# Patient Record
Sex: Male | Born: 1945
Health system: Southern US, Community
[De-identification: ages and names within clinical notes are randomized; demographics above are authoritative.]

## PROBLEM LIST (undated history)

## (undated) DIAGNOSIS — G4730 Sleep apnea, unspecified: Secondary | ICD-10-CM

## (undated) DIAGNOSIS — G4733 Obstructive sleep apnea (adult) (pediatric): Secondary | ICD-10-CM

## (undated) DIAGNOSIS — E8881 Metabolic syndrome: Secondary | ICD-10-CM

## (undated) DIAGNOSIS — N529 Male erectile dysfunction, unspecified: Secondary | ICD-10-CM

## (undated) DIAGNOSIS — G4700 Insomnia, unspecified: Secondary | ICD-10-CM

## (undated) DIAGNOSIS — R21 Rash and other nonspecific skin eruption: Secondary | ICD-10-CM

## (undated) DIAGNOSIS — Z9989 Dependence on other enabling machines and devices: Secondary | ICD-10-CM

## (undated) DIAGNOSIS — T7840XA Allergy, unspecified, initial encounter: Secondary | ICD-10-CM

## (undated) DIAGNOSIS — I10 Essential (primary) hypertension: Secondary | ICD-10-CM

## (undated) DIAGNOSIS — R519 Headache, unspecified: Secondary | ICD-10-CM

## (undated) DIAGNOSIS — R42 Dizziness and giddiness: Secondary | ICD-10-CM

## (undated) DIAGNOSIS — K219 Gastro-esophageal reflux disease without esophagitis: Secondary | ICD-10-CM

## (undated) DIAGNOSIS — B351 Tinea unguium: Secondary | ICD-10-CM

## (undated) DIAGNOSIS — R51 Headache: Secondary | ICD-10-CM

## (undated) DIAGNOSIS — E785 Hyperlipidemia, unspecified: Secondary | ICD-10-CM

## (undated) DIAGNOSIS — Z972 Presence of dental prosthetic device (complete) (partial): Secondary | ICD-10-CM

## (undated) HISTORY — PX: CARDIAC CATHETERIZATION: SHX172

## (undated) HISTORY — DX: Metabolic syndrome: E88.810

## (undated) HISTORY — DX: Essential (primary) hypertension: I10

## (undated) HISTORY — DX: Hyperlipidemia, unspecified: E78.5

## (undated) HISTORY — DX: Obstructive sleep apnea (adult) (pediatric): G47.33

## (undated) HISTORY — DX: Sleep apnea, unspecified: G47.30

## (undated) HISTORY — DX: Metabolic syndrome: E88.81

## (undated) HISTORY — DX: Dizziness and giddiness: R42

## (undated) HISTORY — DX: Allergy, unspecified, initial encounter: T78.40XA

## (undated) HISTORY — DX: Headache: R51

## (undated) HISTORY — DX: Rash and other nonspecific skin eruption: R21

## (undated) HISTORY — DX: Male erectile dysfunction, unspecified: N52.9

## (undated) HISTORY — DX: Headache, unspecified: R51.9

## (undated) HISTORY — PX: COLONOSCOPY: SHX174

## (undated) HISTORY — DX: Tinea unguium: B35.1

## (undated) HISTORY — DX: Insomnia, unspecified: G47.00

## (undated) HISTORY — DX: Dependence on other enabling machines and devices: Z99.89

## (undated) HISTORY — DX: Gastro-esophageal reflux disease without esophagitis: K21.9

---

## 2005-10-05 ENCOUNTER — Ambulatory Visit: Payer: Self-pay | Admitting: Family Medicine

## 2006-02-01 ENCOUNTER — Ambulatory Visit: Payer: Self-pay | Admitting: Family Medicine

## 2006-03-16 ENCOUNTER — Ambulatory Visit: Payer: Self-pay | Admitting: Gastroenterology

## 2006-06-06 ENCOUNTER — Ambulatory Visit: Payer: Self-pay | Admitting: Family Medicine

## 2006-09-06 ENCOUNTER — Ambulatory Visit: Payer: Self-pay | Admitting: Family Medicine

## 2006-12-06 ENCOUNTER — Ambulatory Visit: Payer: Self-pay | Admitting: Family Medicine

## 2007-01-13 ENCOUNTER — Other Ambulatory Visit: Payer: Self-pay

## 2007-01-13 ENCOUNTER — Emergency Department: Payer: Self-pay | Admitting: Emergency Medicine

## 2007-01-13 IMAGING — CT CT HEAD WITHOUT CONTRAST
2 series · 16 of 30 positions shown, 20 images · non-contrast
Comparison: none

REASON FOR EXAM: Syncope
COMMENTS:

[Series 2: without · axial · non-contrast · 0.45mm/px · z∈[-198,-78]mm · 13 of 30 slices shown, 17 images]
[im 3/30  brain]
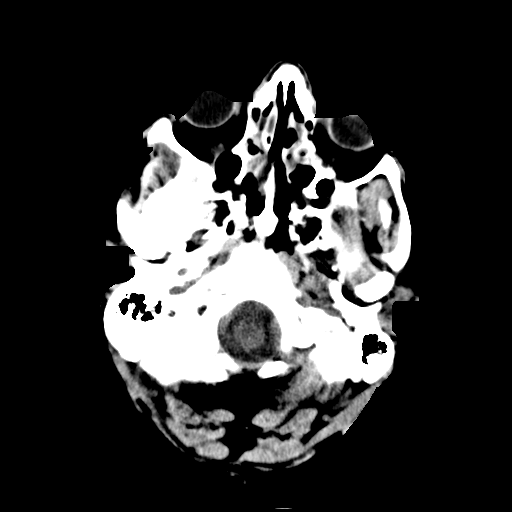
[im 3/30  bone]
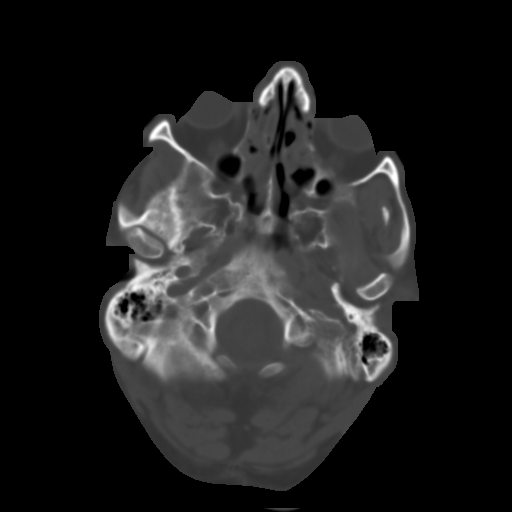
[im 5/30  brain]
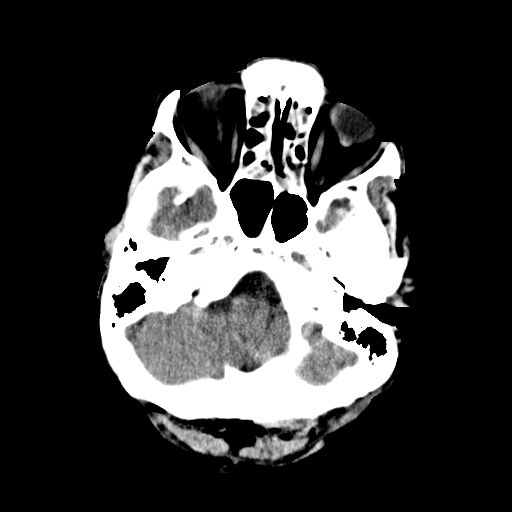
[im 7/30  brain]
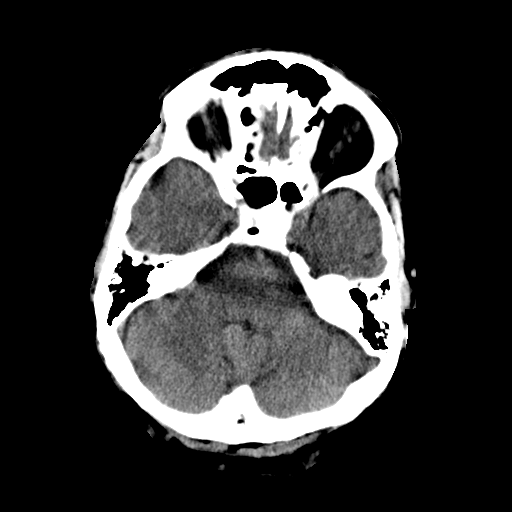
[im 9/30  brain]
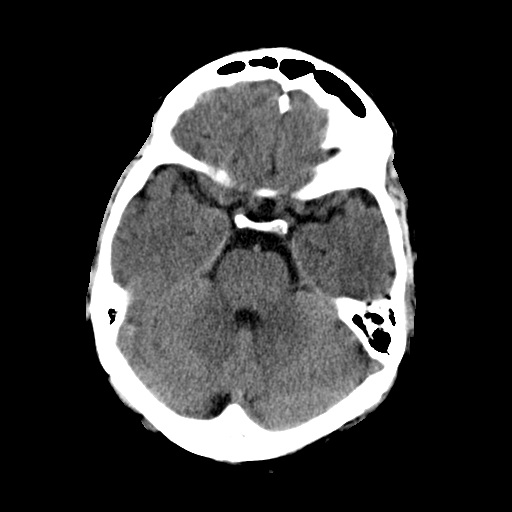
[im 11/30  brain]
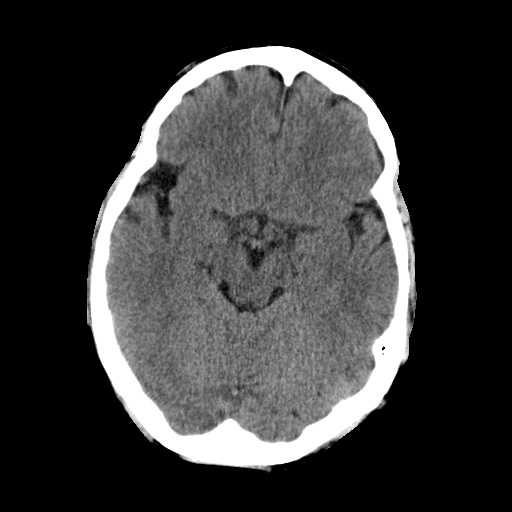
[im 11/30  bone]
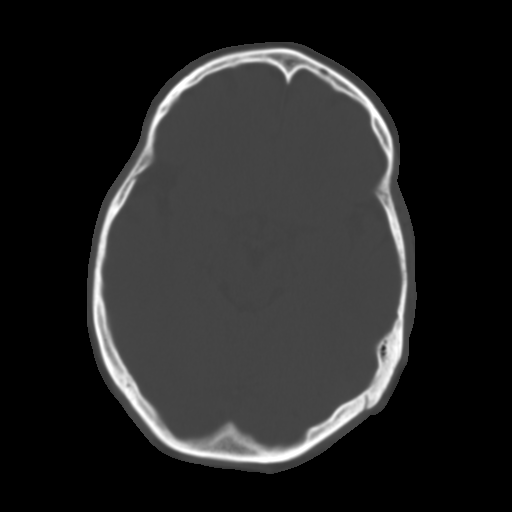
[im 13/30  brain]
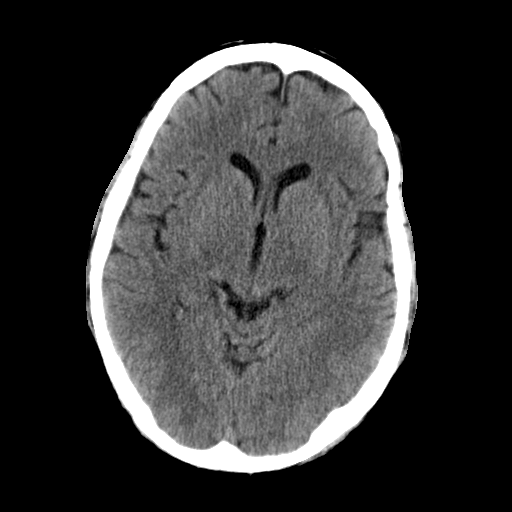
[im 15/30  brain]
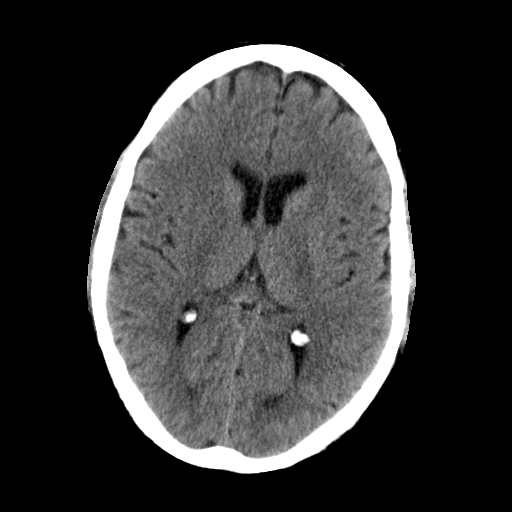
[im 17/30  brain]
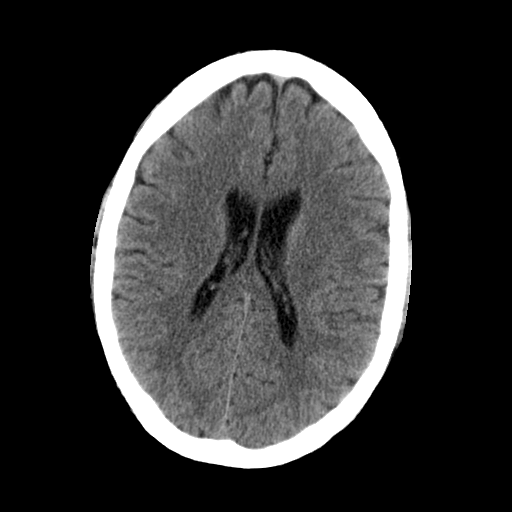
[im 19/30  brain]
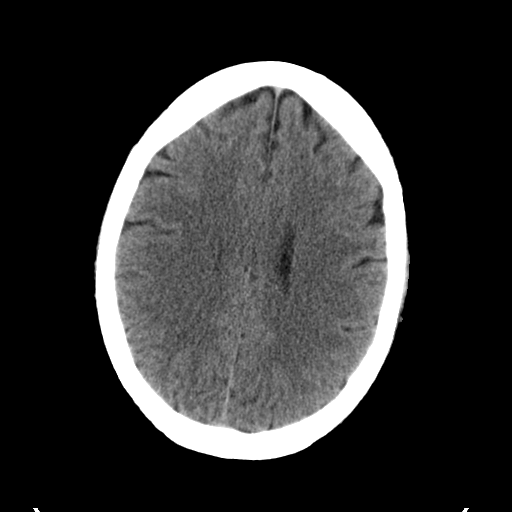
[im 19/30  bone]
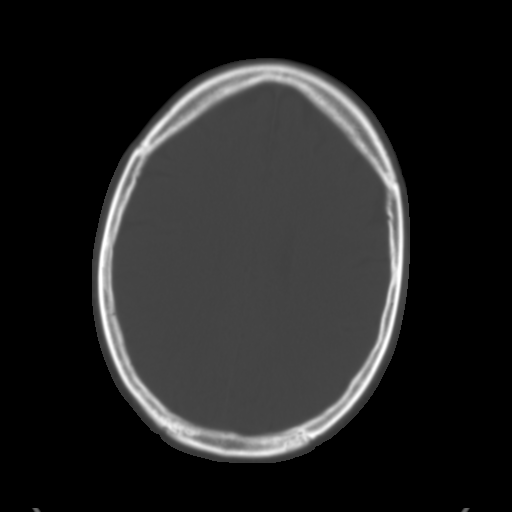
[im 21/30  brain]
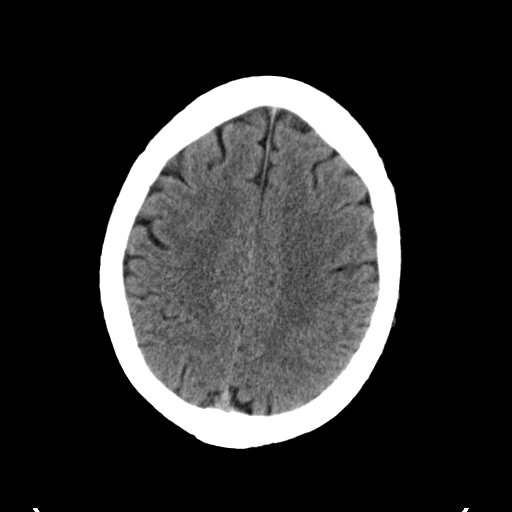
[im 23/30  brain]
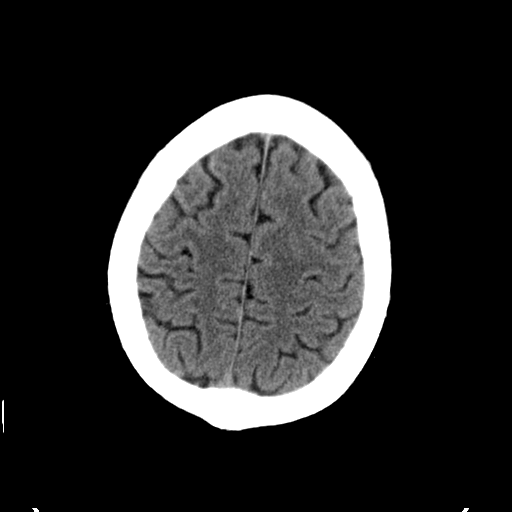
[im 25/30  brain]
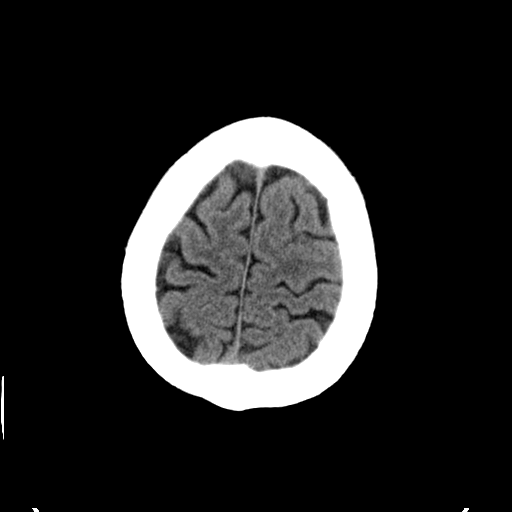
[im 27/30  brain]
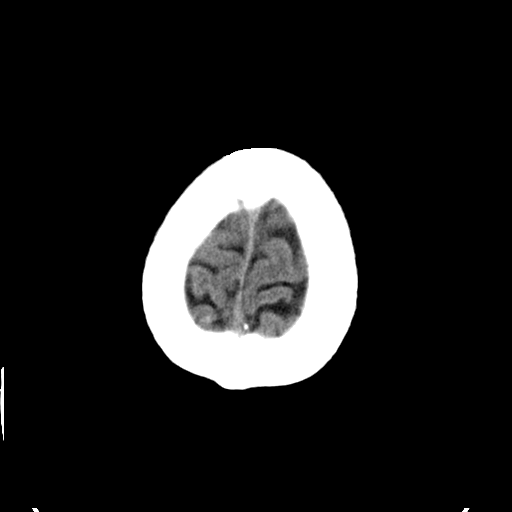
[im 27/30  bone]
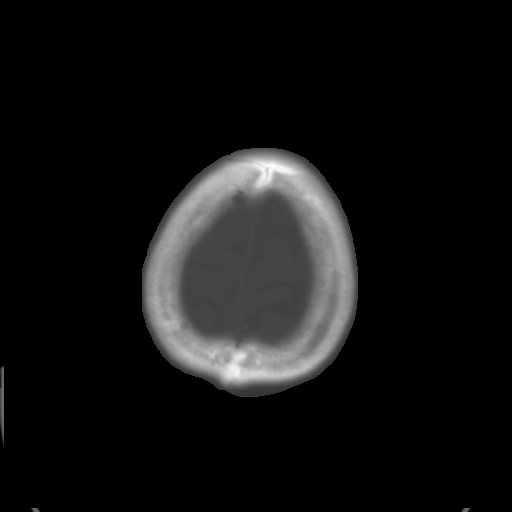

[Series 3: bone · axial · 0.45mm/px · z∈[-198,-158]mm · 3 of 30 slices shown]
[im 3/30  bone]
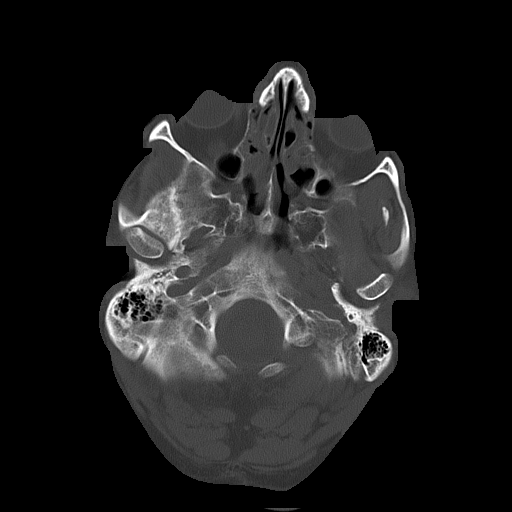
[im 7/30  bone]
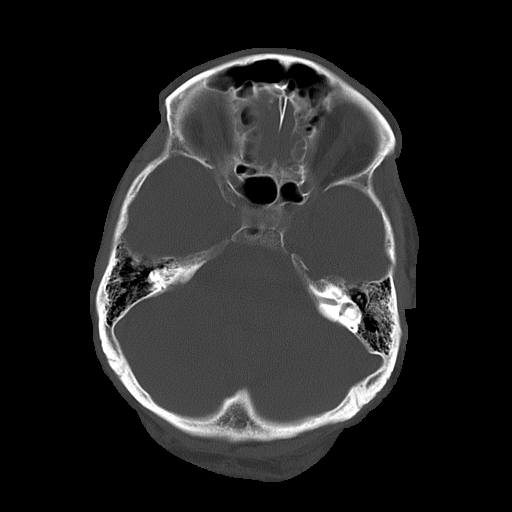
[im 11/30  bone]
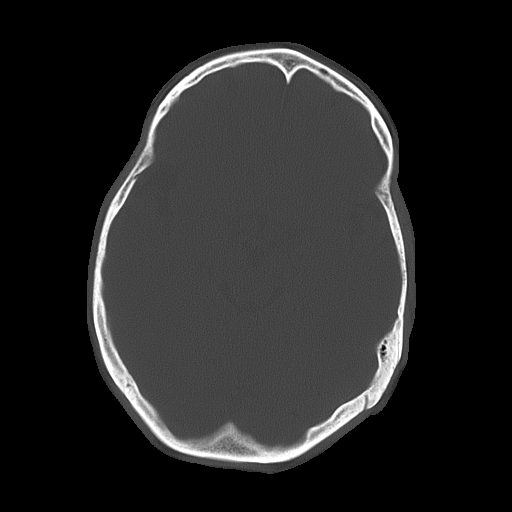

[16 of 30 positions shown; findings below may reference images not displayed]

PROCEDURE:     CT  - CT HEAD WITHOUT CONTRAST  - [DATE]  [DATE]

RESULT:     The ventricles are normal in size and position. There is no
intracranial hemorrhage, mass or mass effect. There is mucoperiosteal
thickening diffusely within the ethmoid sinuses. A small amount is seen in
the RIGHT sphenoid sinus cells. The maxillary sinuses are grossly clear
where visualized. The frontal sinuses are clear.
IMPRESSION: 1.  I see no acute intracranial abnormality.
2.  There are findings consistent with ethmoid and RIGHT sphenoid sinus
inflammation.

A preliminary report was sent to the [HOSPITAL] the conclusion
of the study.

## 2007-07-02 ENCOUNTER — Ambulatory Visit: Payer: Self-pay | Admitting: Family Medicine

## 2007-10-24 ENCOUNTER — Ambulatory Visit: Payer: Self-pay | Admitting: Family Medicine

## 2007-11-25 IMAGING — CR DG CHEST 1V PORT
1 series · 1 of 1 positions shown · non-contrast
Comparison: none

REASON FOR EXAM: syncope
COMMENTS:

[view not recorded]
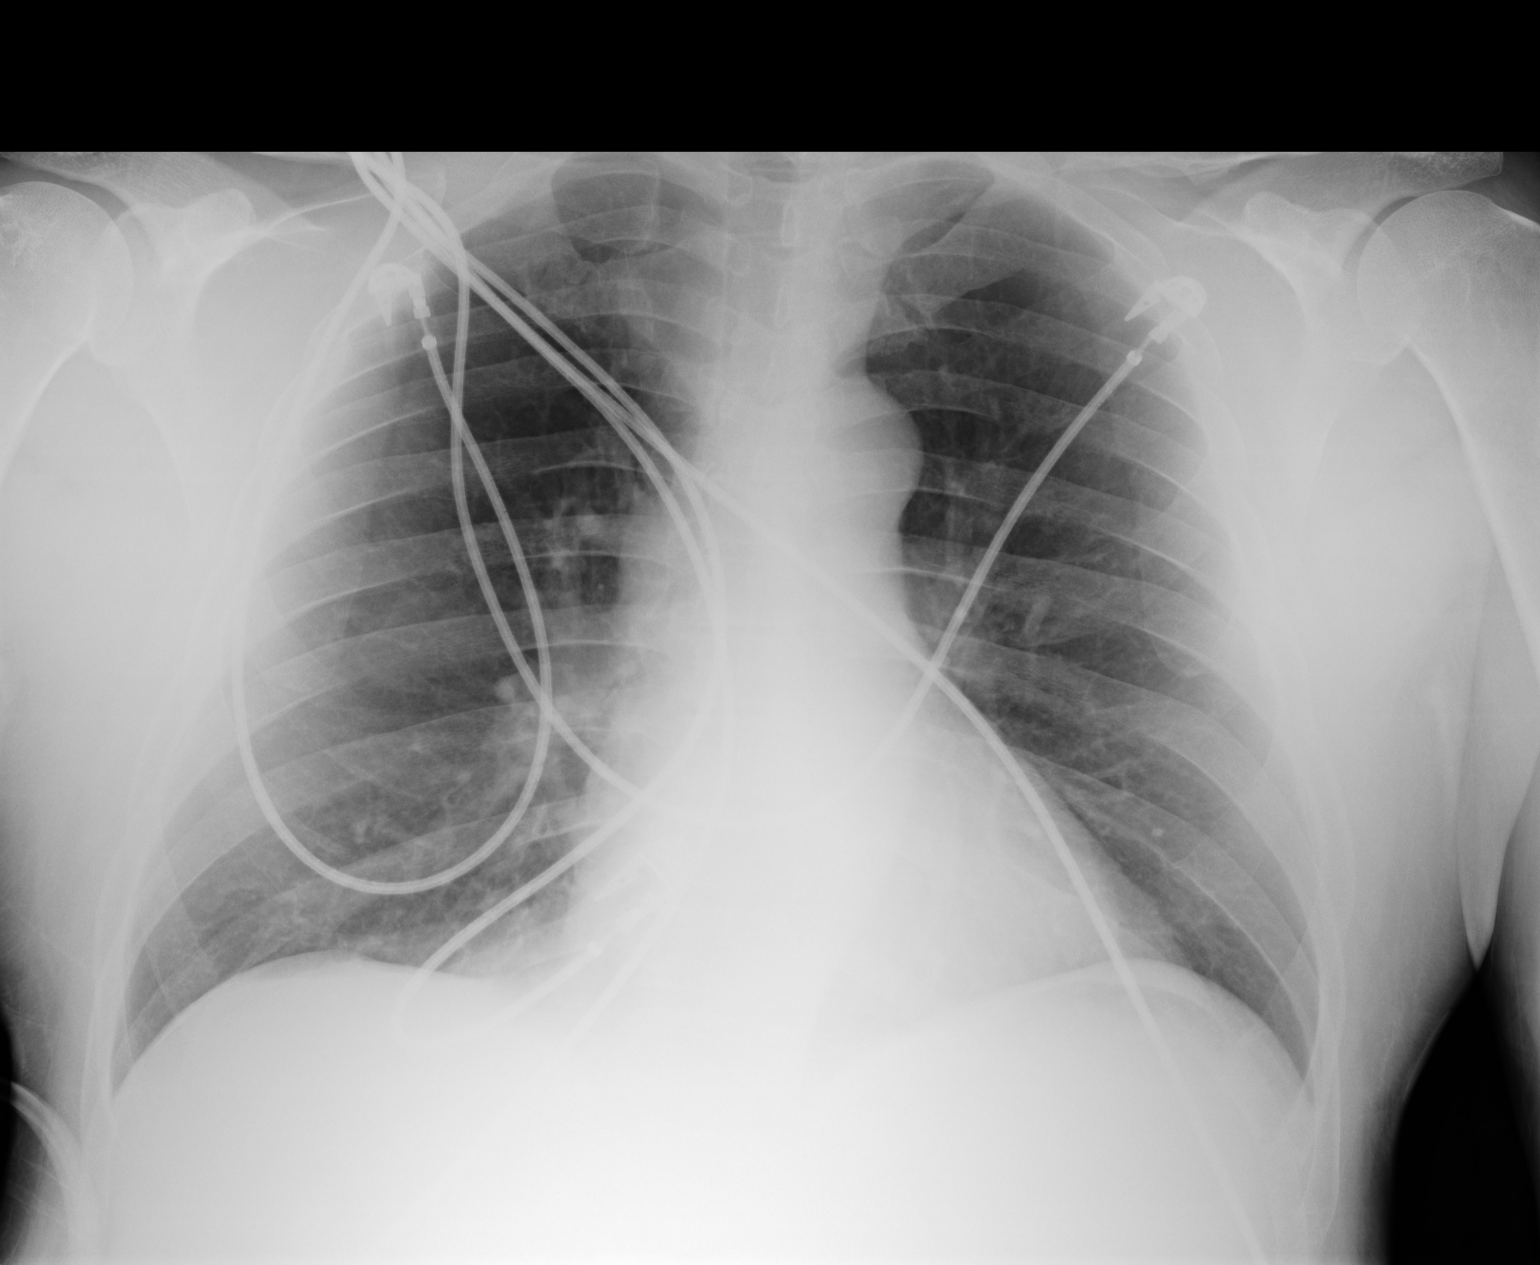

[1 of 1 positions shown; findings below may reference images not displayed]

PROCEDURE:     DXR - DXR PORTABLE CHEST SINGLE VIEW  - [DATE]  [DATE]

RESULT:     There are no prior images for comparison. Cardiac monitoring
electrodes are present. The lungs are clear. The heart and pulmonary vessels
are normal. The bony and mediastinal structures are unremarkable. There is
no effusion. There is no pneumothorax or evidence of congestive failure.
IMPRESSION: No acute cardiopulmonary disease.

## 2007-12-12 ENCOUNTER — Ambulatory Visit: Payer: Self-pay | Admitting: Gastroenterology

## 2008-01-24 ENCOUNTER — Ambulatory Visit: Payer: Self-pay | Admitting: Family Medicine

## 2008-04-29 ENCOUNTER — Ambulatory Visit: Payer: Self-pay | Admitting: Family Medicine

## 2008-06-12 ENCOUNTER — Emergency Department: Payer: Self-pay | Admitting: Emergency Medicine

## 2008-06-12 IMAGING — CR DG CHEST 1V PORT
1 series · 1 of 1 positions shown · non-contrast
Comparison: none

REASON FOR EXAM: chest pain
COMMENTS:

PROCEDURE:     DXR - DXR PORTABLE CHEST SINGLE VIEW  - [DATE] [DATE]
RESULT:     Comparison is made to prior study dated [DATE].
The lungs are clear. The cardiac silhouette and visualized bony skeleton are
unremarkable.

[view not recorded]
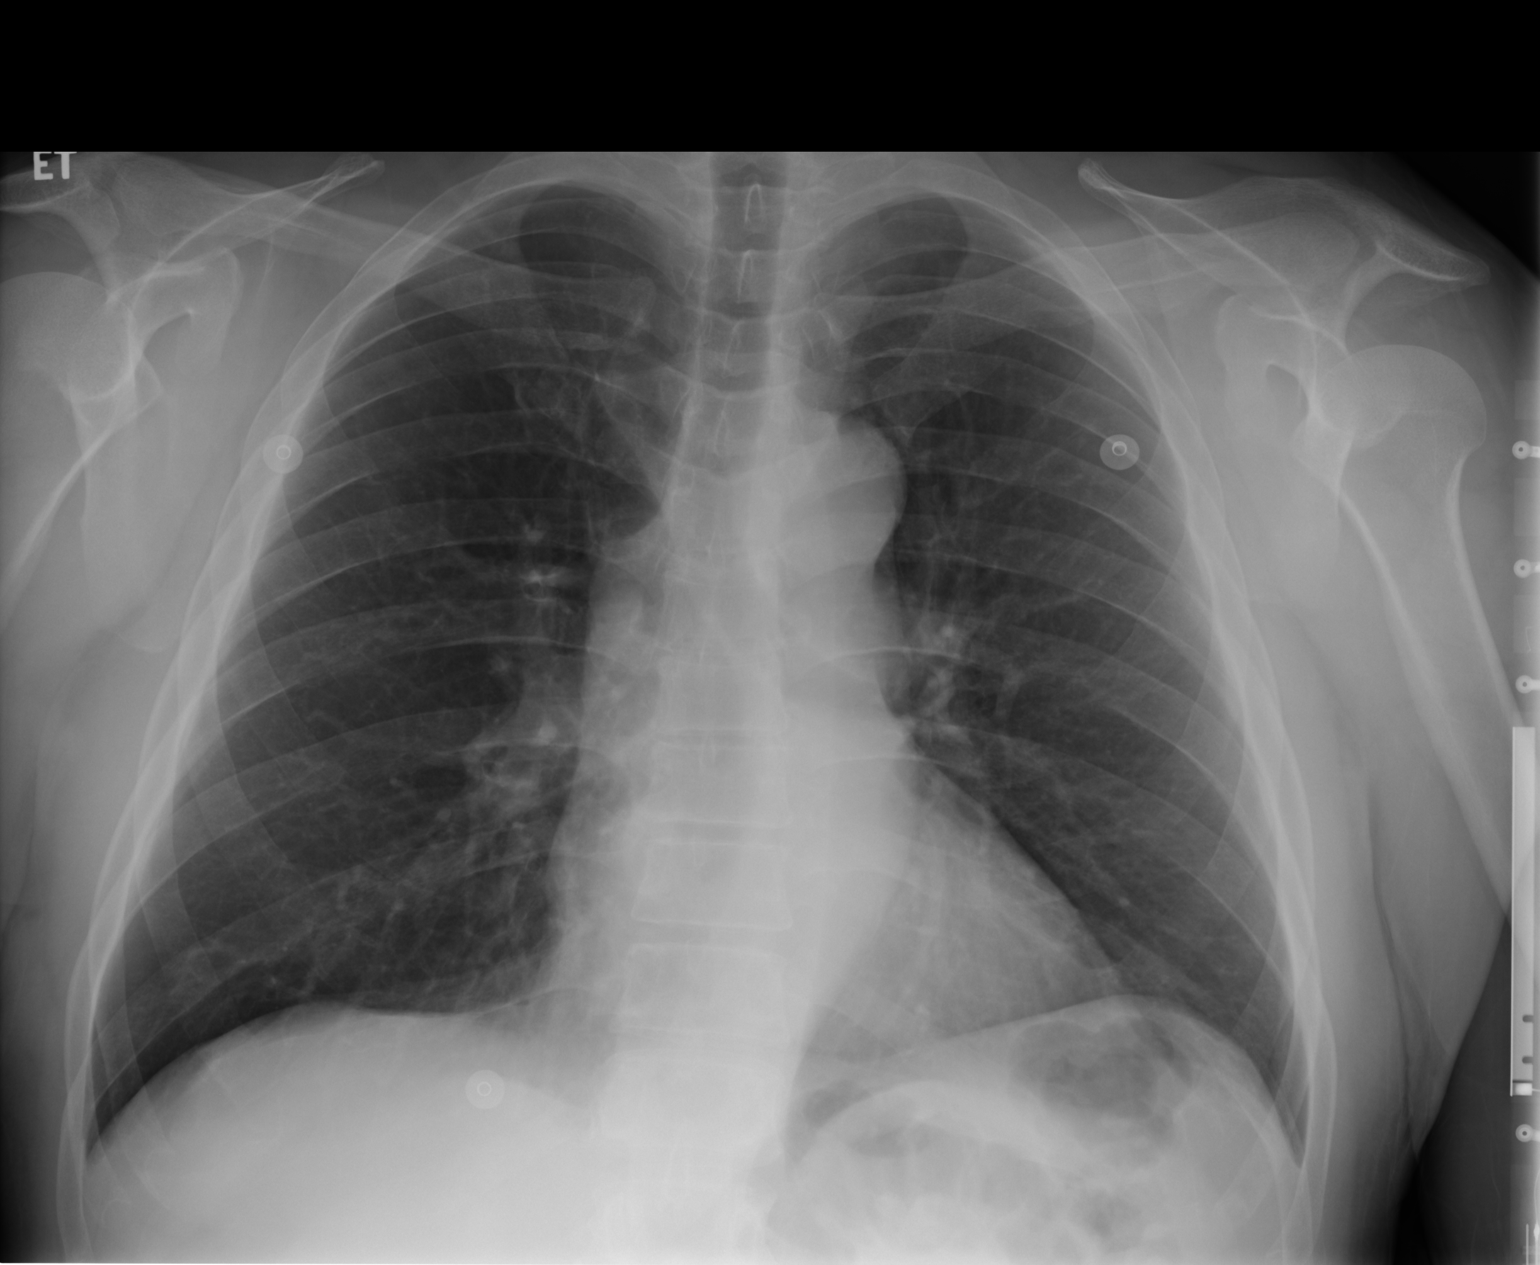

[1 of 1 positions shown; findings below may reference images not displayed]

IMPRESSION: 1. Chest radiograph without evidence of acute cardiopulmonary disease.

## 2008-07-24 ENCOUNTER — Ambulatory Visit: Payer: Self-pay | Admitting: Cardiovascular Disease

## 2008-12-07 ENCOUNTER — Other Ambulatory Visit: Payer: Self-pay | Admitting: Family Medicine

## 2009-04-13 ENCOUNTER — Other Ambulatory Visit: Payer: Self-pay | Admitting: Family Medicine

## 2009-10-13 ENCOUNTER — Other Ambulatory Visit: Payer: Self-pay | Admitting: Family Medicine

## 2010-02-16 ENCOUNTER — Other Ambulatory Visit: Payer: Self-pay | Admitting: Family Medicine

## 2010-05-27 ENCOUNTER — Other Ambulatory Visit: Payer: Self-pay | Admitting: Family Medicine

## 2010-11-02 ENCOUNTER — Other Ambulatory Visit: Payer: Self-pay | Admitting: Family Medicine

## 2010-11-25 ENCOUNTER — Ambulatory Visit: Payer: Self-pay | Admitting: Internal Medicine

## 2010-11-27 ENCOUNTER — Ambulatory Visit: Payer: Self-pay | Admitting: Internal Medicine

## 2010-12-28 ENCOUNTER — Ambulatory Visit: Payer: Self-pay | Admitting: Gastroenterology

## 2010-12-28 ENCOUNTER — Ambulatory Visit: Payer: Self-pay

## 2010-12-28 ENCOUNTER — Ambulatory Visit: Payer: Self-pay | Admitting: Internal Medicine

## 2010-12-28 LAB — HM COLONOSCOPY: HM Colonoscopy: NORMAL

## 2010-12-30 LAB — PATHOLOGY REPORT

## 2011-03-07 ENCOUNTER — Ambulatory Visit: Payer: Self-pay | Admitting: Internal Medicine

## 2011-03-27 ENCOUNTER — Ambulatory Visit: Payer: Self-pay | Admitting: Gastroenterology

## 2011-03-30 ENCOUNTER — Ambulatory Visit: Payer: Self-pay | Admitting: Internal Medicine

## 2011-07-28 ENCOUNTER — Other Ambulatory Visit: Payer: Self-pay | Admitting: Family Medicine

## 2011-07-28 LAB — CBC WITH DIFFERENTIAL/PLATELET
Basophil #: 0 10*3/uL (ref 0.0–0.1)
Basophil %: 0.3 %
Eosinophil #: 0.2 10*3/uL (ref 0.0–0.7)
Eosinophil %: 2.5 %
HCT: 42.5 % (ref 40.0–52.0)
HGB: 13.7 g/dL (ref 13.0–18.0)
Lymphocyte #: 1.7 10*3/uL (ref 1.0–3.6)
Lymphocyte %: 26.1 %
MCH: 28.4 pg (ref 26.0–34.0)
MCHC: 32.3 g/dL (ref 32.0–36.0)
MCV: 88 fL (ref 80–100)
Monocyte #: 0.7 10*3/uL (ref 0.0–0.7)
Monocyte %: 10.4 %
Neutrophil #: 3.9 10*3/uL (ref 1.4–6.5)
Neutrophil %: 60.7 %
RBC: 4.83 10*6/uL (ref 4.40–5.90)
WBC: 6.4 10*3/uL (ref 3.8–10.6)

## 2011-07-28 LAB — COMPREHENSIVE METABOLIC PANEL
Albumin: 4.1 g/dL (ref 3.4–5.0)
Anion Gap: 11 (ref 7–16)
Calcium, Total: 9.5 mg/dL (ref 8.5–10.1)
Chloride: 104 mmol/L (ref 98–107)
Co2: 26 mmol/L (ref 21–32)
Creatinine: 1.03 mg/dL (ref 0.60–1.30)
EGFR (African American): >60
Osmolality: 285 (ref 275–301)
Potassium: 4.1 mmol/L (ref 3.5–5.1)
Sodium: 141 mmol/L (ref 136–145)
Total Protein: 7.8 g/dL (ref 6.4–8.2)

## 2011-07-28 LAB — LIPID PANEL
HDL Cholesterol: 65 mg/dL — ABNORMAL HIGH (ref 40–60)
Triglycerides: 50 mg/dL (ref 0–200)
VLDL Cholesterol, Calc: 10 mg/dL (ref 5–40)

## 2011-07-28 LAB — HEMOGLOBIN A1C: Hemoglobin A1C: 6.1 % (ref 4.2–6.3)

## 2012-04-01 HISTORY — PX: HEMORRHOID BANDING: SHX5850

## 2012-08-29 ENCOUNTER — Other Ambulatory Visit: Payer: Self-pay | Admitting: Family Medicine

## 2012-08-29 LAB — COMPREHENSIVE METABOLIC PANEL WITH GFR
Albumin: 3.9 g/dL (ref 3.4–5.0)
Alkaline Phosphatase: 111 U/L (ref 50–136)
Anion Gap: 4 — ABNORMAL LOW (ref 7–16)
BUN: 24 mg/dL — ABNORMAL HIGH (ref 7–18)
Bilirubin,Total: 0.4 mg/dL (ref 0.2–1.0)
Calcium, Total: 9.1 mg/dL (ref 8.5–10.1)
Chloride: 105 mmol/L (ref 98–107)
Co2: 28 mmol/L (ref 21–32)
Creatinine: 1.16 mg/dL (ref 0.60–1.30)
Glucose: 97 mg/dL (ref 65–99)
Osmolality: 278 (ref 275–301)
Potassium: 4.2 mmol/L (ref 3.5–5.1)
SGOT(AST): 24 U/L (ref 15–37)
SGPT (ALT): 32 U/L (ref 12–78)
Sodium: 137 mmol/L (ref 136–145)
Total Protein: 7.4 g/dL (ref 6.4–8.2)

## 2012-08-29 LAB — CBC WITH DIFFERENTIAL/PLATELET
Basophil #: 0 10*3/uL (ref 0.0–0.1)
Basophil %: 0.7 %
Eosinophil #: 0.1 10*3/uL (ref 0.0–0.7)
HCT: 44.6 % (ref 40.0–52.0)
HGB: 14.5 g/dL (ref 13.0–18.0)
MCH: 27.9 pg (ref 26.0–34.0)
MCHC: 32.5 g/dL (ref 32.0–36.0)
MCV: 86 fL (ref 80–100)

## 2012-08-29 LAB — FERRITIN: Ferritin (ARMC): 26 ng/mL (ref 8–388)

## 2012-08-29 LAB — LIPID PANEL
Cholesterol: 152 mg/dL (ref 0–200)
HDL Cholesterol: 64 mg/dL — ABNORMAL HIGH (ref 40–60)
Ldl Cholesterol, Calc: 78 mg/dL (ref 0–100)
Triglycerides: 48 mg/dL (ref 0–200)
VLDL Cholesterol, Calc: 10 mg/dL (ref 5–40)

## 2013-08-29 DIAGNOSIS — K219 Gastro-esophageal reflux disease without esophagitis: Secondary | ICD-10-CM | POA: Diagnosis not present

## 2013-08-29 DIAGNOSIS — I10 Essential (primary) hypertension: Secondary | ICD-10-CM | POA: Diagnosis not present

## 2013-08-29 DIAGNOSIS — E785 Hyperlipidemia, unspecified: Secondary | ICD-10-CM | POA: Diagnosis not present

## 2013-08-29 DIAGNOSIS — E8881 Metabolic syndrome: Secondary | ICD-10-CM | POA: Diagnosis not present

## 2013-08-29 LAB — HEMOGLOBIN A1C: HEMOGLOBIN A1C: 6 % (ref 4.0–6.0)

## 2013-10-17 LAB — PSA: PSA: NORMAL

## 2013-11-06 DIAGNOSIS — Z1211 Encounter for screening for malignant neoplasm of colon: Secondary | ICD-10-CM | POA: Diagnosis not present

## 2013-11-06 DIAGNOSIS — Z1331 Encounter for screening for depression: Secondary | ICD-10-CM | POA: Diagnosis not present

## 2013-11-06 DIAGNOSIS — Z0000 Encounter for general adult medical examination without abnormal findings: Secondary | ICD-10-CM | POA: Diagnosis not present

## 2013-11-06 DIAGNOSIS — Z125 Encounter for screening for malignant neoplasm of prostate: Secondary | ICD-10-CM | POA: Diagnosis not present

## 2013-12-17 DIAGNOSIS — J309 Allergic rhinitis, unspecified: Secondary | ICD-10-CM | POA: Diagnosis not present

## 2013-12-17 DIAGNOSIS — Z1211 Encounter for screening for malignant neoplasm of colon: Secondary | ICD-10-CM | POA: Diagnosis not present

## 2013-12-17 DIAGNOSIS — K219 Gastro-esophageal reflux disease without esophagitis: Secondary | ICD-10-CM | POA: Diagnosis not present

## 2013-12-17 DIAGNOSIS — E785 Hyperlipidemia, unspecified: Secondary | ICD-10-CM | POA: Diagnosis not present

## 2013-12-17 DIAGNOSIS — Z23 Encounter for immunization: Secondary | ICD-10-CM | POA: Diagnosis not present

## 2013-12-17 DIAGNOSIS — D649 Anemia, unspecified: Secondary | ICD-10-CM | POA: Diagnosis not present

## 2013-12-17 DIAGNOSIS — I10 Essential (primary) hypertension: Secondary | ICD-10-CM | POA: Diagnosis not present

## 2013-12-17 LAB — LIPID PANEL
Cholesterol: 178 mg/dL (ref 0–200)
HDL: 72 mg/dL — AB (ref 35–70)
LDL CALC: 97 mg/dL
Triglycerides: 46 mg/dL (ref 40–160)

## 2013-12-19 DIAGNOSIS — Z23 Encounter for immunization: Secondary | ICD-10-CM | POA: Diagnosis not present

## 2014-03-02 DIAGNOSIS — E669 Obesity, unspecified: Secondary | ICD-10-CM | POA: Diagnosis not present

## 2014-03-02 DIAGNOSIS — E8881 Metabolic syndrome: Secondary | ICD-10-CM | POA: Diagnosis not present

## 2014-03-02 DIAGNOSIS — Z1159 Encounter for screening for other viral diseases: Secondary | ICD-10-CM | POA: Diagnosis not present

## 2014-03-02 DIAGNOSIS — Z23 Encounter for immunization: Secondary | ICD-10-CM | POA: Diagnosis not present

## 2014-03-02 DIAGNOSIS — I10 Essential (primary) hypertension: Secondary | ICD-10-CM | POA: Diagnosis not present

## 2014-03-02 DIAGNOSIS — K219 Gastro-esophageal reflux disease without esophagitis: Secondary | ICD-10-CM | POA: Diagnosis not present

## 2014-03-02 DIAGNOSIS — R946 Abnormal results of thyroid function studies: Secondary | ICD-10-CM | POA: Diagnosis not present

## 2014-03-02 DIAGNOSIS — K649 Unspecified hemorrhoids: Secondary | ICD-10-CM | POA: Diagnosis not present

## 2014-03-02 DIAGNOSIS — E785 Hyperlipidemia, unspecified: Secondary | ICD-10-CM | POA: Diagnosis not present

## 2014-05-15 DIAGNOSIS — R946 Abnormal results of thyroid function studies: Secondary | ICD-10-CM | POA: Diagnosis not present

## 2015-01-07 ENCOUNTER — Ambulatory Visit (INDEPENDENT_AMBULATORY_CARE_PROVIDER_SITE_OTHER): Payer: PPO | Admitting: Family Medicine

## 2015-01-07 ENCOUNTER — Encounter: Payer: Self-pay | Admitting: Family Medicine

## 2015-01-07 VITALS — BP 116/84 | HR 80 | Temp 98.5°F | Resp 16 | Ht 67.0 in | Wt 206.8 lb

## 2015-01-07 DIAGNOSIS — J309 Allergic rhinitis, unspecified: Secondary | ICD-10-CM | POA: Insufficient documentation

## 2015-01-07 DIAGNOSIS — Z9889 Other specified postprocedural states: Secondary | ICD-10-CM | POA: Insufficient documentation

## 2015-01-07 DIAGNOSIS — E66811 Obesity, class 1: Secondary | ICD-10-CM

## 2015-01-07 DIAGNOSIS — E669 Obesity, unspecified: Secondary | ICD-10-CM

## 2015-01-07 DIAGNOSIS — K219 Gastro-esophageal reflux disease without esophagitis: Secondary | ICD-10-CM | POA: Diagnosis not present

## 2015-01-07 DIAGNOSIS — E785 Hyperlipidemia, unspecified: Secondary | ICD-10-CM | POA: Diagnosis not present

## 2015-01-07 DIAGNOSIS — E8881 Metabolic syndrome: Secondary | ICD-10-CM | POA: Diagnosis not present

## 2015-01-07 DIAGNOSIS — Z23 Encounter for immunization: Secondary | ICD-10-CM | POA: Diagnosis not present

## 2015-01-07 DIAGNOSIS — G4733 Obstructive sleep apnea (adult) (pediatric): Secondary | ICD-10-CM | POA: Diagnosis not present

## 2015-01-07 DIAGNOSIS — I10 Essential (primary) hypertension: Secondary | ICD-10-CM

## 2015-01-07 DIAGNOSIS — Z862 Personal history of diseases of the blood and blood-forming organs and certain disorders involving the immune mechanism: Secondary | ICD-10-CM

## 2015-01-07 DIAGNOSIS — R946 Abnormal results of thyroid function studies: Secondary | ICD-10-CM

## 2015-01-07 DIAGNOSIS — R739 Hyperglycemia, unspecified: Secondary | ICD-10-CM

## 2015-01-07 DIAGNOSIS — Z9989 Dependence on other enabling machines and devices: Secondary | ICD-10-CM

## 2015-01-07 DIAGNOSIS — R7989 Other specified abnormal findings of blood chemistry: Secondary | ICD-10-CM | POA: Insufficient documentation

## 2015-01-07 MED ORDER — PRAVASTATIN SODIUM 40 MG PO TABS: 40.0000 mg | ORAL_TABLET | Freq: Every day | ORAL | Status: DC

## 2015-01-07 MED ORDER — OMEPRAZOLE 40 MG PO CPDR: 40.0000 mg | DELAYED_RELEASE_CAPSULE | Freq: Every day | ORAL | Status: DC

## 2015-01-07 MED ORDER — HYDROCHLOROTHIAZIDE 12.5 MG PO CAPS: 12.5000 mg | ORAL_CAPSULE | Freq: Every day | ORAL | Status: DC

## 2015-01-07 MED ORDER — RANITIDINE HCL 300 MG PO TABS: 300.0000 mg | ORAL_TABLET | Freq: Two times a day (BID) | ORAL | Status: DC

## 2015-01-07 MED ORDER — ZOSTER VACCINE LIVE 19400 UNT/0.65ML ~~LOC~~ SOLR: 0.6500 mL | Freq: Once | SUBCUTANEOUS | Status: DC

## 2015-01-07 NOTE — Progress Notes (Signed)
Name: Dustin Cobb   MRN: [id number]    DOB: 22-Jun-1945   Date:01/07/2015       Progress Note  Subjective  Chief Complaint  Chief Complaint  Patient presents with  . Medication Refill    6 month F/U  . Hypertension    no problems  . Gastrophageal Reflux    unchanged  . Hyperlipidemia    muscle soreness  . Allergic Rhinitis     worsening-when cutting grass-taking Flonase and Zytrec OTC-runny nose, cough, sneezing.    HPI  HTN: taking HCTZ and bp is great , denies dizziness, no chest pain or palpitation  GERD: taking Omeprazole and Ranitidine and symptoms of indigestion still occurs but at most once a week, when he does not follow a GERD diet  Hyperlipidemia: he has been taking Pravastatin and denies side effects of medication  Metabolic Syndrome: trying to follow a low carbohydrate diet  Low TSH: seen by Endo and has follow up this Fall  OSA : wearing CPAP most nights of the week for at least 5 hours. He denies waking up with a headache, he denies feeling sleepy during the day  Patient Active Problem List   Diagnosis Date Noted  . Hypertension, essential, benign 01/07/2015  . GERD (gastroesophageal reflux disease) 01/07/2015  . OSA on CPAP 01/07/2015  . Low TSH level 01/07/2015  . Hyperglycemia 01/07/2015  . Obesity (BMI 30.0-34.9) 01/07/2015  . Dyslipidemia 01/07/2015  . History of hemorrhoidectomy 01/07/2015  . History of iron deficiency anemia 01/07/2015  . Allergic rhinitis 01/07/2015  . Metabolic syndrome 17/40/8144    Past Surgical History  Procedure Laterality Date  . Hemorrhoid banding  04/01/12    Dr. Fleet Contras    Family History  Problem Relation Age of Onset  . Cancer Mother     Thyroid  . Emphysema Father   . Hypertension Sister   . Hypertension Brother     Social History   Social History  . Marital Status: Married    Spouse Name: N/A  . Number of Children: N/A  . Years of Education: N/A   Occupational History  . Not on file.    Social History Main Topics  . Smoking status: Former Smoker -- 1.00 packs/day for 20 years    Types: Cigarettes    Start date: 01/07/1975    Quit date: 01/07/1995  . Smokeless tobacco: Never Used  . Alcohol Use: No  . Drug Use: No  . Sexual Activity:    Partners: Female   Other Topics Concern  . Not on file   Social History Narrative     Current outpatient prescriptions:  .  aspirin 81 MG chewable tablet, Chew 1 tablet by mouth daily., Disp: , Rfl:  .  Cholecalciferol (VITAMIN D3) 1000 UNITS CAPS, Take 1 capsule by mouth daily., Disp: , Rfl:  .  ferrous sulfate 325 (65 FE) MG tablet, Take 1 tablet by mouth daily., Disp: , Rfl:  .  hydrochlorothiazide (MICROZIDE) 12.5 MG capsule, Take 1 capsule (12.5 mg total) by mouth daily., Disp: 90 capsule, Rfl: 1 .  omeprazole (PRILOSEC) 40 MG capsule, Take 1 capsule (40 mg total) by mouth daily., Disp: 90 capsule, Rfl: 1 .  pravastatin (PRAVACHOL) 40 MG tablet, Take 1 tablet (40 mg total) by mouth daily., Disp: 90 tablet, Rfl: 1 .  ranitidine (ZANTAC) 300 MG tablet, Take 1 tablet (300 mg total) by mouth 2 (two) times daily., Disp: 180 tablet, Rfl: 1 .  zoster vaccine live, PF, (ZOSTAVAX)  19400 UNT/0.65ML injection, Inject 19,400 Units into the skin once., Disp: 1 each, Rfl: 0  No Known Allergies   ROS  Constitutional: Negative for fever or weight change.  Respiratory: Negative for cough and shortness of breath.   Cardiovascular: Negative for chest pain or palpitations.  Gastrointestinal: Negative for abdominal pain, no bowel changes.  Musculoskeletal: Negative for gait problem or joint swelling.  Skin: Negative for rash.  Neurological: Negative for dizziness or headache.  No other specific complaints in a complete review of systems (except as listed in HPI above).  Objective  Filed Vitals:   01/07/15 1014  BP: 116/84  Pulse: 80  Temp: 98.5 F (36.9 C)  TempSrc: Oral  Resp: 16  Height: 5\' 7"  (1.702 m)  Weight: 206 lb 12.8  oz (93.804 kg)  SpO2: 99%    Body mass index is 32.38 kg/(m^2).  Physical Exam  Constitutional: Patient appears well-developed and well-nourished. Obese No distress.  HEENT: head atraumatic, normocephalic, pupils equal and reactive to light,  neck supple, throat within normal limits, no thyromegaly Cardiovascular: Normal rate, regular rhythm and normal heart sounds.  No murmur heard. No BLE edema. Pulmonary/Chest: Effort normal and breath sounds normal. No respiratory distress. Abdominal: Soft.  There is no tenderness. Psychiatric: Patient has a normal mood and affect. behavior is normal. Judgment and thought content normal.    PHQ2/9: Depression screen PHQ 2/9 01/07/2015  Decreased Interest 0  Down, Depressed, Hopeless 0  PHQ - 2 Score 0    Fall Risk: Fall Risk  01/07/2015  Falls in the past year? Yes  Number falls in past yr: 1  Injury with Fall? No     Assessment & Plan  1. Hypertension, essential, benign We may dc medication if he develops dizziness/orthostatic changes - hydrochlorothiazide (MICROZIDE) 12.5 MG capsule; Take 1 capsule (12.5 mg total) by mouth daily.  Dispense: 90 capsule; Refill: 1 - Comprehensive Metabolic Panel (CMET)  2. Gastroesophageal reflux disease without esophagitis Discussed importance of GERD diet and possible side effects of medication - ranitidine (ZANTAC) 300 MG tablet; Take 1 tablet (300 mg total) by mouth 2 (two) times daily.  Dispense: 180 tablet; Refill: 1 - omeprazole (PRILOSEC) 40 MG capsule; Take 1 capsule (40 mg total) by mouth daily.  Dispense: 90 capsule; Refill: 1  3. OSA on CPAP Continue CPAP use  4. Low TSH level Continue follow up with Endo  5. Hyperglycemia  - HgB A1c  6. Obesity (BMI 30.0-34.9) Discussed diet and exercise  7. Dyslipidemia  - pravastatin (PRAVACHOL) 40 MG tablet; Take 1 tablet (40 mg total) by mouth daily.  Dispense: 90 tablet; Refill: 1 - Lipid Profile  8. History of iron deficiency  anemia  - ferrous sulfate 325 (65 FE) MG tablet; Take 1 tablet by mouth daily. - Hematocrit  9. Metabolic syndrome Discussed diet again  10. Need for pneumococcal vaccination  - Pneumococcal conjugate vaccine 13-valent  11. Needs flu shot  - Flu Vaccine QUAD 36+ mos PF IM (Fluarix Quad PF)  12. Need for shingles vaccine  - zoster vaccine live, PF, (ZOSTAVAX) 10626 UNT/0.65ML injection; Inject 19,400 Units into the skin once.  Dispense: 1 each; Refill: 0

## 2015-01-12 LAB — COMPREHENSIVE METABOLIC PANEL
A/G RATIO: 1.6 (ref 1.1–2.5)
ALK PHOS: 93 IU/L (ref 39–117)
ALT: 25 IU/L (ref 0–44)
AST: 29 IU/L (ref 0–40)
Albumin: 4.2 g/dL (ref 3.6–4.8)
BUN/Creatinine Ratio: 21 (ref 10–22)
BUN: 23 mg/dL (ref 8–27)
Bilirubin Total: 0.7 mg/dL (ref 0.0–1.2)
CHLORIDE: 103 mmol/L (ref 97–108)
CO2: 23 mmol/L (ref 18–29)
Calcium: 9.7 mg/dL (ref 8.6–10.2)
Creatinine, Ser: 1.12 mg/dL (ref 0.76–1.27)
GFR calc Af Amer: 78 mL/min/{1.73_m2} (ref >59)
GFR, EST NON AFRICAN AMERICAN: 67 mL/min/{1.73_m2} (ref >59)
Globulin, Total: 2.6 g/dL (ref 1.5–4.5)
Glucose: 106 mg/dL — ABNORMAL HIGH (ref 65–99)
POTASSIUM: 4.1 mmol/L (ref 3.5–5.2)
SODIUM: 142 mmol/L (ref 134–144)
Total Protein: 6.8 g/dL (ref 6.0–8.5)

## 2015-01-12 LAB — LIPID PANEL
CHOL/HDL RATIO: 2.4 ratio (ref 0.0–5.0)
Cholesterol, Total: 166 mg/dL (ref 100–199)
HDL: 68 mg/dL (ref >39)
LDL CALC: 89 mg/dL (ref 0–99)
Triglycerides: 43 mg/dL (ref 0–149)
VLDL Cholesterol Cal: 9 mg/dL (ref 5–40)

## 2015-01-12 LAB — HEMOGLOBIN A1C
ESTIMATED AVERAGE GLUCOSE: 131 mg/dL
HEMOGLOBIN A1C: 6.2 % — AB (ref 4.8–5.6)

## 2015-01-12 LAB — HEMATOCRIT: Hematocrit: 44.1 % (ref 37.5–51.0)

## 2015-01-12 NOTE — Progress Notes (Signed)
Patient notified

## 2015-06-05 DIAGNOSIS — G4733 Obstructive sleep apnea (adult) (pediatric): Secondary | ICD-10-CM | POA: Diagnosis not present

## 2015-06-23 DIAGNOSIS — R946 Abnormal results of thyroid function studies: Secondary | ICD-10-CM | POA: Diagnosis not present

## 2015-07-06 DIAGNOSIS — G4733 Obstructive sleep apnea (adult) (pediatric): Secondary | ICD-10-CM | POA: Diagnosis not present

## 2015-07-09 DIAGNOSIS — E669 Obesity, unspecified: Secondary | ICD-10-CM | POA: Diagnosis not present

## 2015-07-09 DIAGNOSIS — G4733 Obstructive sleep apnea (adult) (pediatric): Secondary | ICD-10-CM | POA: Diagnosis not present

## 2015-07-13 ENCOUNTER — Encounter: Payer: PPO | Admitting: Family Medicine

## 2015-07-13 ENCOUNTER — Other Ambulatory Visit: Payer: Self-pay | Admitting: Family Medicine

## 2015-07-13 DIAGNOSIS — R946 Abnormal results of thyroid function studies: Secondary | ICD-10-CM | POA: Diagnosis not present

## 2015-07-13 NOTE — Telephone Encounter (Signed)
Patient requesting refill. 

## 2015-07-22 ENCOUNTER — Encounter: Payer: Self-pay | Admitting: Family Medicine

## 2015-07-22 ENCOUNTER — Ambulatory Visit (INDEPENDENT_AMBULATORY_CARE_PROVIDER_SITE_OTHER): Payer: PPO | Admitting: Family Medicine

## 2015-07-22 VITALS — BP 122/70 | HR 78 | Temp 98.2°F | Resp 14 | Ht 67.0 in | Wt 211.2 lb

## 2015-07-22 DIAGNOSIS — R7989 Other specified abnormal findings of blood chemistry: Secondary | ICD-10-CM

## 2015-07-22 DIAGNOSIS — Z23 Encounter for immunization: Secondary | ICD-10-CM | POA: Diagnosis not present

## 2015-07-22 DIAGNOSIS — G4733 Obstructive sleep apnea (adult) (pediatric): Secondary | ICD-10-CM

## 2015-07-22 DIAGNOSIS — J309 Allergic rhinitis, unspecified: Secondary | ICD-10-CM

## 2015-07-22 DIAGNOSIS — J302 Other seasonal allergic rhinitis: Secondary | ICD-10-CM

## 2015-07-22 DIAGNOSIS — J3089 Other allergic rhinitis: Secondary | ICD-10-CM

## 2015-07-22 DIAGNOSIS — Z9989 Dependence on other enabling machines and devices: Secondary | ICD-10-CM

## 2015-07-22 DIAGNOSIS — R739 Hyperglycemia, unspecified: Secondary | ICD-10-CM | POA: Diagnosis not present

## 2015-07-22 DIAGNOSIS — K219 Gastro-esophageal reflux disease without esophagitis: Secondary | ICD-10-CM | POA: Diagnosis not present

## 2015-07-22 DIAGNOSIS — H6122 Impacted cerumen, left ear: Secondary | ICD-10-CM | POA: Diagnosis not present

## 2015-07-22 DIAGNOSIS — R946 Abnormal results of thyroid function studies: Secondary | ICD-10-CM | POA: Diagnosis not present

## 2015-07-22 DIAGNOSIS — E8881 Metabolic syndrome: Secondary | ICD-10-CM | POA: Diagnosis not present

## 2015-07-22 DIAGNOSIS — H938X2 Other specified disorders of left ear: Secondary | ICD-10-CM

## 2015-07-22 DIAGNOSIS — Z0000 Encounter for general adult medical examination without abnormal findings: Secondary | ICD-10-CM

## 2015-07-22 DIAGNOSIS — E669 Obesity, unspecified: Secondary | ICD-10-CM | POA: Diagnosis not present

## 2015-07-22 DIAGNOSIS — I10 Essential (primary) hypertension: Secondary | ICD-10-CM

## 2015-07-22 MED ORDER — OMEPRAZOLE 40 MG PO CPDR: 40.0000 mg | DELAYED_RELEASE_CAPSULE | Freq: Every day | ORAL | Status: DC

## 2015-07-22 MED ORDER — ZOSTER VACCINE LIVE 19400 UNT/0.65ML ~~LOC~~ SOLR: 0.6500 mL | Freq: Once | SUBCUTANEOUS | Status: DC

## 2015-07-22 MED ORDER — HYDROCHLOROTHIAZIDE 12.5 MG PO CAPS: 12.5000 mg | ORAL_CAPSULE | Freq: Every day | ORAL | Status: DC

## 2015-07-22 MED ORDER — FLUTICASONE PROPIONATE 50 MCG/ACT NA SUSP: 2.0000 | Freq: Every day | NASAL | Status: AC

## 2015-07-22 MED ORDER — LORATADINE 10 MG PO TABS: 10.0000 mg | ORAL_TABLET | Freq: Every day | ORAL | Status: DC

## 2015-07-22 NOTE — Patient Instructions (Signed)
  Dustin Cobb , Thank you for taking time to come for your Medicare Wellness Visit. I appreciate your ongoing commitment to your health goals. Please review the following plan we discussed and let me know if I can assist you in the future.   These are the goals we discussed:  Exercise for 30 minutes 5 days weekly Stop sodas and sweet tea   We will order a abdominal aorta US   This is a list of the screening recommended for you and due dates:  Health Maintenance  Topic Date Due  . Shingles Vaccine  01/13/2016*  . Flu Shot  12/28/2015  . Tetanus Vaccine  01/30/2019  . Colon Cancer Screening  12/27/2020  .  Hepatitis C: One time screening is recommended by Center for Disease Control  (CDC) for  adults born from [birth year] through 1964.   Completed  . Pneumonia vaccines  Completed  *Topic was postponed. The date shown is not the original due date.

## 2015-07-22 NOTE — Progress Notes (Signed)
Name: Dustin Cobb   MRN: [id number]    DOB: 02-24-1946   Date:07/22/2015       Progress Note  Subjective  Chief Complaint  Chief Complaint  Patient presents with  . Annual Exam    HPI  Functional ability/safety issues: No Issues Hearing issues: Addressed - wife is complaining that he has been unable to hear lately, and he has noticed left ear fullness.  Activities of daily living: Discussed Home safety issues: No Issues  End Of Life Planning: Offered verbal information regarding advanced directives, healthcare power of attorney.  Preventative care, Health maintenance, Preventative health measures discussed.  Preventative screenings discussed today: lab work, colonoscopy, PSA - we will hold off for now. No symptoms of BPH or family history of prostate cancer.  Men age 42 to 48 years if ever smoked recommended to get a one time AAA ultrasound screening exam.  Low Dose CT Chest recommended if Age 80-80 years, 30 pack-year currently smoking OR have quit w/in 15years.   Lifestyle risk factor issued reviewed: Diet, exercise, weight management, advised patient smoking is not healthy, nutrition/diet.  Preventative health measures discussed (5-10 year plan).  Reviewed and recommended vaccinations: - Pneumovax  - Prevnar  - Annual Influenza - Zostavax -he will get at local pharmacy - Tdap   Depression screening: Done Fall risk screening: Done Discuss ADLs/IADLs: Done  Current medical providers: See HPI  Other health risk factors identified this visit: No other issues Cognitive impairment issues: None identified  All above discussed with patient. Appropriate education, counseling and referral will be made based upon the above.   Hyperlipidemia: taking Pravastatin, no side effects, no chest pain or myalgias  HTN: taking medication daily, no side effects, and bp is at goal. No chest pain or SOB  Hyperglycemia: he gained some weight since last visit, but will resume  exercise, hgbA1C 6 months ago was 6.2. No polyphagia, polydipsia or polyuria  Low TSH: seen by Dr. Gabriel Carina, TSH back to normal - reviewed labs done at [facility]. She advised yearly follow up labs here in our office  GERD: under control with medication, still not following a diet, drinking sodas and tea daily.   OSA: wearing CPAP most nights for at least 6 hours. He wakes up feeling rest most days.   Obesity: he will cut down on portions and exercise more.   AR: he has noticed nasal congestion and rhinorrhea over the past week, no fever or chills.   Patient Active Problem List   Diagnosis Date Noted  . Hypertension, essential, benign 01/07/2015  . GERD (gastroesophageal reflux disease) 01/07/2015  . OSA on CPAP 01/07/2015  . Low TSH level 01/07/2015  . Hyperglycemia 01/07/2015  . Obesity (BMI 30.0-34.9) 01/07/2015  . Dyslipidemia 01/07/2015  . History of hemorrhoidectomy 01/07/2015  . History of iron deficiency anemia 01/07/2015  . Allergic rhinitis 01/07/2015  . Metabolic syndrome [id number]    Past Surgical History  Procedure Laterality Date  . Hemorrhoid banding  04/01/12    Dr. Fleet Contras    Family History  Problem Relation Age of Onset  . Cancer Mother     Thyroid  . Emphysema Father   . Hypertension Sister   . Hypertension Brother     Social History   Social History  . Marital Status: Married    Spouse Name: N/A  . Number of Children: N/A  . Years of Education: N/A   Occupational History  . Not on file.   Social History Main Topics  .  Smoking status: Former Smoker -- 1.00 packs/day for 20 years    Types: Cigarettes    Start date: 01/07/1975    Quit date: 01/07/1995  . Smokeless tobacco: Never Used  . Alcohol Use: No  . Drug Use: No  . Sexual Activity:    Partners: Female   Other Topics Concern  . Not on file   Social History Narrative     Current outpatient prescriptions:  .  aspirin 81 MG chewable tablet, Chew 1 tablet by mouth daily., Disp: , Rfl:   .  Cholecalciferol (VITAMIN D3) 1000 UNITS CAPS, Take 1 capsule by mouth daily., Disp: , Rfl:  .  ferrous sulfate 325 (65 FE) MG tablet, Take 1 tablet by mouth daily., Disp: , Rfl:  .  hydrochlorothiazide (MICROZIDE) 12.5 MG capsule, Take 1 capsule (12.5 mg total) by mouth daily., Disp: 90 capsule, Rfl: 1 .  omeprazole (PRILOSEC) 40 MG capsule, Take 1 capsule (40 mg total) by mouth daily., Disp: 90 capsule, Rfl: 1 .  pravastatin (PRAVACHOL) 40 MG tablet, TAKE 1 TABLET BY MOUTH DAILY., Disp: 90 tablet, Rfl: 1 .  ranitidine (ZANTAC) 300 MG tablet, Take 1 tablet (300 mg total) by mouth 2 (two) times daily., Disp: 180 tablet, Rfl: 1 .  zoster vaccine live, PF, (ZOSTAVAX) 16109 UNT/0.65ML injection, Inject 19,400 Units into the skin once., Disp: 1 each, Rfl: 0  No Known Allergies   ROS  Constitutional: Negative for fever , positive for  weight change.  Respiratory: Negative for cough and shortness of breath.   Cardiovascular: Negative for chest pain or palpitations.  Gastrointestinal: Negative for abdominal pain, no bowel changes.  Musculoskeletal: Negative for gait problem or joint swelling.  Skin: Negative for rash.  Neurological: Negative for dizziness or headache.  No other specific complaints in a complete review of systems (except as listed in HPI above).  Objective  Filed Vitals:   07/22/15 0825  BP: 122/70  Pulse: 78  Temp: 98.2 F (36.8 C)  TempSrc: Oral  Resp: 14  Height: 5\' 7"  (1.702 m)  Weight: 211 lb 3.2 oz (95.8 kg)  SpO2: 95%    Body mass index is 33.07 kg/(m^2).  Physical Exam  Constitutional: Patient appears well-developed and well-nourished. No distress.  HENT: Head: Normocephalic and atraumatic. Ears: B TMs normal after cerumen removal from left ear canal , no erythema or effusion; Nose: Nose normal. Mouth/Throat: Oropharynx is clear and moist. No oropharyngeal exudate.  Eyes: Conjunctivae and EOM are normal. Pupils are equal, round, and reactive to light.  No scleral icterus.  Neck: Normal range of motion. Neck supple. No JVD present. No thyromegaly present.  Cardiovascular: Normal rate, regular rhythm and normal heart sounds.  No murmur heard. No BLE edema. Pulmonary/Chest: Effort normal and breath sounds normal. No respiratory distress. Abdominal: Soft. Bowel sounds are normal, no distension. There is no tenderness. no masses MALE GENITALIA: Normal descended testes bilaterally, no masses palpated, no hernias, no lesions, no discharge RECTAL: Prostate normal size and consistency, no rectal masses or hemorrhoids Musculoskeletal: Normal range of motion, no joint effusions. No gross deformities Neurological: he is alert and oriented to person, place, and time. No cranial nerve deficit. Coordination, balance, strength, speech and gait are normal.  Skin: Skin is warm and dry. No rash noted. No erythema.  Psychiatric: Patient has a normal mood and affect. behavior is normal. Judgment and thought content normal.   PHQ2/9: Depression screen [facility] 2/9 07/22/2015 01/07/2015  Decreased Interest 0 0  Down, Depressed, Hopeless 0  0  PHQ - 2 Score 0 0     Fall Risk: Fall Risk  07/22/2015 01/07/2015  Falls in the past year? No Yes  Number falls in past yr: - 1  Injury with Fall? - No     Functional Status Survey: Is the patient deaf or have difficulty hearing?: No Does the patient have difficulty seeing, even when wearing glasses/contacts?: No Does the patient have difficulty concentrating, remembering, or making decisions?: No Does the patient have difficulty walking or climbing stairs?: No Does the patient have difficulty dressing or bathing?: No Does the patient have difficulty doing errands alone such as visiting a doctor's office or shopping?: No    Assessment & Plan  1. Medicare annual wellness visit, subsequent  Discussed importance of 150 minutes of physical activity weekly, eat two servings of fish weekly, eat one serving of tree nuts (  cashews, pistachios, pecans, almonds.Marland Kitchen) every other day, eat 6 servings of fruit/vegetables daily and drink plenty of water and avoid sweet beverages.  Used to smoke - at least 20 years, quit over 15 years ago, order US abdominal aorta  2. Hypertension, essential, benign  - hydrochlorothiazide (MICROZIDE) 12.5 MG capsule; Take 1 capsule (12.5 mg total) by mouth daily.  Dispense: 90 capsule; Refill: 1  3. OSA on CPAP  Continue CPAP every night  4. Low TSH level  We will recheck yearly  5. Hyperglycemia  Recheck next visit  6. Obesity (BMI 30.0-34.9)  Discussed with the patient the risk posed by an increased BMI. Discussed importance of portion control, calorie counting and at least 150 minutes of physical activity weekly. Avoid sweet beverages and drink more water. Eat at least 6 servings of fruit and vegetables daily   7. Gastroesophageal reflux disease without esophagitis  - omeprazole (PRILOSEC) 40 MG capsule; Take 1 capsule (40 mg total) by mouth daily.  Dispense: 90 capsule; Refill: 1  8. Metabolic syndrome  Discussed diet   9. Need for shingles vaccine  - zoster vaccine live, PF, (ZOSTAVAX) 24401 UNT/0.65ML injection; Inject 19,400 Units into the skin once.  Dispense: 1 each; Refill: 0  10. Perennial allergic rhinitis with seasonal variation  - fluticasone (FLONASE) 50 MCG/ACT nasal spray; Place 2 sprays into both nostrils daily.  Dispense: 16 g; Refill: 5 - loratadine (CLARITIN) 10 MG tablet; Take 1 tablet (10 mg total) by mouth daily.  Dispense: 30 tablet; Refill: 5  11. Excess ear wax, left  - Ear Lavage Verbal consent given Possible side effects discussed with patient Ears were  lavaged with warm water and peroxide also curette used, we had to lavage multiple times, he was advised to use otc peroxide and warm water nightly to avoid recurrence Patient tolerated procedure well No complications   12. Ear fullness, left  - Ear Lavage

## 2015-08-03 DIAGNOSIS — G4733 Obstructive sleep apnea (adult) (pediatric): Secondary | ICD-10-CM | POA: Diagnosis not present

## 2015-09-03 DIAGNOSIS — G4733 Obstructive sleep apnea (adult) (pediatric): Secondary | ICD-10-CM | POA: Diagnosis not present

## 2015-11-22 ENCOUNTER — Other Ambulatory Visit: Payer: Self-pay | Admitting: Family Medicine

## 2015-11-22 NOTE — Telephone Encounter (Signed)
Patient requesting refill. 

## 2016-01-12 ENCOUNTER — Other Ambulatory Visit: Payer: Self-pay | Admitting: Family Medicine

## 2016-01-12 NOTE — Telephone Encounter (Signed)
Patient requesting refill of Pravastatin be sent to [place].

## 2016-01-19 ENCOUNTER — Encounter: Payer: Self-pay | Admitting: Family Medicine

## 2016-01-19 ENCOUNTER — Ambulatory Visit (INDEPENDENT_AMBULATORY_CARE_PROVIDER_SITE_OTHER): Payer: PPO | Admitting: Family Medicine

## 2016-01-19 VITALS — BP 118/64 | HR 67 | Temp 97.5°F | Resp 16 | Ht 67.0 in | Wt 204.8 lb

## 2016-01-19 DIAGNOSIS — R739 Hyperglycemia, unspecified: Secondary | ICD-10-CM | POA: Diagnosis not present

## 2016-01-19 DIAGNOSIS — Z862 Personal history of diseases of the blood and blood-forming organs and certain disorders involving the immune mechanism: Secondary | ICD-10-CM

## 2016-01-19 DIAGNOSIS — J3089 Other allergic rhinitis: Secondary | ICD-10-CM

## 2016-01-19 DIAGNOSIS — E785 Hyperlipidemia, unspecified: Secondary | ICD-10-CM

## 2016-01-19 DIAGNOSIS — R946 Abnormal results of thyroid function studies: Secondary | ICD-10-CM | POA: Diagnosis not present

## 2016-01-19 DIAGNOSIS — E669 Obesity, unspecified: Secondary | ICD-10-CM | POA: Diagnosis not present

## 2016-01-19 DIAGNOSIS — K219 Gastro-esophageal reflux disease without esophagitis: Secondary | ICD-10-CM

## 2016-01-19 DIAGNOSIS — E8881 Metabolic syndrome: Secondary | ICD-10-CM | POA: Diagnosis not present

## 2016-01-19 DIAGNOSIS — J302 Other seasonal allergic rhinitis: Secondary | ICD-10-CM

## 2016-01-19 DIAGNOSIS — Z9989 Dependence on other enabling machines and devices: Secondary | ICD-10-CM

## 2016-01-19 DIAGNOSIS — G4733 Obstructive sleep apnea (adult) (pediatric): Secondary | ICD-10-CM

## 2016-01-19 DIAGNOSIS — I10 Essential (primary) hypertension: Secondary | ICD-10-CM | POA: Diagnosis not present

## 2016-01-19 DIAGNOSIS — J309 Allergic rhinitis, unspecified: Secondary | ICD-10-CM | POA: Diagnosis not present

## 2016-01-19 DIAGNOSIS — R7989 Other specified abnormal findings of blood chemistry: Secondary | ICD-10-CM

## 2016-01-19 LAB — CBC WITH DIFFERENTIAL/PLATELET
BASOS ABS: 0 {cells}/uL (ref 0–200)
Basophils Relative: 0 %
EOS PCT: 2 %
Eosinophils Absolute: 110 cells/uL (ref 15–500)
HEMATOCRIT: 43.9 % (ref 38.5–50.0)
HEMOGLOBIN: 14.6 g/dL (ref 13.2–17.1)
LYMPHS ABS: 1980 {cells}/uL (ref 850–3900)
Lymphocytes Relative: 36 %
MCH: 28.1 pg (ref 27.0–33.0)
MCHC: 33.3 g/dL (ref 32.0–36.0)
MCV: 84.6 fL (ref 80.0–100.0)
MPV: 9.2 fL (ref 7.5–12.5)
Monocytes Absolute: 440 cells/uL (ref 200–950)
Monocytes Relative: 8 %
NEUTROS PCT: 54 %
Neutro Abs: 2970 cells/uL (ref 1500–7800)
Platelets: 242 10*3/uL (ref 140–400)
RBC: 5.19 MIL/uL (ref 4.20–5.80)
RDW: 15.4 % — ABNORMAL HIGH (ref 11.0–15.0)
WBC: 5.5 10*3/uL (ref 3.8–10.8)

## 2016-01-19 LAB — TSH: TSH: 0.15 mIU/L — ABNORMAL LOW (ref 0.40–4.50)

## 2016-01-19 MED ORDER — OMEPRAZOLE 40 MG PO CPDR: 40.0000 mg | DELAYED_RELEASE_CAPSULE | Freq: Every day | ORAL | 1 refills | Status: DC

## 2016-01-19 NOTE — Patient Instructions (Signed)
Try to stop HCTZ ( bp medication) and if bp stays below 139/85 you may stay off medication, otherwise call back for a refill

## 2016-01-19 NOTE — Progress Notes (Signed)
Name: Dustin Cobb   MRN: [id number]    DOB: [birth year]   Date:01/19/2016       Progress Note  Subjective  Chief Complaint  Chief Complaint  Patient presents with  . Medication Refill    6 month F/U  . Hypertension  . Gastroesophageal Reflux    Well controlled with daily use of medication  . Hyperlipidemia  . Allergic Rhinitis     Takes Claritin daily and helps with symptom control  . Sleep Apnea    HPI  Hyperlipidemia: taking Pravastatin, no side effects, no chest pain or myalgias. He has some calf tightness at night, but improves when he stretches.   HTN: taking medication daily, no side effects, bp is towards low end of normal, occasionally he feels dizzy when he stands up. No chest pain or SOB  Hyperglycemia: he has lost almost 7 lbs since last visit, he has been more active since retirement. Also watching a 26 yo child for the past two months and is moving all the time.  No polyphagia, polydipsia or polyuria  Low TSH: seen by Dr. Gabriel Carina, TSH back to normal - reviewed labs done at [facility]. She advised yearly follow up labs here in our office  GERD: under control with medication, still not following a diet, drinking sodas and tea daily, but states not as much. At most one soda or tea per day, drinking more water. Discussed risk of long term use of PPI such as Alzheimer's dementia, colitis, osteoporosis. Discussed trying to wean it off.     OSA: wearing CPAP most nights for at least 6 hours. He wakes up feeling rest most days.   Obesity: he has lost weight since last visit and is doing well.   AR: he has noticed nasal congestion and rhinorrhea over the past week, no fever or chills.   Patient Active Problem List   Diagnosis Date Noted  . Hypertension, essential, benign 01/07/2015  . GERD (gastroesophageal reflux disease) 01/07/2015  . OSA on CPAP 01/07/2015  . Low TSH level 01/07/2015  . Hyperglycemia 01/07/2015  . Obesity (BMI 30.0-34.9) 01/07/2015  .  Dyslipidemia 01/07/2015  . History of hemorrhoidectomy 01/07/2015  . History of iron deficiency anemia 01/07/2015  . Allergic rhinitis 01/07/2015  . Metabolic syndrome [id number]    Past Surgical History:  Procedure Laterality Date  . HEMORRHOID BANDING  04/01/12   Dr. Fleet Contras    Family History  Problem Relation Age of Onset  . Cancer Mother     Thyroid  . Emphysema Father   . Hypertension Sister   . Hypertension Brother     Social History   Social History  . Marital status: Married    Spouse name: N/A  . Number of children: N/A  . Years of education: N/A   Occupational History  . Not on file.   Social History Main Topics  . Smoking status: Former Smoker    Packs/day: 1.00    Years: 20.00    Types: Cigarettes    Start date: 01/07/1975    Quit date: 01/07/1995  . Smokeless tobacco: Never Used  . Alcohol use No  . Drug use: No  . Sexual activity: Yes    Partners: Female   Other Topics Concern  . Not on file   Social History Narrative  . No narrative on file     Current Outpatient Prescriptions:  .  aspirin 81 MG chewable tablet, Chew 1 tablet by mouth daily., Disp: , Rfl:  .  Cholecalciferol (VITAMIN D3) 1000 UNITS CAPS, Take 1 capsule by mouth daily., Disp: , Rfl:  .  ferrous sulfate 325 (65 FE) MG tablet, Take 1 tablet by mouth daily., Disp: , Rfl:  .  fluticasone (FLONASE) 50 MCG/ACT nasal spray, Place 2 sprays into both nostrils daily., Disp: 16 g, Rfl: 5 .  hydrochlorothiazide (MICROZIDE) 12.5 MG capsule, Take 1 capsule (12.5 mg total) by mouth daily., Disp: 90 capsule, Rfl: 1 .  loratadine (CLARITIN) 10 MG tablet, Take 1 tablet (10 mg total) by mouth daily., Disp: 30 tablet, Rfl: 5 .  omeprazole (PRILOSEC) 40 MG capsule, Take 1 capsule (40 mg total) by mouth daily., Disp: 90 capsule, Rfl: 1 .  pravastatin (PRAVACHOL) 40 MG tablet, TAKE 1 TABLET BY MOUTH DAILY, Disp: 90 tablet, Rfl: 1 .  ranitidine (ZANTAC) 300 MG tablet, TAKE 1 TABLET BY MOUTH 2 TIMES  DAILY., Disp: 180 tablet, Rfl: 1  No Known Allergies   ROS  Constitutional: Negative for fever, positive for  weight change.  Respiratory: Negative for cough and shortness of breath.   Cardiovascular: Negative for chest pain or palpitations.  Gastrointestinal: Negative for abdominal pain, no bowel changes.  Musculoskeletal: Negative for gait problem or joint swelling.  Skin: Negative for rash.  Neurological: positive for intermittent  headache, but has occasional dizziness  No other specific complaints in a complete review of systems (except as listed in HPI above).  Objective  Vitals:   01/19/16 0900  BP: 118/64  Pulse: 67  Resp: 16  Temp: 97.5 F (36.4 C)  TempSrc: Oral  SpO2: 92%  Weight: 204 lb 12.8 oz (92.9 kg)  Height: 5\' 7"  (1.702 m)    Body mass index is 32.08 kg/m.  Physical Exam  Constitutional: Patient appears well-developed and well-nourished. Obese  No distress.  HEENT: head atraumatic, normocephalic, pupils equal and reactive to light,  neck supple, throat within normal limits Cardiovascular: Normal rate, regular rhythm and normal heart sounds.  No murmur heard. No BLE edema. Pulmonary/Chest: Effort normal and breath sounds normal. No respiratory distress. Abdominal: Soft.  There is no tenderness. Psychiatric: Patient has a normal mood and affect. behavior is normal. Judgment and thought content normal.  PHQ2/9: Depression screen [facility] 2/9 01/19/2016 07/22/2015 01/07/2015  Decreased Interest 0 0 0  Down, Depressed, Hopeless 0 0 0  PHQ - 2 Score 0 0 0    Fall Risk: Fall Risk  01/19/2016 07/22/2015 01/07/2015  Falls in the past year? No No Yes  Number falls in past yr: - - 1  Injury with Fall? - - No     Functional Status Survey: Is the patient deaf or have difficulty hearing?: No Does the patient have difficulty seeing, even when wearing glasses/contacts?: No Does the patient have difficulty concentrating, remembering, or making decisions?: No Does  the patient have difficulty walking or climbing stairs?: No Does the patient have difficulty dressing or bathing?: No Does the patient have difficulty doing errands alone such as visiting a doctor's office or shopping?: No    Assessment & Plan  1. Hypertension, essential, benign  He will try to wean off HCTZ - COMPLETE METABOLIC PANEL WITH GFR  2. OSA on CPAP  Continue CPAP every night  3. Metabolic syndrome  Recheck level, losing weight   4. Hyperglycemia  - Hemoglobin A1c  5. Gastroesophageal reflux disease without esophagitis  He will try weaning off Prilosec - omeprazole (PRILOSEC) 40 MG capsule; Take 1 capsule (40 mg total) by mouth daily.  Dispense:  90 capsule; Refill: 1  6. Obesity (BMI 30.0-34.9)  Discussed all optiosns for weight loss medications including Belviq, Qsymia, Saxenda and Contrave. Discussed risk and benefits of each of them.  7. Dyslipidemia  - Lipid panel  8. History of iron deficiency anemia  - CBC with Differential/Platelet  9. Low TSH level  - TSH

## 2016-01-20 LAB — LIPID PANEL
Cholesterol: 162 mg/dL (ref 125–200)
HDL: 68 mg/dL
LDL Cholesterol: 83 mg/dL
Total CHOL/HDL Ratio: 2.4 ratio
Triglycerides: 57 mg/dL
VLDL: 11 mg/dL

## 2016-01-20 LAB — COMPLETE METABOLIC PANEL WITHOUT GFR
ALT: 20 U/L (ref 9–46)
AST: 22 U/L (ref 10–35)
Albumin: 3.8 g/dL (ref 3.6–5.1)
Alkaline Phosphatase: 88 U/L (ref 40–115)
BUN: 19 mg/dL (ref 7–25)
CO2: 25 mmol/L (ref 20–31)
Calcium: 9.7 mg/dL (ref 8.6–10.3)
Chloride: 104 mmol/L (ref 98–110)
Creat: 1.04 mg/dL (ref 0.70–1.25)
GFR, Est African American: 84 mL/min
GFR, Est Non African American: 73 mL/min
Glucose, Bld: 93 mg/dL (ref 65–99)
Potassium: 4.3 mmol/L (ref 3.5–5.3)
Sodium: 137 mmol/L (ref 135–146)
Total Bilirubin: 0.6 mg/dL (ref 0.2–1.2)
Total Protein: 6.7 g/dL (ref 6.1–8.1)

## 2016-01-20 LAB — HEMOGLOBIN A1C
Hgb A1c MFr Bld: 5.8 % — ABNORMAL HIGH
Mean Plasma Glucose: 120 mg/dL

## 2016-02-01 DIAGNOSIS — G4733 Obstructive sleep apnea (adult) (pediatric): Secondary | ICD-10-CM | POA: Diagnosis not present

## 2016-02-01 DIAGNOSIS — E669 Obesity, unspecified: Secondary | ICD-10-CM | POA: Diagnosis not present

## 2016-02-22 ENCOUNTER — Ambulatory Visit: Payer: PPO

## 2016-02-22 VITALS — BP 118/82 | HR 70

## 2016-02-22 DIAGNOSIS — I10 Essential (primary) hypertension: Secondary | ICD-10-CM

## 2016-04-11 ENCOUNTER — Ambulatory Visit (INDEPENDENT_AMBULATORY_CARE_PROVIDER_SITE_OTHER): Payer: PPO

## 2016-04-11 DIAGNOSIS — Z23 Encounter for immunization: Secondary | ICD-10-CM | POA: Diagnosis not present

## 2016-05-17 ENCOUNTER — Other Ambulatory Visit: Payer: Self-pay | Admitting: Family Medicine

## 2016-05-17 NOTE — Telephone Encounter (Signed)
Patient requesting refill of Ranitidine to [facility].

## 2016-07-13 ENCOUNTER — Other Ambulatory Visit: Payer: Self-pay | Admitting: Family Medicine

## 2016-07-13 NOTE — Telephone Encounter (Signed)
Patient requesting refill of Pravastatin to [facility].  

## 2016-07-24 ENCOUNTER — Encounter: Payer: PPO | Admitting: Family Medicine

## 2016-08-01 DIAGNOSIS — G4733 Obstructive sleep apnea (adult) (pediatric): Secondary | ICD-10-CM | POA: Diagnosis not present

## 2016-08-10 ENCOUNTER — Encounter: Payer: Self-pay | Admitting: Family Medicine

## 2016-08-10 ENCOUNTER — Ambulatory Visit (INDEPENDENT_AMBULATORY_CARE_PROVIDER_SITE_OTHER): Payer: PPO | Admitting: Family Medicine

## 2016-08-10 VITALS — BP 126/86 | HR 70 | Temp 97.6°F | Resp 16 | Ht 66.0 in | Wt 209.1 lb

## 2016-08-10 DIAGNOSIS — E8881 Metabolic syndrome: Secondary | ICD-10-CM

## 2016-08-10 DIAGNOSIS — Z9989 Dependence on other enabling machines and devices: Secondary | ICD-10-CM | POA: Diagnosis not present

## 2016-08-10 DIAGNOSIS — R21 Rash and other nonspecific skin eruption: Secondary | ICD-10-CM

## 2016-08-10 DIAGNOSIS — R7989 Other specified abnormal findings of blood chemistry: Secondary | ICD-10-CM

## 2016-08-10 DIAGNOSIS — I10 Essential (primary) hypertension: Secondary | ICD-10-CM

## 2016-08-10 DIAGNOSIS — R946 Abnormal results of thyroid function studies: Secondary | ICD-10-CM | POA: Diagnosis not present

## 2016-08-10 DIAGNOSIS — Z0000 Encounter for general adult medical examination without abnormal findings: Secondary | ICD-10-CM | POA: Diagnosis not present

## 2016-08-10 DIAGNOSIS — E785 Hyperlipidemia, unspecified: Secondary | ICD-10-CM

## 2016-08-10 DIAGNOSIS — G4733 Obstructive sleep apnea (adult) (pediatric): Secondary | ICD-10-CM | POA: Diagnosis not present

## 2016-08-10 DIAGNOSIS — K219 Gastro-esophageal reflux disease without esophagitis: Secondary | ICD-10-CM | POA: Diagnosis not present

## 2016-08-10 MED ORDER — OMEPRAZOLE 40 MG PO CPDR: 40.0000 mg | DELAYED_RELEASE_CAPSULE | Freq: Every day | ORAL | 1 refills | Status: DC

## 2016-08-10 MED ORDER — TRIAMCINOLONE ACETONIDE 0.1 % EX CREA: 1.0000 "application " | TOPICAL_CREAM | Freq: Two times a day (BID) | CUTANEOUS | 0 refills | Status: DC

## 2016-08-10 NOTE — Progress Notes (Signed)
Name: Dustin Cobb   MRN: [id number]    DOB: 07-19-1945   Date:08/10/2016       Progress Note  Subjective  Chief Complaint  Chief Complaint  Patient presents with  . Annual Exam    HPI  Functional ability/safety issues: No Issues Hearing issues: Addressed  Activities of daily living: Discussed Home safety issues: No Issues  End Of Life Planning: Offered verbal information regarding advanced directives, healthcare power of attorney.  Preventative care, Health maintenance, Preventative health measures discussed.  Preventative screenings discussed today: lab work, colonoscopy, PSA - we will hold off for now. No symptoms of BPH or family history of prostate cancer.  Men age 36 to 63 years if ever smoked recommended to get a one time AAA ultrasound screening exam.  Low Dose CT Chest recommended if Age 44-80 years, 30 pack-year currently smoking OR have quit w/in 15years.   Lifestyle risk factor issued reviewed: Diet, exercise, weight management, advised patient smoking is not healthy, nutrition/diet.  Preventative health measures discussed (5-10 year plan).  Reviewed and recommended vaccinations: - Pneumovax  - Prevnar  - Annual Influenza - Zostavax -he will get at local pharmacy - Tdap   Depression screening: Done Fall risk screening: Done Discuss ADLs/IADLs: Done  Current medical providers: See HPI  Other health risk factors identified this visit: No other issues Cognitive impairment issues: None identified  All above discussed with patient. Appropriate education, counseling and referral will be made based upon the above.   Hyperlipidemia: taking Pravastatin, no side effects, no chest pain or myalgias, reviewed last labs with patient  HTN: taking medication daily, no side effects, and bp is at goal. No chest pain or SOB  Hyperglycemia: he gained some weight since last visit, hgbA1C 6 months ago had improved down to 5.8 %No polyphagia, polydipsia  or polyuria   Low TSH: seen by Dr. Gabriel Carina, TSH back to normal - reviewed labs done at [facility]. She advised yearly follow up labs here in our office  GERD: under control with medication, still not following a diet, drinking sodas and tea daily.   OSA: wearing CPAP most nights for at least 6 hours. He wakes up feeling rest most days.   Obesity: he will cut down on portions and exercise more. His weight has gone up since last visit.  IPSS Questionnaire (AUA-7): Over the past month.   1)  How often have you had a sensation of not emptying your bladder completely after you finish urinating?  1 - Less than 1 time in 5  2)  How often have you had to urinate again less than two hours after you finished urinating? 1 - Less than 1 time in 5  3)  How often have you found you stopped and started again several times when you urinated?  0 - Not at all  4) How difficult have you found it to postpone urination?  1 - Less than 1 time in 5  5) How often have you had a weak urinary stream?  1 - Less than 1 time in 5  6) How often have you had to push or strain to begin urination?  0 - Not at all  7) How many times did you most typically get up to urinate from the time you went to bed until the time you got up in the morning?  0 - None  Total score:  0-7 mildly symptomatic   8-19 moderately symptomatic   20-35 severely symptomatic  Patient Active Problem List   Diagnosis Date Noted  . Hypertension, essential, benign 01/07/2015  . GERD (gastroesophageal reflux disease) 01/07/2015  . OSA on CPAP 01/07/2015  . Low TSH level 01/07/2015  . Hyperglycemia 01/07/2015  . Obesity (BMI 30.0-34.9) 01/07/2015  . Dyslipidemia 01/07/2015  . History of hemorrhoidectomy 01/07/2015  . History of iron deficiency anemia 01/07/2015  . Allergic rhinitis 01/07/2015  . Metabolic syndrome 51/06/5850    Past Surgical History:  Procedure Laterality Date  . HEMORRHOID BANDING  04/01/12   Dr. Fleet Contras    Family  History  Problem Relation Age of Onset  . Cancer Mother     Thyroid  . Emphysema Father   . Hypertension Sister   . Hypertension Brother     Social History   Social History  . Marital status: Married    Spouse name: N/A  . Number of children: N/A  . Years of education: N/A   Occupational History  . Not on file.   Social History Main Topics  . Smoking status: Former Smoker    Packs/day: 1.00    Years: 20.00    Types: Cigarettes    Start date: 01/07/1975    Quit date: 01/07/1995  . Smokeless tobacco: Never Used  . Alcohol use No  . Drug use: No  . Sexual activity: Yes    Partners: Female   Other Topics Concern  . 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    Married and they are helping raise a 20 yo child - the mother is a family friend.     Current Outpatient Prescriptions:  .  aspirin 81 MG chewable tablet, Chew 1 tablet by mouth daily., Disp: , Rfl:  .  Cholecalciferol (VITAMIN D3) 1000 UNITS CAPS, Take 1 capsule by mouth daily., Disp: , Rfl:  .  ferrous sulfate 325 (65 FE) MG tablet, Take 1 tablet by mouth daily., Disp: , Rfl:  .  fluticasone (FLONASE) 50 MCG/ACT nasal spray, Place 2 sprays into both nostrils daily., Disp: 16 g, Rfl: 5 .  loratadine (CLARITIN) 10 MG tablet, Take 1 tablet (10 mg total) by mouth daily., Disp: 30 tablet, Rfl: 5 .  omeprazole (PRILOSEC) 40 MG capsule, Take 1 capsule (40 mg total) by mouth daily., Disp: 90 capsule, Rfl: 1 .  pravastatin (PRAVACHOL) 40 MG tablet, TAKE 1 TABLET BY MOUTH DAILY, Disp: 90 tablet, Rfl: 1 .  ranitidine (ZANTAC) 300 MG tablet, TAKE 1 TABLET BY MOUTH 2 TIMES DAILY., Disp: 180 tablet, Rfl: 1  No Known Allergies   ROS  Constitutional: Negative for fever, positive for  weight change.  Respiratory: Negative for cough and shortness of breath.   Cardiovascular: Negative for chest pain or palpitations.  Gastrointestinal: Negative for abdominal pain, no bowel changes.   Musculoskeletal: Negative for gait problem or joint swelling.  Skin: Negative for rash.  Neurological: Negative for dizziness , positive for intermittent  Headache - very seldom now.  No other specific complaints in a complete review of systems (except as listed in HPI above).   Objective  Vitals:   08/10/16 1019  BP: 126/86  Pulse: 70  Resp: 16  Temp: 97.6 F (36.4 C)  TempSrc: Oral  SpO2: 97%  Weight: 209 lb 1.6 oz (94.8 kg)  Height: 5\' 6"  (1.676 m)    Body mass index is 33.75 kg/m.  Physical Exam  Constitutional: Patient appears well-developed and well-nourished. No distress.  HENT: Head: Normocephalic and  atraumatic. Ears: B TMs ok, no erythema or effusion; Nose: Nose normal. Mouth/Throat: Oropharynx is clear and moist. No oropharyngeal exudate.  Eyes: Conjunctivae and EOM are normal. Pupils are equal, round, and reactive to light. No scleral icterus.  Neck: Normal range of motion. Neck supple. No JVD present. No thyromegaly present.  Cardiovascular: Normal rate, regular rhythm and normal heart sounds.  No murmur heard. No BLE edema. Pulmonary/Chest: Effort normal and breath sounds normal. No respiratory distress. Abdominal: Soft. Bowel sounds are normal, no distension. There is no tenderness. no masses MALE GENITALIA: Normal descended testes bilaterally, no masses palpated, no hernias, no lesions, no discharge RECTAL: no external hemorrhoid, prostate slightly enlarged but no nodules Musculoskeletal: Normal range of motion, no joint effusions. No gross deformities Neurological: he is alert and oriented to person, place, and time. No cranial nerve deficit. Coordination, balance, strength, speech and gait are normal.  Skin: Skin is warm and dry. Rash that is plaques , with some cracking and white discoloration.  Psychiatric: Patient has a normal mood and affect. behavior is normal. Judgment and thought content normal.  PHQ2/9: Depression screen [facility] 2/9 08/10/2016 01/19/2016  07/22/2015 01/07/2015  Decreased Interest 0 0 0 0  Down, Depressed, Hopeless 0 0 0 0  PHQ - 2 Score 0 0 0 0    Fall Risk: Fall Risk  08/10/2016 01/19/2016 07/22/2015 01/07/2015  Falls in the past year? No No No Yes  Number falls in past yr: - - - 1  Injury with Fall? - - - No     Functional Status Survey: Is the patient deaf or have difficulty hearing?: No Does the patient have difficulty seeing, even when wearing glasses/contacts?: No Does the patient have difficulty concentrating, remembering, or making decisions?: No Does the patient have difficulty walking or climbing stairs?: No Does the patient have difficulty dressing or bathing?: No Does the patient have difficulty doing errands alone such as visiting a doctor's office or shopping?: No    Assessment & Plan  1. Medicare annual wellness visit, subsequent  Discussed importance of 150 minutes of physical activity weekly, eat two servings of fish weekly, eat one serving of tree nuts ( cashews, pistachios, pecans, almonds.Marland Kitchen) every other day, eat 6 servings of fruit/vegetables daily and drink plenty of water and avoid sweet beverages.   2. Hypertension, essential, benign  At goal   3. OSA on CPAP  Continue CPAP machine  4. Metabolic syndrome  Discussed life style modification   5. Gastroesophageal reflux disease without esophagitis  - omeprazole (PRILOSEC) 40 MG capsule; Take 1 capsule (40 mg total) by mouth daily.  Dispense: 90 capsule; Refill: 1  6. Dyslipidemia  Doing well, continue medication.  7. Low TSH level  - Thyroid Panel With TSH  8. Rash of hands  Possible psoriasis, we will try medication but if no improvement call back for referral to Dermatologist - triamcinolone cream (KENALOG) 0.1 %; Apply 1 application topically 2 (two) times daily.  Dispense: 80 g; Refill: 0

## 2016-08-11 LAB — THYROID PANEL WITH TSH
FREE THYROXINE INDEX: 2.7 (ref 1.4–3.8)
T3 Uptake: 33 % (ref 22–35)
T4 TOTAL: 8.1 ug/dL (ref 4.5–12.0)
TSH: 0.31 mIU/L — ABNORMAL LOW (ref 0.40–4.50)

## 2016-11-10 ENCOUNTER — Other Ambulatory Visit: Payer: Self-pay | Admitting: Family Medicine

## 2016-11-10 NOTE — Telephone Encounter (Signed)
Patient requesting refill of Zantac to [facility].

## 2017-01-12 ENCOUNTER — Other Ambulatory Visit: Payer: Self-pay | Admitting: Family Medicine

## 2017-01-12 NOTE — Telephone Encounter (Signed)
Patient requesting refill of Pravastatin to [facility].

## 2017-02-08 ENCOUNTER — Encounter: Payer: Self-pay | Admitting: Family Medicine

## 2017-02-08 ENCOUNTER — Ambulatory Visit (INDEPENDENT_AMBULATORY_CARE_PROVIDER_SITE_OTHER): Payer: PPO | Admitting: Family Medicine

## 2017-02-08 VITALS — BP 128/88 | HR 66 | Temp 98.2°F | Resp 16 | Ht 66.0 in | Wt 207.0 lb

## 2017-02-08 DIAGNOSIS — G4733 Obstructive sleep apnea (adult) (pediatric): Secondary | ICD-10-CM | POA: Diagnosis not present

## 2017-02-08 DIAGNOSIS — E669 Obesity, unspecified: Secondary | ICD-10-CM | POA: Diagnosis not present

## 2017-02-08 DIAGNOSIS — R7989 Other specified abnormal findings of blood chemistry: Secondary | ICD-10-CM

## 2017-02-08 DIAGNOSIS — E785 Hyperlipidemia, unspecified: Secondary | ICD-10-CM | POA: Diagnosis not present

## 2017-02-08 DIAGNOSIS — I10 Essential (primary) hypertension: Secondary | ICD-10-CM | POA: Diagnosis not present

## 2017-02-08 DIAGNOSIS — E8881 Metabolic syndrome: Secondary | ICD-10-CM

## 2017-02-08 DIAGNOSIS — Z9989 Dependence on other enabling machines and devices: Secondary | ICD-10-CM | POA: Diagnosis not present

## 2017-02-08 DIAGNOSIS — K219 Gastro-esophageal reflux disease without esophagitis: Secondary | ICD-10-CM | POA: Diagnosis not present

## 2017-02-08 DIAGNOSIS — R739 Hyperglycemia, unspecified: Secondary | ICD-10-CM | POA: Diagnosis not present

## 2017-02-08 DIAGNOSIS — R946 Abnormal results of thyroid function studies: Secondary | ICD-10-CM

## 2017-02-08 DIAGNOSIS — E66811 Obesity, class 1: Secondary | ICD-10-CM

## 2017-02-08 MED ORDER — OMEPRAZOLE 40 MG PO CPDR: 40.0000 mg | DELAYED_RELEASE_CAPSULE | Freq: Every day | ORAL | 1 refills | Status: DC

## 2017-02-08 MED ORDER — RANITIDINE HCL 300 MG PO TABS: 300.0000 mg | ORAL_TABLET | Freq: Two times a day (BID) | ORAL | 1 refills | Status: DC

## 2017-02-08 NOTE — Progress Notes (Signed)
Name: Dustin Cobb   MRN: [id number]    DOB: 1945-12-03   Date:02/08/2017       Progress Note  Subjective  Chief Complaint  Chief Complaint  Patient presents with  . 6 month follow up    HPI  Hyperlipidemia: taking Pravastatin, no side effects, no chest pain or myalgias, we will recheck labs today  HTN: he stopped taking medication n 2017 because bp has been at goal, since retirement.  No chest pain or SOB  Hyperglycemia: he lost some weight since last visit, hgbA1C 6 months ago had improved down to 5.8 %No polyphagia, polydipsia or polyuria . He has not changed his diet, but is eating smaller portions, also more active carrying for a toddler.   Low TSH: seen by Dr. Gabriel Carina, TSH was back to normal - reviewed labs done at [facility]. She advised yearly follow up labs here in our office, we will recheck today  GERD: under control with medication, still not following a diet, he continues to drink sodas and tea daily, but only buying the 8 ounces cans. Explained risk of PPI and H2 blocker use.   OSA: wearing CPAP most nights for at least 6 hours. He wakes up feeling rested most days.   Obesity: he will cut down on portions and exercise more. His weight has gone down  since last visit.  Patient Active Problem List   Diagnosis Date Noted  . Rash of hands 08/10/2016  . Hypertension, essential, benign 01/07/2015  . GERD (gastroesophageal reflux disease) 01/07/2015  . OSA on CPAP 01/07/2015  . Low TSH level 01/07/2015  . Hyperglycemia 01/07/2015  . Obesity (BMI 30.0-34.9) 01/07/2015  . Dyslipidemia 01/07/2015  . History of hemorrhoidectomy 01/07/2015  . History of iron deficiency anemia 01/07/2015  . Allergic rhinitis 01/07/2015  . Metabolic syndrome 60/73/7106    Past Surgical History:  Procedure Laterality Date  . HEMORRHOID BANDING  04/01/12   Dr. Fleet Contras    Family History  Problem Relation Age of Onset  . Cancer Mother        Thyroid  . Emphysema Father   .  Hypertension Sister   . Hypertension Brother     Social History   Social History  . Marital status: Married    Spouse name: N/A  . Number of children: N/A  . Years of education: N/A   Occupational History  . retired    Social History Main Topics  . Smoking status: Former Smoker    Packs/day: 1.00    Years: 20.00    Types: Cigarettes    Start date: 01/07/1975    Quit date: 01/07/1995  . Smokeless tobacco: Never Used  . Alcohol use No  . Drug use: No  . Sexual activity: Yes    Partners: Female   Other Topics Concern  . 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    Married and they are helping raise a 54 yo child ( girl)  - the mother is a family friend.     Current Outpatient Prescriptions:  .  aspirin 81 MG chewable tablet, Chew 1 tablet by mouth daily., Disp: , Rfl:  .  Cholecalciferol (VITAMIN D3) 1000 UNITS CAPS, Take 1 capsule by mouth daily., Disp: , Rfl:  .  ferrous sulfate 325 (65 FE) MG tablet, Take 1 tablet by mouth daily., Disp: , Rfl:  .  loratadine (CLARITIN) 10 MG tablet, Take 1 tablet (10 mg total) by mouth  daily., Disp: 30 tablet, Rfl: 5 .  omeprazole (PRILOSEC) 40 MG capsule, Take 1 capsule (40 mg total) by mouth daily., Disp: 90 capsule, Rfl: 1 .  pravastatin (PRAVACHOL) 40 MG tablet, TAKE 1 TABLET BY MOUTH DAILY, Disp: 90 tablet, Rfl: 1 .  ranitidine (ZANTAC) 300 MG tablet, Take 1 tablet (300 mg total) by mouth 2 (two) times daily. Try to take it only at night, Disp: 180 tablet, Rfl: 1 .  triamcinolone cream (KENALOG) 0.1 %, Apply 1 application topically 2 (two) times daily., Disp: 80 g, Rfl: 0 .  fluticasone (FLONASE) 50 MCG/ACT nasal spray, Place 2 sprays into both nostrils daily. (Patient not taking: Reported on 02/08/2017), Disp: 16 g, Rfl: 5  No Known Allergies   ROS  Constitutional: Negative for fever or weight change.  Respiratory: Negative for cough and shortness of breath.   Cardiovascular: Negative for  chest pain or palpitations.  Gastrointestinal: Negative for abdominal pain, no bowel changes.  Musculoskeletal: Negative for gait problem or joint swelling.  Skin: Poisitve for rash, left hand.  Neurological: Negative for dizziness or headache.  No other specific complaints in a complete review of systems (except as listed in HPI above).  Objective  Vitals:   02/08/17 0803 02/08/17 0805  BP: 128/88 128/88  Pulse: 66 66  Resp: 16 16  Temp: 98.2 F (36.8 C) 98.2 F (36.8 C)  TempSrc: Oral Oral  SpO2: 92% 92%  Weight: 207 lb (93.9 kg) 207 lb (93.9 kg)  Height: 5\' 6"  (1.676 m) 5\' 6"  (1.676 m)    Body mass index is 33.41 kg/m.  Physical Exam  Constitutional: Patient appears well-developed and well-nourished. Obese  No distress.  HEENT: head atraumatic, normocephalic, pupils equal and reactive to light, neck supple, throat within normal limits Cardiovascular: Normal rate, regular rhythm and normal heart sounds.  No murmur heard. No BLE edema. Pulmonary/Chest: Effort normal and breath sounds normal. No respiratory distress. Abdominal: Soft.  There is no tenderness. Psychiatric: Patient has a normal mood and affect. behavior is normal. Judgment and thought content normal.  PHQ2/9: Depression screen [facility] 2/9 02/08/2017 08/10/2016 01/19/2016 07/22/2015 01/07/2015  Decreased Interest 0 0 0 0 0  Down, Depressed, Hopeless 0 0 0 0 0  PHQ - 2 Score 0 0 0 0 0    Fall Risk: Fall Risk  02/08/2017 08/10/2016 01/19/2016 07/22/2015 01/07/2015  Falls in the past year? No No No No Yes  Number falls in past yr: - - - - 1  Injury with Fall? - - - - No    Functional Status Survey: Is the patient deaf or have difficulty hearing?: No Does the patient have difficulty seeing, even when wearing glasses/contacts?: Yes Does the patient have difficulty concentrating, remembering, or making decisions?: No Does the patient have difficulty walking or climbing stairs?: No Does the patient have difficulty  dressing or bathing?: No Does the patient have difficulty doing errands alone such as visiting a doctor's office or shopping?: No  Assessment & Plan  1. Hypertension, essential, benign  - COMPLETE METABOLIC PANEL WITH GFR - CBC with Differential/Platelet  2. OSA on CPAP  He wears most night, skips when nasal congestion is high  3. Metabolic syndrome  - Hemoglobin A1c - Insulin, fasting  4. Gastroesophageal reflux disease without esophagitis  Discussed risk of high dose Ranitidine and also PPI long term use, he will try decreasing ranitidine to once per day, and if symptoms severe go back to GI - omeprazole (PRILOSEC) 40 MG capsule;  Take 1 capsule (40 mg total) by mouth daily.  Dispense: 90 capsule; Refill: 1 - ranitidine (ZANTAC) 300 MG tablet; Take 1 tablet (300 mg total) by mouth 2 (two) times daily. Try to take it only at night  Dispense: 180 tablet; Refill: 1  5. Dyslipidemia  - Lipid panel  6. Low TSH level  - Thyroid Panel With TSH Recheck level   7. Hyperglycemia  - Hemoglobin A1c - Insulin, fasting  8. Obesity (BMI 30.0-34.9)  Lost 2 lbs since last visit, discussed life style modification

## 2017-02-09 ENCOUNTER — Ambulatory Visit: Payer: PPO | Admitting: Family Medicine

## 2017-02-09 LAB — INSULIN, FASTING: INSULIN: 8.8 u[IU]/mL (ref 2.0–19.6)

## 2017-02-13 LAB — HEMOGLOBIN A1C
HEMOGLOBIN A1C: 5.7 %{Hb} — AB (ref <5.7)
MEAN PLASMA GLUCOSE: 117 (calc)
eAG (mmol/L): 6.5 (calc)

## 2017-02-13 LAB — CBC WITH DIFFERENTIAL/PLATELET
BASOS ABS: 41 {cells}/uL (ref 0–200)
Basophils Relative: 0.8 %
EOS PCT: 3.5 %
Eosinophils Absolute: 179 cells/uL (ref 15–500)
HCT: 42.7 % (ref 38.5–50.0)
Hemoglobin: 14 g/dL (ref 13.2–17.1)
Lymphs Abs: 1979 cells/uL (ref 850–3900)
MCH: 28.1 pg (ref 27.0–33.0)
MCHC: 32.8 g/dL (ref 32.0–36.0)
MCV: 85.6 fL (ref 80.0–100.0)
MONOS PCT: 8.4 %
MPV: 9.3 fL (ref 7.5–12.5)
NEUTROS PCT: 48.5 %
Neutro Abs: 2474 cells/uL (ref 1500–7800)
Platelets: 198 10*3/uL (ref 140–400)
RBC: 4.99 10*6/uL (ref 4.20–5.80)
RDW: 13.8 % (ref 11.0–15.0)
Total Lymphocyte: 38.8 %
WBC mixed population: 428 cells/uL (ref 200–950)
WBC: 5.1 10*3/uL (ref 3.8–10.8)

## 2017-02-13 LAB — LIPID PANEL
CHOL/HDL RATIO: 2.6 (calc) (ref <5.0)
Cholesterol: 162 mg/dL (ref <200)
HDL: 62 mg/dL (ref >40)
LDL Cholesterol (Calc): 85 mg/dL (calc)
NON-HDL CHOLESTEROL (CALC): 100 mg/dL (ref <130)
Triglycerides: 68 mg/dL (ref <150)

## 2017-02-13 LAB — COMPLETE METABOLIC PANEL WITH GFR
AG RATIO: 1.4 (calc) (ref 1.0–2.5)
ALBUMIN MSPROF: 3.9 g/dL (ref 3.6–5.1)
ALKALINE PHOSPHATASE (APISO): 117 U/L — AB (ref 40–115)
ALT: 17 U/L (ref 9–46)
AST: 19 U/L (ref 10–35)
BILIRUBIN TOTAL: 0.5 mg/dL (ref 0.2–1.2)
BUN: 21 mg/dL (ref 7–25)
CHLORIDE: 106 mmol/L (ref 98–110)
CO2: 24 mmol/L (ref 20–32)
Calcium: 9.3 mg/dL (ref 8.6–10.3)
Creat: 1.06 mg/dL (ref 0.70–1.18)
GFR, EST AFRICAN AMERICAN: 82 mL/min/{1.73_m2} (ref >=60)
GFR, Est Non African American: 71 mL/min/{1.73_m2} (ref >=60)
GLUCOSE: 89 mg/dL (ref 65–99)
Globulin: 2.7 g/dL (calc) (ref 1.9–3.7)
POTASSIUM: 4.3 mmol/L (ref 3.5–5.3)
Sodium: 138 mmol/L (ref 135–146)
TOTAL PROTEIN: 6.6 g/dL (ref 6.1–8.1)

## 2017-02-13 LAB — THYROID PANEL WITH TSH
Free Thyroxine Index: 2.6 (ref 1.4–3.8)
T3 Uptake: 33 % (ref 22–35)
T4, Total: 7.8 ug/dL (ref 4.9–10.5)
TSH: 0.33 mIU/L — ABNORMAL LOW (ref 0.40–4.50)

## 2017-02-13 LAB — INSULIN, FASTING

## 2017-03-05 ENCOUNTER — Ambulatory Visit (INDEPENDENT_AMBULATORY_CARE_PROVIDER_SITE_OTHER): Payer: PPO

## 2017-03-05 DIAGNOSIS — Z23 Encounter for immunization: Secondary | ICD-10-CM | POA: Diagnosis not present

## 2017-07-18 ENCOUNTER — Other Ambulatory Visit: Payer: Self-pay | Admitting: Family Medicine

## 2017-07-18 NOTE — Telephone Encounter (Signed)
Refill Request for Cholesterol medication. Pravastatin to [facility]  Last physical: 02/08/2017   Lab Results  Component Value Date   CHOL 162 02/08/2017   HDL 62 02/08/2017   LDLCALC 83 01/19/2016   TRIG 68 02/08/2017   CHOLHDL 2.6 02/08/2017   Follow up on 08/03/2017

## 2017-08-07 ENCOUNTER — Other Ambulatory Visit: Payer: Self-pay | Admitting: Family Medicine

## 2017-08-07 ENCOUNTER — Ambulatory Visit (INDEPENDENT_AMBULATORY_CARE_PROVIDER_SITE_OTHER): Payer: PPO

## 2017-08-07 VITALS — BP 132/80 | HR 72 | Temp 97.9°F | Resp 12 | Ht 66.0 in | Wt 205.8 lb

## 2017-08-07 DIAGNOSIS — K219 Gastro-esophageal reflux disease without esophagitis: Secondary | ICD-10-CM

## 2017-08-07 DIAGNOSIS — Z0000 Encounter for general adult medical examination without abnormal findings: Secondary | ICD-10-CM

## 2017-08-07 MED ORDER — OMEPRAZOLE 40 MG PO CPDR: 40.0000 mg | DELAYED_RELEASE_CAPSULE | Freq: Every day | ORAL | 1 refills | Status: DC

## 2017-08-07 NOTE — Progress Notes (Addendum)
Subjective:   Dustin Cobb is a 72 y.o. male who presents for Medicare Annual/Subsequent preventive examination.  Review of Systems:  N/A Cardiac Risk Factors include: advanced age (>1men, >75 women);hypertension;dyslipidemia;male gender;obesity (BMI >30kg/m2);sedentary lifestyle     Objective:    Vitals: BP 132/80 (BP Location: Left Arm, Patient Position: Sitting, Cuff Size: Normal)   Pulse 72   Temp 97.9 F (36.6 C) (Oral)   Resp 12   Ht 5\' 6"  (1.676 m)   Wt 205 lb 12.8 oz (93.4 kg)   SpO2 94%   BMI 33.22 kg/m   Body mass index is 33.22 kg/m.  Advanced Directives 08/07/2017 02/08/2017 08/10/2016 01/19/2016 07/22/2015 01/07/2015  Does Patient Have a Medical Advance Directive? No No No No No No  Would patient like information on creating a medical advance directive? Yes (MAU/Ambulatory/Procedural Areas - Information given) - - No - patient declined information - No - patient declined information    Tobacco Social History   Tobacco Use  Smoking Status Former Smoker  . Packs/day: 1.00  . Years: 20.00  . Pack years: 20.00  . Types: Cigarettes  . Start date: 01/07/1975  . Last attempt to quit: 01/07/1995  . Years since quitting: 22.5  Smokeless Tobacco Never Used  Tobacco Comment   smoking cessation materials not required     Counseling given: No Comment: smoking cessation materials not required   Clinical Intake:  Pre-visit preparation completed: Yes  Pain : No/denies pain   BMI - recorded: 33.22 Nutritional Status: BMI > 30  Obese Nutritional Risks: None Diabetes: No  How often do you need to have someone help you when you read instructions, pamphlets, or other written materials from your doctor or pharmacy?: 1 - Never  Interpreter Needed?: No  Information entered by :: AEversole, LPN  Hospitalizations, surgeries, and ER visits occurring within the previous 12 months:  Within the previous 12 months, pt has not underwent any surgical procedures, has  not been hospitalized for any conditions and has not been treated by an emergency room clinician.  Past Medical History:  Diagnosis Date  . Aching headache   . Allergy   . ED (erectile dysfunction)   . GERD (gastroesophageal reflux disease)   . Hyperlipidemia   . Hypertension   . Insomnia   . Metabolic syndrome   . Onychomycosis   . Postural dizziness   . Rash    Past Surgical History:  Procedure Laterality Date  . HEMORRHOID BANDING  04/01/12   Dr. Fleet Contras   Family History  Problem Relation Age of Onset  . Cancer Mother        Thyroid  . Emphysema Father   . Hypertension Sister   . Hypertension Brother   . Healthy Daughter    Social History   Socioeconomic History  . Marital status: Married    Spouse name: Mary  . Number of children: 2  . Years of education: None  . Highest education level: Associate degree: occupational, Hotel manager, or vocational program  Social Needs  . Financial resource strain: Not hard at all  . Food insecurity - worry: Never true  . Food insecurity - inability: Never true  . Transportation needs - medical: No  . Transportation needs - non-medical: No  Occupational History  . Occupation: retired  Tobacco Use  . Smoking status: Former Smoker    Packs/day: 1.00    Years: 20.00    Pack years: 20.00    Types: Cigarettes    Start date:  01/07/1975    Last attempt to quit: 01/07/1995    Years since quitting: 22.5  . Smokeless tobacco: Never Used  . Tobacco comment: smoking cessation materials not required  Substance and Sexual Activity  . Alcohol use: No    Alcohol/week: 0.0 oz  . Drug use: No  . Sexual activity: Not Currently    Partners: Female  Other Topics Concern  . None  Social History Narrative  . None    Outpatient Encounter Medications as of 08/07/2017  Medication Sig  . aspirin 81 MG chewable tablet Chew 1 tablet by mouth daily.  . Cholecalciferol (VITAMIN D3) 1000 UNITS CAPS Take 1 capsule by mouth daily.  . ferrous  sulfate 325 (65 FE) MG tablet Take 1 tablet by mouth daily.  Marland Kitchen loratadine (CLARITIN) 10 MG tablet Take 1 tablet (10 mg total) by mouth daily.  Marland Kitchen omeprazole (PRILOSEC) 40 MG capsule Take 1 capsule (40 mg total) by mouth daily.  . pravastatin (PRAVACHOL) 40 MG tablet TAKE 1 TABLET BY MOUTH DAILY  . ranitidine (ZANTAC) 300 MG tablet Take 1 tablet (300 mg total) by mouth 2 (two) times daily. Try to take it only at night  . triamcinolone cream (KENALOG) 0.1 % Apply 1 application topically 2 (two) times daily.  . fluticasone (FLONASE) 50 MCG/ACT nasal spray Place 2 sprays into both nostrils daily. (Patient not taking: Reported on 02/08/2017)   No facility-administered encounter medications on file as of 08/07/2017.     Activities of Daily Living In your present state of health, do you have any difficulty performing the following activities: 08/07/2017 02/08/2017  Hearing? N N  Comment denies hearing aids -  Vision? N Y  Comment wears eyeglasses -  Difficulty concentrating or making decisions? N N  Walking or climbing stairs? N N  Dressing or bathing? N N  Doing errands, shopping? N N  Preparing Food and eating ? N -  Comment denies dentures -  Using the Toilet? N -  In the past six months, have you accidently leaked urine? N -  Do you have problems with loss of bowel control? N -  Managing your Medications? N -  Managing your Finances? N -  Housekeeping or managing your Housekeeping? N -  Some recent data might be hidden    Patient Care Team: [name], MD as PCP - General (Family Medicine)   Assessment:   This is a routine wellness examination for [place].  Exercise Activities and Dietary recommendations Current Exercise Habits: The patient does not participate in regular exercise at present, Exercise limited by: None identified  Goals    . DIET - INCREASE WATER INTAKE     Recommend to drink at least 6-8 8oz glasses of water per day.       Fall Risk Fall Risk  08/07/2017  02/08/2017 08/10/2016 01/19/2016 07/22/2015  Falls in the past year? No No No No No  Number falls in past yr: - - - - -  Injury with Fall? - - - - -   Is the patient's home free of loose throw rugs in walkways, pet beds, electrical cords, etc?   Yes Does the patient have any grab bars in the bathroom? No  Does the patient use a shower chair when bathing? No Does the patient have any stairs in or around the home? No If so, are there any handrails?  Yes Does the patient have adequate lighting?  Yes Does the patient use a cane, walker or w/c? No  Does the patient use of an elevated toilet seat? No  Timed Get Up and Go Performed: Yes. Pt ambulated 10 feet within 6 sec. Gait stead-fast and without the use of an assistive device. No intervention required at this time. Fall risk prevention has been discussed.  Pt declined my offer to send Community Resource Referral to Care Guide for  installation of grab bars in the shower, shower chair or an elevated toilet seat.  Depression Screen PHQ 2/9 Scores 08/07/2017 02/08/2017 08/10/2016 01/19/2016  PHQ - 2 Score 0 0 0 0  PHQ- 9 Score 0 - - -    Cognitive Function     6CIT Screen 08/07/2017  What Year? 0 points  What month? 0 points  What time? 0 points  Count back from 20 0 points  Months in reverse 0 points  Repeat phrase 4 points  Total Score 4    Immunization History  Administered Date(s) Administered  . Influenza, High Dose Seasonal PF 04/11/2016, 03/05/2017  . Influenza,inj,Quad PF,6+ Mos 01/07/2015  . Influenza-Unspecified 03/02/2014  . Pneumococcal Conjugate-13 01/07/2015  . Pneumococcal Polysaccharide-23 12/18/2013  . Tdap 01/29/2009  . Zoster 07/22/2015    Qualifies for Shingles Vaccine? Yes. Zostavax completed 07/22/15. Due for Shingrix vaccine. Education has been provided regarding the importance of this vaccine. Pt has been advised to call his insurance company to determine his out of pocket expense. Advised he may also receive  this vaccine at his local pharmacy or Health Dept. Verbalized acceptance and understanding.  Screening Tests Health Maintenance  Topic Date Due  . TETANUS/TDAP  01/30/2019  . COLONOSCOPY  12/27/2020  . INFLUENZA VACCINE  Completed  . Hepatitis C Screening  Completed  . PNA vac Low Risk Adult  Completed   Cancer Screenings: Lung: Low Dose CT Chest recommended if Age 82-80 years, 30 pack-year currently smoking OR have quit w/in 15years. Patient does not qualify. Colorectal: Completed colonoscopy 12/28/10. Repeat every 10 years.  Additional Screenings: Hepatitis B/HIV/Syphillis: Does not qualify Hepatitis C Screening: Completed 03/02/14    Plan:  I have personally reviewed and addressed the Medicare Annual Wellness questionnaire and have noted the following in the patient's chart:  A. Medical and social history B. Use of alcohol, tobacco or illicit drugs  C. Current medications and supplements D. Functional ability and status E.  Nutritional status F.  Physical activity G. Advance directives H. List of other physicians I.  Hospitalizations, surgeries, and ER visits in previous 12 months J.  [place] such as hearing and vision if needed, cognitive and depression L. Referrals and appointments - none  In addition, I have reviewed and discussed with patient certain preventive protocols, quality metrics, and best practice recommendations. A written personalized care plan for preventive services as well as general preventive health recommendations were provided to patient.  See attached scanned questionnaire for additional information.   Signed,  Aleatha Borer, LPN Nurse Health Advisor  I have reviewed this encounter including the documentation in this note and/or discussed this patient with the provider, Aleatha Borer, LPN. I am certifying that I agree with the content of this note as supervising physician.  [name], MD [facility] 08/07/2017, 4:22 PM

## 2017-08-07 NOTE — Patient Instructions (Signed)
Dustin Cobb , Thank you for taking time to come for your Medicare Wellness Visit. I appreciate your ongoing commitment to your health goals. Please review the following plan we discussed and let me know if I can assist you in the future.   Screening recommendations/referrals: Colorectal Screening: Completed colonoscopy 12/28/10. Repeat every 10 years. Lung Cancer Screening: You do not qualify for this screening Hepatitis C Screening: Completed 03/02/14 HIV/Syphilis/Hepatitis B Screening: You do not qualify for this screening   Vision/Dental Exams: Recommended yearly ophthalmology/optometry visit for glaucoma screening and checkup Recommended yearly dental visit for hygiene and checkup  Vaccinations: Influenza vaccine: Up to date Pneumococcal vaccine: Completed series Tdap vaccine: Up to date Shingles vaccine: Please call your insurance company to determine your out of pocket expense for the Shingrix vaccine. You may also receive this vaccine at your local pharmacy or Health Dept.   Advanced directives: Advance directive discussed with you today. I have provided a copy for you to complete at home and have notarized. Once this is complete please bring a copy in to our office so we can scan it into your chart.  Conditions/risks identified: Recommend to drink at least 6-8 8oz glasses of water per day.  Next appointment: You are scheduled to see Dr. Ancil Boozer on 08/13/17 @ 8:20am.   Please schedule your Annual Wellness Visit with your Nurse Health Advisor in one year.  Preventive Care 71 Years and Older, Male Preventive care refers to lifestyle choices and visits with your health care provider that can promote health and wellness. What does preventive care include?  A yearly physical exam. This is also called an annual well check.  Dental exams once or twice a year.  Routine eye exams. Ask your health care provider how often you should have your eyes checked.  Personal lifestyle choices,  including:  Daily care of your teeth and gums.  Regular physical activity.  Eating a healthy diet.  Avoiding tobacco and drug use.  Limiting alcohol use.  Practicing safe sex.  Taking low doses of aspirin every day.  Taking vitamin and mineral supplements as recommended by your health care provider. What happens during an annual well check? The services and screenings done by your health care provider during your annual well check will depend on your age, overall health, lifestyle risk factors, and family history of disease. Counseling  Your health care provider may ask you questions about your:  Alcohol use.  Tobacco use.  Drug use.  Emotional well-being.  Home and relationship well-being.  Sexual activity.  Eating habits.  History of falls.  Memory and ability to understand (cognition).  Work and work Statistician. Screening  You may have the following tests or measurements:  Height, weight, and BMI.  Blood pressure.  Lipid and cholesterol levels. These may be checked every 5 years, or more frequently if you are over 62 years old.  Skin check.  Lung cancer screening. You may have this screening every year starting at age 71 if you have a 30-pack-year history of smoking and currently smoke or have quit within the past 15 years.  Fecal occult blood test (FOBT) of the stool. You may have this test every year starting at age 71.  Flexible sigmoidoscopy or colonoscopy. You may have a sigmoidoscopy every 5 years or a colonoscopy every 10 years starting at age 71.  Prostate cancer screening. Recommendations will vary depending on your family history and other risks.  Hepatitis C blood test.  Hepatitis B blood test.  Sexually transmitted disease (STD) testing.  Diabetes screening. This is done by checking your blood sugar (glucose) after you have not eaten for a while (fasting). You may have this done every 1-3 years.  Abdominal aortic aneurysm (AAA)  screening. You may need this if you are a current or former smoker.  Osteoporosis. You may be screened starting at age 71 if you are at high risk. Talk with your health care provider about your test results, treatment options, and if necessary, the need for more tests. Vaccines  Your health care provider may recommend certain vaccines, such as:  Influenza vaccine. This is recommended every year.  Tetanus, diphtheria, and acellular pertussis (Tdap, Td) vaccine. You may need a Td booster every 10 years.  Zoster vaccine. You may need this after age 71.  Pneumococcal 13-valent conjugate (PCV13) vaccine. One dose is recommended after age 71.  Pneumococcal polysaccharide (PPSV23) vaccine. One dose is recommended after age 71. Talk to your health care provider about which screenings and vaccines you need and how often you need them. This information is not intended to replace advice given to you by your health care provider. Make sure you discuss any questions you have with your health care provider. Document Released: 06/11/2015 Document Revised: 02/02/2016 Document Reviewed: 03/16/2015 Elsevier Interactive Patient Education  2017 [place] Prevention in the Home Falls can cause injuries. They can happen to people of all ages. There are many things you can do to make your home safe and to help prevent falls. What can I do on the outside of my home?  Regularly fix the edges of walkways and driveways and fix any cracks.  Remove anything that might make you trip as you walk through a door, such as a raised step or threshold.  Trim any bushes or trees on the path to your home.  Use bright outdoor lighting.  Clear any walking paths of anything that might make someone trip, such as rocks or tools.  Regularly check to see if handrails are loose or broken. Make sure that both sides of any steps have handrails.  Any raised decks and porches should have guardrails on the  edges.  Have any leaves, snow, or ice cleared regularly.  Use sand or salt on walking paths during winter.  Clean up any spills in your garage right away. This includes oil or grease spills. What can I do in the bathroom?  Use night lights.  Install grab bars by the toilet and in the tub and shower. Do not use towel bars as grab bars.  Use non-skid mats or decals in the tub or shower.  If you need to sit down in the shower, use a plastic, non-slip stool.  Keep the floor dry. Clean up any water that spills on the floor as soon as it happens.  Remove soap buildup in the tub or shower regularly.  Attach bath mats securely with double-sided non-slip rug tape.  Do not have throw rugs and other things on the floor that can make you trip. What can I do in the bedroom?  Use night lights.  Make sure that you have a light by your bed that is easy to reach.  Do not use any sheets or blankets that are too big for your bed. They should not hang down onto the floor.  Have a firm chair that has side arms. You can use this for support while you get dressed.  Do not have throw rugs and other  things on the floor that can make you trip. What can I do in the kitchen?  Clean up any spills right away.  Avoid walking on wet floors.  Keep items that you use a lot in easy-to-reach places.  If you need to reach something above you, use a strong step stool that has a grab bar.  Keep electrical cords out of the way.  Do not use floor polish or wax that makes floors slippery. If you must use wax, use non-skid floor wax.  Do not have throw rugs and other things on the floor that can make you trip. What can I do with my stairs?  Do not leave any items on the stairs.  Make sure that there are handrails on both sides of the stairs and use them. Fix handrails that are broken or loose. Make sure that handrails are as long as the stairways.  Check any carpeting to make sure that it is firmly  attached to the stairs. Fix any carpet that is loose or worn.  Avoid having throw rugs at the top or bottom of the stairs. If you do have throw rugs, attach them to the floor with carpet tape.  Make sure that you have a light switch at the top of the stairs and the bottom of the stairs. If you do not have them, ask someone to add them for you. What else can I do to help prevent falls?  Wear shoes that:  Do not have high heels.  Have rubber bottoms.  Are comfortable and fit you well.  Are closed at the toe. Do not wear sandals.  If you use a stepladder:  Make sure that it is fully opened. Do not climb a closed stepladder.  Make sure that both sides of the stepladder are locked into place.  Ask someone to hold it for you, if possible.  Clearly mark and make sure that you can see:  Any grab bars or handrails.  First and last steps.  Where the edge of each step is.  Use tools that help you move around (mobility aids) if they are needed. These include:  Canes.  Walkers.  Scooters.  Crutches.  Turn on the lights when you go into a dark area. Replace any light bulbs as soon as they burn out.  Set up your furniture so you have a clear path. Avoid moving your furniture around.  If any of your floors are uneven, fix them.  If there are any pets around you, be aware of where they are.  Review your medicines with your doctor. Some medicines can make you feel dizzy. This can increase your chance of falling. Ask your doctor what other things that you can do to help prevent falls. This information is not intended to replace advice given to you by your health care provider. Make sure you discuss any questions you have with your health care provider. Document Released: 03/11/2009 Document Revised: 10/21/2015 Document Reviewed: 06/19/2014 Elsevier Interactive Patient Education  2017 [employer].

## 2017-08-07 NOTE — Telephone Encounter (Signed)
Pt is needing his acid reflux medication refilled. Pt has an CPE on 08-13-17. Pharm is [facility]

## 2017-08-13 ENCOUNTER — Ambulatory Visit (INDEPENDENT_AMBULATORY_CARE_PROVIDER_SITE_OTHER): Payer: PPO | Admitting: Family Medicine

## 2017-08-13 ENCOUNTER — Encounter: Payer: Self-pay | Admitting: Family Medicine

## 2017-08-13 VITALS — BP 122/70 | HR 77 | Temp 98.0°F | Resp 14 | Ht 66.25 in | Wt 204.0 lb

## 2017-08-13 DIAGNOSIS — Z0000 Encounter for general adult medical examination without abnormal findings: Secondary | ICD-10-CM | POA: Diagnosis not present

## 2017-08-13 DIAGNOSIS — G4733 Obstructive sleep apnea (adult) (pediatric): Secondary | ICD-10-CM

## 2017-08-13 DIAGNOSIS — Z862 Personal history of diseases of the blood and blood-forming organs and certain disorders involving the immune mechanism: Secondary | ICD-10-CM

## 2017-08-13 DIAGNOSIS — J3089 Other allergic rhinitis: Secondary | ICD-10-CM

## 2017-08-13 DIAGNOSIS — E669 Obesity, unspecified: Secondary | ICD-10-CM

## 2017-08-13 DIAGNOSIS — E8881 Metabolic syndrome: Secondary | ICD-10-CM | POA: Diagnosis not present

## 2017-08-13 DIAGNOSIS — R7989 Other specified abnormal findings of blood chemistry: Secondary | ICD-10-CM | POA: Diagnosis not present

## 2017-08-13 DIAGNOSIS — I10 Essential (primary) hypertension: Secondary | ICD-10-CM | POA: Diagnosis not present

## 2017-08-13 DIAGNOSIS — Z23 Encounter for immunization: Secondary | ICD-10-CM

## 2017-08-13 DIAGNOSIS — K219 Gastro-esophageal reflux disease without esophagitis: Secondary | ICD-10-CM

## 2017-08-13 DIAGNOSIS — R739 Hyperglycemia, unspecified: Secondary | ICD-10-CM | POA: Diagnosis not present

## 2017-08-13 DIAGNOSIS — L309 Dermatitis, unspecified: Secondary | ICD-10-CM

## 2017-08-13 DIAGNOSIS — E785 Hyperlipidemia, unspecified: Secondary | ICD-10-CM | POA: Diagnosis not present

## 2017-08-13 DIAGNOSIS — J302 Other seasonal allergic rhinitis: Secondary | ICD-10-CM

## 2017-08-13 DIAGNOSIS — Z9989 Dependence on other enabling machines and devices: Secondary | ICD-10-CM

## 2017-08-13 MED ORDER — ZOSTER VAC RECOMB ADJUVANTED 50 MCG/0.5ML IM SUSR: 0.5000 mL | Freq: Once | INTRAMUSCULAR | 1 refills | Status: AC

## 2017-08-13 MED ORDER — RANITIDINE HCL 300 MG PO TABS: 300.0000 mg | ORAL_TABLET | Freq: Two times a day (BID) | ORAL | 1 refills | Status: DC

## 2017-08-13 NOTE — Addendum Note (Signed)
Addended by: [name] F on: 08/13/2017 09:16 AM   Modules accepted: Level of Service

## 2017-08-13 NOTE — Patient Instructions (Signed)

## 2017-08-13 NOTE — Progress Notes (Signed)
Name: Dustin Cobb   MRN: [id number]    DOB: June 05, 1945   Date:08/13/2017       Progress Note  Subjective  Chief Complaint  Chief Complaint  Patient presents with  . Annual Exam    HPI  Physical Exam: he is married, sexually active - no problems with ED AUA mild symptoms. Discussed visit for medicare wellness done last week with patient. He states he will bring living will forms on his next visit   IPSS Questionnaire (AUA-7): Over the past month.   1)  How often have you had a sensation of not emptying your bladder completely after you finish urinating?  1 - Less than 1 time in 5  2)  How often have you had to urinate again less than two hours after you finished urinating? 2 - Less than half the time  3)  How often have you found you stopped and started again several times when you urinated?  0 - Not at all  4) How difficult have you found it to postpone urination?  0 - Not at all  5) How often have you had a weak urinary stream?  4 - More than half the time  6) How often have you had to push or strain to begin urination?  0 - Not at all  7) How many times did you most typically get up to urinate from the time you went to bed until the time you got up in the morning?  0 - None  Total score:  0-7 mildly symptomatic   8-19 moderately symptomatic   20-35 severely symptomatic   Hyperlipidemia: taking Pravastatin, no side effects, no chest pain or myalgias, last labs were normal  HTN: he stopped taking medication n 2017 because bp has been at goal, since retirement.  No chest pain or SOB. BP is well controlled.   Hyperglycemia: he lost some weight since last visit, hgbA1C 6 months ago had improved down to 5.7 % No polyphagia, polydipsia or polyuria . He has not changed his diet, but is eating smaller portions. Still carrying for a toddler - that daughter is raising.  Low TSH: seen by Dr. Gabriel Carina, TSH was back to normal - reviewed labs done at [facility]. She advised yearly follow up labs  here in our office, we will recheck again today since last levels was still slightly low . He denies palpitation, no significant weight loss, no diarrhea.   GERD: under control with medication, still not following a diet, he continues to drink sodas and tea daily, but only buying the 8 ounces cans. Explained risk of PPI and H2 blocker use., he has not gone down on Ranitidine dose yet.   OSA: wearing CPAP about 3-4 nights a week for about 6 hours. He wakes up feeling rested most days. He states he skips days because the pressure seems to bother his sinuses.   Obesity: he will cut down on portions and exercise more. His weight has gone down  since last visit.  AR: on nasal steroid and claritin, still has intermittent symptoms that he thinks it is related to CPAP machine use.   Patient Active Problem List   Diagnosis Date Noted  . Rash of hands 08/10/2016  . Hypertension, essential, benign 01/07/2015  . GERD (gastroesophageal reflux disease) 01/07/2015  . OSA on CPAP 01/07/2015  . Low TSH level 01/07/2015  . Hyperglycemia 01/07/2015  . Obesity (BMI 30.0-34.9) 01/07/2015  . Dyslipidemia 01/07/2015  . History of hemorrhoidectomy 01/07/2015  .  History of iron deficiency anemia 01/07/2015  . Allergic rhinitis 01/07/2015  . Metabolic syndrome 46/96/2952    Past Surgical History:  Procedure Laterality Date  . HEMORRHOID BANDING  04/01/12   Dr. Fleet Contras    Family History  Problem Relation Age of Onset  . Cancer Mother        Thyroid  . Emphysema Father   . Hypertension Sister   . Hypertension Brother   . Healthy Daughter     Social History   Socioeconomic History  . Marital status: Married    Spouse name: Mary  . Number of children: 2  . Years of education: Not on file  . Highest education level: Associate degree: occupational, Hotel manager, or vocational program  Social Needs  . Financial resource strain: Not hard at all  . Food insecurity - worry: Never true  . Food  insecurity - inability: Never true  . Transportation needs - medical: No  . Transportation needs - non-medical: No  Occupational History  . Occupation: retired  Tobacco Use  . Smoking status: Former Smoker    Packs/day: 1.00    Years: 20.00    Pack years: 20.00    Types: Cigarettes    Start date: 01/07/1975    Last attempt to quit: 01/07/1995    Years since quitting: 22.6  . Smokeless tobacco: Never Used  . Tobacco comment: smoking cessation materials not required  Substance and Sexual Activity  . Alcohol use: No    Alcohol/week: 0.0 oz  . Drug use: No  . Sexual activity: Yes    Partners: Female  Other Topics Concern  . Not on file  Social History Narrative  . Not on file     Current Outpatient Medications:  .  aspirin 81 MG chewable tablet, Chew 1 tablet by mouth daily., Disp: , Rfl:  .  Cholecalciferol (VITAMIN D3) 1000 UNITS CAPS, Take 1 capsule by mouth daily., Disp: , Rfl:  .  ferrous sulfate 325 (65 FE) MG tablet, Take 1 tablet by mouth daily., Disp: , Rfl:  .  fluticasone (FLONASE) 50 MCG/ACT nasal spray, Place 2 sprays into both nostrils daily., Disp: 16 g, Rfl: 5 .  loratadine (CLARITIN) 10 MG tablet, Take 1 tablet (10 mg total) by mouth daily., Disp: 30 tablet, Rfl: 5 .  omeprazole (PRILOSEC) 40 MG capsule, Take 1 capsule (40 mg total) by mouth daily., Disp: 90 capsule, Rfl: 1 .  pravastatin (PRAVACHOL) 40 MG tablet, TAKE 1 TABLET BY MOUTH DAILY, Disp: 90 tablet, Rfl: 1 .  ranitidine (ZANTAC) 300 MG tablet, Take 1 tablet (300 mg total) by mouth 2 (two) times daily. Try to take it only at night, Disp: 180 tablet, Rfl: 1 .  triamcinolone cream (KENALOG) 0.1 %, Apply 1 application topically 2 (two) times daily., Disp: 80 g, Rfl: 0  No Known Allergies   ROS  Constitutional: Negative for fever , positive for mild  weight change.  Respiratory: Positive  for cough ( started today with post-nasal drainage) but no shortness of breath.   Cardiovascular: Negative for chest  pain or palpitations.  Gastrointestinal: Negative for abdominal pain, no bowel changes.  Musculoskeletal: Negative for gait problem or joint swelling.  Skin: Positive  for rash.  Neurological: Negative for dizziness or headache.  No other specific complaints in a complete review of systems (except as listed in HPI above).  Objective  Vitals:   08/13/17 0834  BP: 122/70  Pulse: 77  Resp: 14  Temp: 98 F (36.7 C)  TempSrc: Oral  SpO2: 98%  Weight: 204 lb (92.5 kg)  Height: 5' 6.25" (1.683 m)    Body mass index is 32.68 kg/m.  Physical Exam  Constitutional: Patient appears well-developed and well-nourished. No distress.  HENT: Head: Normocephalic and atraumatic. Ears: B TMs ok, no erythema or effusion; Nose: Nose normal. Mouth/Throat: Oropharynx is clear and moist. No oropharyngeal exudate.  Eyes: Conjunctivae and EOM are normal. Pupils are equal, round, and reactive to light. No scleral icterus.  Neck: Normal range of motion. Neck supple. No JVD present. No thyromegaly present.  Cardiovascular: Normal rate, regular rhythm and normal heart sounds.  No murmur heard. No BLE edema. Pulmonary/Chest: Effort normal and breath sounds normal. No respiratory distress. Abdominal: Soft. Bowel sounds are normal, no distension. There is no tenderness. no masses MALE GENITALIA: Normal descended testes bilaterally, no masses palpated, no hernias, no lesions, no discharge RECTAL: Prostate normal size and consistency, no rectal masses Musculoskeletal: Normal range of motion, no joint effusions. No gross deformities Neurological: he is alert and oriented to person, place, and time. No cranial nerve deficit. Coordination, balance, strength, speech and gait are normal.  Skin: Skin is warm and dry. He has a rash on dorsal aspect of both hands, worse on knuckle area. Scaly and dry, no oozing in plaques,  Psychiatric: Patient has a normal mood and affect. behavior is normal. Judgment and thought content  normal.   PHQ2/9: Depression screen [facility] 2/9 08/13/2017 08/07/2017 02/08/2017 08/10/2016 01/19/2016  Decreased Interest 0 0 0 0 0  Down, Depressed, Hopeless 0 0 0 0 0  PHQ - 2 Score 0 0 0 0 0  Altered sleeping - 0 - - -  Tired, decreased energy - 0 - - -  Change in appetite - 0 - - -  Feeling bad or failure about yourself  - 0 - - -  Trouble concentrating - 0 - - -  Moving slowly or fidgety/restless - 0 - - -  Suicidal thoughts - 0 - - -  PHQ-9 Score - 0 - - -     Fall Risk: Fall Risk  08/13/2017 08/07/2017 02/08/2017 08/10/2016 01/19/2016  Falls in the past year? No No No No No  Number falls in past yr: - - - - -  Injury with Fall? - - - - -     Functional Status Survey: Is the patient deaf or have difficulty hearing?: No Does the patient have difficulty seeing, even when wearing glasses/contacts?: No Does the patient have difficulty concentrating, remembering, or making decisions?: No Does the patient have difficulty walking or climbing stairs?: No Does the patient have difficulty dressing or bathing?: No Does the patient have difficulty doing errands alone such as visiting a doctor's office or shopping?: No   Assessment & Plan  1. Physical exam, annual  Discussed importance of 150 minutes of physical activity weekly, eat two servings of fish weekly, eat one serving of tree nuts ( cashews, pistachios, pecans, almonds.Marland Kitchen) every other day, eat 6 servings of fruit/vegetables daily and drink plenty of water and avoid sweet beverages.   2. Gastroesophageal reflux disease without esophagitis  - ranitidine (ZANTAC) 300 MG tablet; Take 1 tablet (300 mg total) by mouth 2 (two) times daily. Try to take it only at night  Dispense: 180 tablet; Refill: 1  3. Dyslipidemia  Continue medication  4. Low TSH level  - Thyroid Panel With TSH  5. Hypertension, essential, benign  Controlled with life style modification  - COMPLETE METABOLIC PANEL  WITH GFR  6. Metabolic syndrome   7.  Obesity (BMI 30.0-34.9)  Discussed with the patient the risk posed by an increased BMI. Discussed importance of portion control, calorie counting and at least 150 minutes of physical activity weekly. Avoid sweet beverages and drink more water. Eat at least 6 servings of fruit and vegetables daily   8. OSA on CPAP  Discussed need to use CPAP every night   9. History of iron deficiency anemia  Last labs were normal   10. Perennial allergic rhinitis with seasonal variation  Continue medication   11. Chronic dermatitis of hands  - Ambulatory referral to Dermatology  12. Hyperglycemia  - Hemoglobin A1c  13. Need for shingles vaccine  - Zoster Vaccine Adjuvanted [facility]) injection; Inject 0.5 mLs into the muscle once for 1 dose.  Dispense: 0.5 mL; Refill: 1

## 2017-08-14 LAB — HEMOGLOBIN A1C
EAG (MMOL/L): 6.6 (calc)
Hgb A1c MFr Bld: 5.8 % of total Hgb — ABNORMAL HIGH (ref <5.7)
Mean Plasma Glucose: 120 (calc)

## 2017-08-14 LAB — COMPLETE METABOLIC PANEL WITH GFR
AG Ratio: 1.5 (calc) (ref 1.0–2.5)
ALKALINE PHOSPHATASE (APISO): 107 U/L (ref 40–115)
ALT: 15 U/L (ref 9–46)
AST: 20 U/L (ref 10–35)
Albumin: 4.1 g/dL (ref 3.6–5.1)
BILIRUBIN TOTAL: 0.7 mg/dL (ref 0.2–1.2)
BUN: 18 mg/dL (ref 7–25)
CHLORIDE: 107 mmol/L (ref 98–110)
CO2: 28 mmol/L (ref 20–32)
Calcium: 9.4 mg/dL (ref 8.6–10.3)
Creat: 1.03 mg/dL (ref 0.70–1.18)
GFR, Est African American: 85 mL/min/{1.73_m2} (ref >=60)
GFR, Est Non African American: 73 mL/min/{1.73_m2} (ref >=60)
GLOBULIN: 2.8 g/dL (ref 1.9–3.7)
Glucose, Bld: 95 mg/dL (ref 65–99)
Potassium: 4.5 mmol/L (ref 3.5–5.3)
SODIUM: 138 mmol/L (ref 135–146)
Total Protein: 6.9 g/dL (ref 6.1–8.1)

## 2017-08-14 LAB — THYROID PANEL WITH TSH
FREE THYROXINE INDEX: 2.5 (ref 1.4–3.8)
T3 UPTAKE: 32 % (ref 22–35)
T4 TOTAL: 7.7 ug/dL (ref 4.9–10.5)
TSH: 0.27 mIU/L — ABNORMAL LOW (ref 0.40–4.50)

## 2017-08-29 DIAGNOSIS — R21 Rash and other nonspecific skin eruption: Secondary | ICD-10-CM | POA: Diagnosis not present

## 2017-08-29 DIAGNOSIS — L439 Lichen planus, unspecified: Secondary | ICD-10-CM | POA: Diagnosis not present

## 2017-08-29 DIAGNOSIS — L308 Other specified dermatitis: Secondary | ICD-10-CM | POA: Diagnosis not present

## 2017-08-29 DIAGNOSIS — B354 Tinea corporis: Secondary | ICD-10-CM | POA: Diagnosis not present

## 2017-09-18 DIAGNOSIS — L280 Lichen simplex chronicus: Secondary | ICD-10-CM | POA: Diagnosis not present

## 2017-12-14 DIAGNOSIS — G4733 Obstructive sleep apnea (adult) (pediatric): Secondary | ICD-10-CM | POA: Diagnosis not present

## 2018-01-17 ENCOUNTER — Other Ambulatory Visit: Payer: Self-pay | Admitting: Family Medicine

## 2018-01-17 NOTE — Telephone Encounter (Signed)
Refill Request for Cholesterol medication. Pravastatin to [facility]  Last visit: 08/13/2017   Lab Results  Component Value Date   CHOL 162 02/08/2017   HDL 62 02/08/2017   LDLCALC 85 02/08/2017   TRIG 68 02/08/2017   CHOLHDL 2.6 02/08/2017    Follow up on 02/13/2018

## 2018-02-13 ENCOUNTER — Ambulatory Visit: Payer: PPO | Admitting: Family Medicine

## 2018-02-13 ENCOUNTER — Encounter: Payer: Self-pay | Admitting: Family Medicine

## 2018-02-13 VITALS — BP 128/84 | HR 66 | Temp 97.7°F | Resp 16 | Ht 66.0 in | Wt 203.6 lb

## 2018-02-13 DIAGNOSIS — R739 Hyperglycemia, unspecified: Secondary | ICD-10-CM | POA: Diagnosis not present

## 2018-02-13 DIAGNOSIS — R7989 Other specified abnormal findings of blood chemistry: Secondary | ICD-10-CM

## 2018-02-13 DIAGNOSIS — Z23 Encounter for immunization: Secondary | ICD-10-CM | POA: Diagnosis not present

## 2018-02-13 DIAGNOSIS — E785 Hyperlipidemia, unspecified: Secondary | ICD-10-CM | POA: Diagnosis not present

## 2018-02-13 DIAGNOSIS — I10 Essential (primary) hypertension: Secondary | ICD-10-CM

## 2018-02-13 DIAGNOSIS — G4733 Obstructive sleep apnea (adult) (pediatric): Secondary | ICD-10-CM | POA: Diagnosis not present

## 2018-02-13 DIAGNOSIS — L280 Lichen simplex chronicus: Secondary | ICD-10-CM

## 2018-02-13 DIAGNOSIS — Z862 Personal history of diseases of the blood and blood-forming organs and certain disorders involving the immune mechanism: Secondary | ICD-10-CM | POA: Diagnosis not present

## 2018-02-13 DIAGNOSIS — E8881 Metabolic syndrome: Secondary | ICD-10-CM | POA: Diagnosis not present

## 2018-02-13 DIAGNOSIS — K219 Gastro-esophageal reflux disease without esophagitis: Secondary | ICD-10-CM

## 2018-02-13 DIAGNOSIS — Z9989 Dependence on other enabling machines and devices: Secondary | ICD-10-CM

## 2018-02-13 DIAGNOSIS — J302 Other seasonal allergic rhinitis: Secondary | ICD-10-CM

## 2018-02-13 DIAGNOSIS — E559 Vitamin D deficiency, unspecified: Secondary | ICD-10-CM | POA: Diagnosis not present

## 2018-02-13 DIAGNOSIS — J3089 Other allergic rhinitis: Secondary | ICD-10-CM | POA: Diagnosis not present

## 2018-02-13 MED ORDER — PRAVASTATIN SODIUM 40 MG PO TABS: 40.0000 mg | ORAL_TABLET | Freq: Every day | ORAL | 1 refills | Status: DC

## 2018-02-13 MED ORDER — OMEPRAZOLE 40 MG PO CPDR: 40.0000 mg | DELAYED_RELEASE_CAPSULE | Freq: Every day | ORAL | 1 refills | Status: DC

## 2018-02-13 MED ORDER — FAMOTIDINE 20 MG PO TABS: 20.0000 mg | ORAL_TABLET | Freq: Two times a day (BID) | ORAL | 1 refills | Status: DC

## 2018-02-13 NOTE — Progress Notes (Signed)
Name: Dustin Cobb   MRN: [id number]    DOB: 1945/12/19   Date:02/13/2018       Progress Note  Subjective  Chief Complaint  Chief Complaint  Patient presents with  . Follow-up    6 mth f/u  . Gastroesophageal Reflux  . Dyslipidemia  . Low TSH level  . Hypertension    Denies any symptoms  . Metabolic Syndrome  . Obesity  . Sleep Apnea  . History of iron deficiency anemia  . Allergic Rhinitis   . Dermatitis  . Hyperglycemia  . Immunizations    high dose  . Labs Only    HPI  Hyperlipidemia: taking Pravastatin, no side effects, no chest pain or myalgias,recheck labs today   HTN:he stopped taking medication n 2017 because bp has been at goal, since retirement.No chest pain or SOB. BP is well controlled. Calculated his ASCVD risk is 10.8% and risk of bleeding 7.8%, and it is not indicated for him to continue aspirin daily, he will finish what he has and stop it   Hyperglycemia: hgbA1C 6 months ago had improved down to 5.8 % No polyphagia, polydipsia or polyuria. He has been eating healthier, more vegetables.   Low TSH: seen by Dr. Gabriel Carina, [facility] to normal  She advised yearly follow up labs here in our office, we will recheck again today . He denies palpitation, no significant weight loss, no diarrhea.   GERD: under control with medication, still not following a diet,he continues todrink sodas and tea daily, but only buying the 8 ounces cans. Explained risk of PPI and H2 blocker use., because of recent recall of Ranitidine we will switch to Pepcid and monitor   OSA: wearing CPAP about 3-4 nights a week for about 6 hours. He wakes up feeling restedmost days. He states he skips days because the pressure seems to bother his sinuses, he states he got a new mask and seems to be working a little better.   Obesity: discussed trying to go down to below 200 lbs. He is more aware of portion sizes, he is eating more vegetables.   AR: on nasal steroid and claritin,  still has intermittent symptoms. Stable .    Patient Active Problem List   Diagnosis Date Noted  . Rash of hands 08/10/2016  . Hypertension, essential, benign 01/07/2015  . GERD (gastroesophageal reflux disease) 01/07/2015  . OSA on CPAP 01/07/2015  . Low TSH level 01/07/2015  . Hyperglycemia 01/07/2015  . Obesity (BMI 30.0-34.9) 01/07/2015  . Dyslipidemia 01/07/2015  . History of hemorrhoidectomy 01/07/2015  . History of iron deficiency anemia 01/07/2015  . Allergic rhinitis 01/07/2015  . Metabolic syndrome 33/82/5053    Past Surgical History:  Procedure Laterality Date  . HEMORRHOID BANDING  04/01/12   Dr. Fleet Contras    Family History  Problem Relation Age of Onset  . Cancer Mother        Thyroid  . Emphysema Father   . COPD Father   . Hypertension Sister   . Hypertension Brother   . Heart disease Brother   . Healthy Daughter     Social History   Socioeconomic History  . Marital status: Married    Spouse name: Mary  . Number of children: 2  . Years of education: Not on file  . Highest education level: Associate degree: occupational, Hotel manager, or vocational program  Occupational History  . Occupation: retired  Scientific laboratory technician  . Financial resource strain: Not hard at all  . Food insecurity:  Worry: Never true    Inability: Never true  . Transportation needs:    Medical: No    Non-medical: No  Tobacco Use  . Smoking status: Former Smoker    Packs/day: 1.00    Years: 20.00    Pack years: 20.00    Types: Cigarettes    Start date: 01/07/1975    Last attempt to quit: 01/07/1995    Years since quitting: 23.1  . Smokeless tobacco: Never Used  . Tobacco comment: smoking cessation materials not required  Substance and Sexual Activity  . Alcohol use: No    Alcohol/week: 0.0 standard drinks  . Drug use: No  . Sexual activity: Yes    Partners: Female  Lifestyle  . Physical activity:    Days per week: 0 days    Minutes per session: 0 min  . Stress: Not at  all  Relationships  . Social connections:    Talks on phone: Patient refused    Gets together: Patient refused    Attends religious service: Patient refused    Active member of club or organization: Patient refused    Attends meetings of clubs or organizations: Patient refused    Relationship status: Married  . Intimate partner violence:    Fear of current or ex partner: No    Emotionally abused: No    Physically abused: No    Forced sexual activity: No  Other Topics Concern  . Not on file  Social History Narrative  . Not on file     Current Outpatient Medications:  .  Cholecalciferol (VITAMIN D3) 1000 UNITS CAPS, Take 1 capsule by mouth daily., Disp: , Rfl:  .  ferrous sulfate 325 (65 FE) MG tablet, Take 1 tablet by mouth daily., Disp: , Rfl:  .  fluticasone (FLONASE) 50 MCG/ACT nasal spray, Place 2 sprays into both nostrils daily., Disp: 16 g, Rfl: 5 .  loratadine (CLARITIN) 10 MG tablet, Take 1 tablet (10 mg total) by mouth daily., Disp: 30 tablet, Rfl: 5 .  omeprazole (PRILOSEC) 40 MG capsule, Take 1 capsule (40 mg total) by mouth daily., Disp: 90 capsule, Rfl: 1 .  pravastatin (PRAVACHOL) 40 MG tablet, Take 1 tablet (40 mg total) by mouth daily., Disp: 90 tablet, Rfl: 1 .  triamcinolone cream (KENALOG) 0.1 %, Apply 1 application topically 2 (two) times daily., Disp: 80 g, Rfl: 0 .  famotidine (PEPCID) 20 MG tablet, Take 1 tablet (20 mg total) by mouth 2 (two) times daily., Disp: 180 tablet, Rfl: 1  No Known Allergies  I personally reviewed active problem list, medication list, allergies, family history, social history with the patient/caregiver today.   ROS  Constitutional: Negative for fever or weight change.  Respiratory: Negative for cough and shortness of breath.   Cardiovascular: Negative for chest pain or palpitations.  Gastrointestinal: Negative for abdominal pain, no bowel changes.  Musculoskeletal: Negative for gait problem or joint swelling.  Skin: positive  for rash on left hand, seen by dermatologist   Neurological: Negative for dizziness, no recent episodes of  headache.  No other specific complaints in a complete review of systems (except as listed in HPI above).  Objective  Vitals:   02/13/18 0908  BP: 128/84  Pulse: 66  Resp: 16  Temp: 97.7 F (36.5 C)  TempSrc: Oral  SpO2: 96%  Weight: 203 lb 9.6 oz (92.4 kg)  Height: 5\' 6"  (1.676 m)    Body mass index is 32.86 kg/m.  Physical Exam  Constitutional: Patient appears  well-developed and well-nourished. Obese  No distress.  HEENT: head atraumatic, normocephalic, pupils equal and reactive to light, neck supple, throat within normal limits Cardiovascular: Normal rate, regular rhythm and normal heart sounds.  No murmur heard. No BLE edema. Pulmonary/Chest: Effort normal and breath sounds normal. No respiratory distress. Abdominal: Soft.  There is no tenderness. Psychiatric: Patient has a normal mood and affect. behavior is normal. Judgment and thought content normal.   PHQ2/9: Depression screen [facility] 2/9 02/13/2018 08/13/2017 08/07/2017 02/08/2017 08/10/2016  Decreased Interest 0 0 0 0 0  Down, Depressed, Hopeless 0 0 0 0 0  PHQ - 2 Score 0 0 0 0 0  Altered sleeping - - 0 - -  Tired, decreased energy - - 0 - -  Change in appetite - - 0 - -  Feeling bad or failure about yourself  - - 0 - -  Trouble concentrating - - 0 - -  Moving slowly or fidgety/restless - - 0 - -  Suicidal thoughts - - 0 - -  PHQ-9 Score - - 0 - -     Fall Risk: Fall Risk  02/13/2018 08/13/2017 08/07/2017 02/08/2017 08/10/2016  Falls in the past year? No No No No No  Number falls in past yr: - - - - -  Injury with Fall? - - - - -     Functional Status Survey: Is the patient deaf or have difficulty hearing?: No Does the patient have difficulty seeing, even when wearing glasses/contacts?: Yes(reading glasses) Does the patient have difficulty concentrating, remembering, or making decisions?: No Does the  patient have difficulty walking or climbing stairs?: No Does the patient have difficulty dressing or bathing?: No Does the patient have difficulty doing errands alone such as visiting a doctor's office or shopping?: No    Assessment & Plan   1. Hypertension, essential, benign  - CBC with Differential/Platelet - COMPLETE METABOLIC PANEL WITH GFR  2. Need for influenza vaccination  - Flu vaccine HIGH DOSE PF  3. Gastroesophageal reflux disease without esophagitis  - omeprazole (PRILOSEC) 40 MG capsule; Take 1 capsule (40 mg total) by mouth daily.  Dispense: 90 capsule; Refill: 1  4. Low TSH level  - TSH  5. Dyslipidemia  - Lipid panel  6. History of iron deficiency anemia   7. Metabolic syndrome  Recheck hgbA1C   8. Hyperglycemia  - Hemoglobin A1c  9. Perennial allergic rhinitis with seasonal variation  Continue otc medication   10. OSA on CPAP  Continue cpap, but needs to wear it more often   11. Vitamin D deficiency  - VITAMIN D 25 Hydroxy (Vit-D Deficiency, Fractures)  12. Morbid obesity ([place])  Discussed with the patient the risk posed by an increased BMI. Discussed importance of portion control, calorie counting and at least 150 minutes of physical activity weekly. Avoid sweet beverages and drink more water. Eat at least 6 servings of fruit and vegetables daily

## 2018-02-14 ENCOUNTER — Other Ambulatory Visit: Payer: Self-pay | Admitting: Family Medicine

## 2018-02-14 DIAGNOSIS — E039 Hypothyroidism, unspecified: Secondary | ICD-10-CM

## 2018-02-14 DIAGNOSIS — R7989 Other specified abnormal findings of blood chemistry: Secondary | ICD-10-CM

## 2018-02-14 DIAGNOSIS — E038 Other specified hypothyroidism: Secondary | ICD-10-CM

## 2018-02-14 LAB — LIPID PANEL
CHOL/HDL RATIO: 2.8 (calc) (ref <5.0)
CHOLESTEROL: 175 mg/dL (ref <200)
HDL: 62 mg/dL (ref >40)
LDL CHOLESTEROL (CALC): 99 mg/dL
Non-HDL Cholesterol (Calc): 113 mg/dL (calc) (ref <130)
Triglycerides: 56 mg/dL (ref <150)

## 2018-02-14 LAB — COMPLETE METABOLIC PANEL WITH GFR
AG RATIO: 1.5 (calc) (ref 1.0–2.5)
ALBUMIN MSPROF: 4.1 g/dL (ref 3.6–5.1)
ALT: 18 U/L (ref 9–46)
AST: 19 U/L (ref 10–35)
Alkaline phosphatase (APISO): 105 U/L (ref 40–115)
BUN/Creatinine Ratio: 25 (calc) — ABNORMAL HIGH (ref 6–22)
BUN: 26 mg/dL — ABNORMAL HIGH (ref 7–25)
CALCIUM: 9.6 mg/dL (ref 8.6–10.3)
CO2: 24 mmol/L (ref 20–32)
CREATININE: 1.06 mg/dL (ref 0.70–1.18)
Chloride: 106 mmol/L (ref 98–110)
GFR, EST NON AFRICAN AMERICAN: 70 mL/min/{1.73_m2} (ref >=60)
GFR, Est African American: 81 mL/min/{1.73_m2} (ref >=60)
GLOBULIN: 2.8 g/dL (ref 1.9–3.7)
Glucose, Bld: 91 mg/dL (ref 65–99)
Potassium: 4.4 mmol/L (ref 3.5–5.3)
Sodium: 139 mmol/L (ref 135–146)
TOTAL PROTEIN: 6.9 g/dL (ref 6.1–8.1)
Total Bilirubin: 0.6 mg/dL (ref 0.2–1.2)

## 2018-02-14 LAB — CBC WITH DIFFERENTIAL/PLATELET
BASOS PCT: 0.6 %
Basophils Absolute: 30 cells/uL (ref 0–200)
EOS PCT: 3.8 %
Eosinophils Absolute: 190 cells/uL (ref 15–500)
HEMATOCRIT: 43.5 % (ref 38.5–50.0)
HEMOGLOBIN: 14.4 g/dL (ref 13.2–17.1)
LYMPHS ABS: 1745 {cells}/uL (ref 850–3900)
MCH: 28.1 pg (ref 27.0–33.0)
MCHC: 33.1 g/dL (ref 32.0–36.0)
MCV: 84.8 fL (ref 80.0–100.0)
MPV: 9.4 fL (ref 7.5–12.5)
Monocytes Relative: 8.5 %
NEUTROS ABS: 2610 {cells}/uL (ref 1500–7800)
Neutrophils Relative %: 52.2 %
Platelets: 185 10*3/uL (ref 140–400)
RBC: 5.13 10*6/uL (ref 4.20–5.80)
RDW: 14.2 % (ref 11.0–15.0)
Total Lymphocyte: 34.9 %
WBC: 5 10*3/uL (ref 3.8–10.8)
WBCMIX: 425 {cells}/uL (ref 200–950)

## 2018-02-14 LAB — HEMOGLOBIN A1C
Hgb A1c MFr Bld: 5.8 % of total Hgb — ABNORMAL HIGH (ref <5.7)
Mean Plasma Glucose: 120 (calc)
eAG (mmol/L): 6.6 (calc)

## 2018-02-14 LAB — TSH: TSH: 0.21 mIU/L — ABNORMAL LOW (ref 0.40–4.50)

## 2018-02-14 LAB — VITAMIN D 25 HYDROXY (VIT D DEFICIENCY, FRACTURES): Vit D, 25-Hydroxy: 36 ng/mL (ref 30–100)

## 2018-04-23 ENCOUNTER — Encounter: Payer: Self-pay | Admitting: Family Medicine

## 2018-04-23 ENCOUNTER — Ambulatory Visit (INDEPENDENT_AMBULATORY_CARE_PROVIDER_SITE_OTHER): Payer: PPO | Admitting: Family Medicine

## 2018-04-23 VITALS — BP 120/80 | HR 87 | Temp 97.9°F | Resp 16 | Ht 66.0 in | Wt 204.4 lb

## 2018-04-23 DIAGNOSIS — J040 Acute laryngitis: Secondary | ICD-10-CM | POA: Diagnosis not present

## 2018-04-23 DIAGNOSIS — J069 Acute upper respiratory infection, unspecified: Secondary | ICD-10-CM | POA: Diagnosis not present

## 2018-04-23 DIAGNOSIS — K219 Gastro-esophageal reflux disease without esophagitis: Secondary | ICD-10-CM

## 2018-04-23 MED ORDER — PREDNISONE 20 MG PO TABS: 20.0000 mg | ORAL_TABLET | Freq: Two times a day (BID) | ORAL | 0 refills | Status: AC

## 2018-04-23 MED ORDER — GUAIFENESIN ER 600 MG PO TB12: 600.0000 mg | ORAL_TABLET | Freq: Two times a day (BID) | ORAL | 0 refills | Status: DC

## 2018-04-23 MED ORDER — FAMOTIDINE 40 MG PO TABS: 40.0000 mg | ORAL_TABLET | Freq: Two times a day (BID) | ORAL | 0 refills | Status: DC

## 2018-04-23 NOTE — Patient Instructions (Signed)
Cool Mist Vaporizer A cool mist vaporizer is a device that releases a cool mist into the air. If you have a cough or a cold, using a vaporizer may help relieve your symptoms. The mist adds moisture to the air, which may help thin your mucus and make it less sticky. When your mucus is thin and less sticky, it easier for you to breathe and to cough up secretions. Do not use a vaporizer if you are allergic to mold. Follow these instructions at home:  Follow the instructions that come with the vaporizer.  Do not use anything other than distilled water in the vaporizer.  Do not run the vaporizer all of the time. Doing that can cause mold or bacteria to grow in the vaporizer.  Clean the vaporizer after each time that you use it.  Clean and dry the vaporizer well before storing it.  Stop using the vaporizer if your breathing symptoms get worse. This information is not intended to replace advice given to you by your health care provider. Make sure you discuss any questions you have with your health care provider. Document Released: 02/10/2004 Document Revised: 12/03/2015 Document Reviewed: 08/14/2015 Elsevier Interactive Patient Education  2018 [employer] Inc[employer]   Upper Respiratory Infection, Adult Most upper respiratory infections (URIs) are caused by a virus. A URI affects the nose, throat, and upper air passages. The most common type of URI is often called "the common cold." Follow these instructions at home:  Take medicines only as told by your doctor.  Gargle warm saltwater or take cough drops to comfort your throat as told by your doctor.  Use a warm mist humidifier or inhale steam from a shower to increase air moisture. This may make it easier to breathe.  Drink enough fluid to keep your pee (urine) clear or pale yellow.  Eat soups and other clear broths.  Have a healthy diet.  Rest as needed.  Go back to work when your fever is gone or your doctor says it is okay. ? You may need  to stay home longer to avoid giving your URI to others. ? You can also wear a face mask and wash your hands often to prevent spread of the virus.  Use your inhaler more if you have asthma.  Do not use any tobacco products, including cigarettes, chewing tobacco, or electronic cigarettes. If you need help quitting, ask your doctor. Contact a doctor if:  You are getting worse, not better.  Your symptoms are not helped by medicine.  You have chills.  You are getting more short of breath.  You have brown or red mucus.  You have yellow or brown discharge from your nose.  You have pain in your face, especially when you bend forward.  You have a fever.  You have puffy (swollen) neck glands.  You have pain while swallowing.  You have white areas in the back of your throat. Get help right away if:  You have very bad or constant: ? Headache. ? Ear pain. ? Pain in your forehead, behind your eyes, and over your cheekbones (sinus pain). ? Chest pain.  You have long-lasting (chronic) lung disease and any of the following: ? Wheezing. ? Long-lasting cough. ? Coughing up blood. ? A change in your usual mucus.  You have a stiff neck.  You have changes in your: ? Vision. ? Hearing. ? Thinking. ? Mood. This information is not intended to replace advice given to you by your health care provider. Make   sure you discuss any questions you have with your health care provider. Document Released: 11/01/2007 Document Revised: 01/16/2016 Document Reviewed: 08/20/2013 Elsevier Interactive Patient Education  2018 Elsevier Inc.   

## 2018-04-23 NOTE — Progress Notes (Signed)
Name: Dustin Cobb   MRN: [id number]    DOB: Feb 07, 1946   Date:04/23/2018       Progress Note  Subjective  Chief Complaint  Chief Complaint  Patient presents with  . URI    cough, congested for 1 week  . Gastroesophageal Reflux    Pepcid not strong enough    HPI  GERD: under poor control with medication - due to Zantac recall, was put on 20mg  pepcid BID and this has not been working very well.  Still not following a diet,he continues todrink sodas and tea daily, but only buying the 8 ounces cans. Explained risk of PPI and H2 blocker use.  We will increase Pepcid dosing today to 40mg  BID. Endorses esophageal pain/heartburn, no regurgitation.   URI: Started about 7 days ago - symptoms include sore throat, cough - both of which have improved, but he still feels like he has a lot of congestion in his throat, still has hoarse voice. Denies chest pain, shortness of breath, fevers, body aches, nasal congestion, ear pain/pressure.  Has OSA, no asthma or COPD; he is a former smoker (~20 years ago).  Has been using flonase, but not daily; taking Claritin daily.  Patient Active Problem List   Diagnosis Date Noted  . Lichen simplex chronicus 02/13/2018  . Rash of hands 08/10/2016  . Hypertension, essential, benign 01/07/2015  . GERD (gastroesophageal reflux disease) 01/07/2015  . OSA on CPAP 01/07/2015  . Low TSH level 01/07/2015  . Hyperglycemia 01/07/2015  . Obesity (BMI 30.0-34.9) 01/07/2015  . Dyslipidemia 01/07/2015  . History of hemorrhoidectomy 01/07/2015  . History of iron deficiency anemia 01/07/2015  . Allergic rhinitis 01/07/2015  . Metabolic syndrome 83/41/9622    Social History   Tobacco Use  . Smoking status: Former Smoker    Packs/day: 1.00    Years: 20.00    Pack years: 20.00    Types: Cigarettes    Start date: 01/07/1975    Last attempt to quit: 01/07/1995    Years since quitting: 23.3  . Smokeless tobacco: Never Used  . Tobacco comment: smoking  cessation materials not required  Substance Use Topics  . Alcohol use: No    Alcohol/week: 0.0 standard drinks     Current Outpatient Medications:  .  Cholecalciferol (VITAMIN D3) 1000 UNITS CAPS, Take 1 capsule by mouth daily., Disp: , Rfl:  .  famotidine (PEPCID) 20 MG tablet, Take 1 tablet (20 mg total) by mouth 2 (two) times daily., Disp: 180 tablet, Rfl: 1 .  ferrous sulfate 325 (65 FE) MG tablet, Take 1 tablet by mouth daily., Disp: , Rfl:  .  fluticasone (FLONASE) 50 MCG/ACT nasal spray, Place 2 sprays into both nostrils daily., Disp: 16 g, Rfl: 5 .  loratadine (CLARITIN) 10 MG tablet, Take 1 tablet (10 mg total) by mouth daily., Disp: 30 tablet, Rfl: 5 .  pravastatin (PRAVACHOL) 40 MG tablet, Take 1 tablet (40 mg total) by mouth daily., Disp: 90 tablet, Rfl: 1 .  triamcinolone cream (KENALOG) 0.1 %, Apply 1 application topically 2 (two) times daily., Disp: 80 g, Rfl: 0 .  omeprazole (PRILOSEC) 40 MG capsule, Take 1 capsule (40 mg total) by mouth daily. (Patient not taking: Reported on 04/23/2018), Disp: 90 capsule, Rfl: 1  No Known Allergies  I personally reviewed active problem list, medication list, allergies, notes from last encounter, lab results with the patient/caregiver today.  ROS  Ten systems reviewed and is negative except as mentioned in HPI.  Objective  Vitals:   04/23/18 0859  BP: 120/80  Pulse: 87  Resp: 16  Temp: 97.9 F (36.6 C)  TempSrc: Oral  SpO2: 99%  Weight: 204 lb 6.4 oz (92.7 kg)  Height: 5\' 6"  (1.676 m)   Body mass index is 32.99 kg/m.  Nursing Note and Vital Signs reviewed.  Physical Exam  Constitutional: Patient appears well-developed and well-nourished. Obese.  No distress.  HEENT: head atraumatic, normocephalic, pupils equal and reactive to light, Bilateral TM's without erythema or effusion,  bilateral maxillary and frontal sinuses are non-tender, neck supple without lymphadenopathy, OP erythematous without exudate, voice is hoarse  during examination Cardiovascular: Normal rate, regular rhythm and normal heart sounds.  No murmur heard. No BLE edema. Pulmonary/Chest: Effort normal and breath sounds clear bilaterally. No respiratory distress. Psychiatric: Patient has a normal mood and affect. behavior is normal. Judgment and thought content normal.  No results found for this or any previous visit (from the past 72 hour(s)).  Assessment & Plan  1. Gastroesophageal reflux disease without esophagitis - We will trial higher dose pepcid, discussed avoiding triggering foods, especially tea and sodas. - famotidine (PEPCID) 40 MG tablet; Take 1 tablet (40 mg total) by mouth 2 (two) times daily.  Dispense: 60 tablet; Refill: 0  2. Upper respiratory tract infection, unspecified type - guaiFENesin (MUCINEX) 600 MG 12 hr tablet; Take 1 tablet (600 mg total) by mouth 2 (two) times daily.  Dispense: 20 tablet; Refill: 0 - predniSONE (DELTASONE) 20 MG tablet; Take 1 tablet (20 mg total) by mouth 2 (two) times daily with a meal for 5 days. With breakfast and lunch  Dispense: 10 tablet; Refill: 0  3. Laryngitis - predniSONE (DELTASONE) 20 MG tablet; Take 1 tablet (20 mg total) by mouth 2 (two) times daily with a meal for 5 days. With breakfast and lunch  Dispense: 10 tablet; Refill: 0  -Red flags and when to present for emergency care or RTC including fever >101.34F, chest pain, shortness of breath, new/worsening/un-resolving symptoms, reviewed with patient at time of visit. Follow up and care instructions discussed and provided in AVS.  Return if symptoms worsen or fail to improve, for 3-5 days.

## 2018-06-12 ENCOUNTER — Other Ambulatory Visit: Payer: Self-pay | Admitting: Family Medicine

## 2018-06-12 DIAGNOSIS — K219 Gastro-esophageal reflux disease without esophagitis: Secondary | ICD-10-CM

## 2018-08-13 ENCOUNTER — Other Ambulatory Visit: Payer: Self-pay

## 2018-08-13 ENCOUNTER — Ambulatory Visit (INDEPENDENT_AMBULATORY_CARE_PROVIDER_SITE_OTHER): Payer: PPO

## 2018-08-13 VITALS — BP 132/92 | HR 69 | Resp 16 | Ht 66.0 in | Wt 206.8 lb

## 2018-08-13 DIAGNOSIS — Z0000 Encounter for general adult medical examination without abnormal findings: Secondary | ICD-10-CM | POA: Diagnosis not present

## 2018-08-13 NOTE — Progress Notes (Signed)
Subjective:   Dustin Cobb is a 73 y.o. male who presents for Medicare Annual/Subsequent preventive examination.  Review of Systems:   Cardiac Risk Factors include: dyslipidemia;advanced age (>79men, >31 women);male gender;obesity (BMI >30kg/m2)     Objective:    Vitals: BP (!) 132/92 (BP Location: Left Arm, Patient Position: Sitting, Cuff Size: Normal)   Pulse 69   Resp 16   Ht 5\' 6"  (1.676 m)   Wt 206 lb 12.8 oz (93.8 kg)   SpO2 98%   BMI 33.38 kg/m   Body mass index is 33.38 kg/m.  Advanced Directives 08/13/2018 08/07/2017 02/08/2017 08/10/2016 01/19/2016 07/22/2015 01/07/2015  Does Patient Have a Medical Advance Directive? No No No No No No No  Would patient like information on creating a medical advance directive? Yes (MAU/Ambulatory/Procedural Areas - Information given) Yes (MAU/Ambulatory/Procedural Areas - Information given) - - No - patient declined information - No - patient declined information    Tobacco Social History   Tobacco Use  Smoking Status Former Smoker  . Packs/day: 1.00  . Years: 20.00  . Pack years: 20.00  . Types: Cigarettes  . Start date: 01/07/1975  . Last attempt to quit: 01/07/1995  . Years since quitting: 23.6  Smokeless Tobacco Never Used  Tobacco Comment   smoking cessation materials not required     Counseling given: Not Answered Comment: smoking cessation materials not required   Clinical Intake:  Pre-visit preparation completed: Yes  Pain : No/denies pain     BMI - recorded: 33.38 Nutritional Status: BMI > 30  Obese Nutritional Risks: None Diabetes: No  How often do you need to have someone help you when you read instructions, pamphlets, or other written materials from your doctor or pharmacy?: 1 - Never What is the last grade level you completed in school?: associate degree  Interpreter Needed?: No  Information entered by :: Clemetine Marker LPN  Past Medical History:  Diagnosis Date  . Aching headache   . Allergy   .  ED (erectile dysfunction)   . GERD (gastroesophageal reflux disease)   . Hyperlipidemia   . Hypertension   . Insomnia   . Metabolic syndrome   . Onychomycosis   . OSA on CPAP   . Postural dizziness   . Rash    Past Surgical History:  Procedure Laterality Date  . HEMORRHOID BANDING  04/01/12   Dr. Fleet Contras   Family History  Problem Relation Age of Onset  . Cancer Mother        Thyroid  . Emphysema Father   . COPD Father   . Hypertension Sister   . Hypertension Brother   . Heart disease Brother   . Healthy Daughter    Social History   Socioeconomic History  . Marital status: Married    Spouse name: Mary  . Number of children: 2  . Years of education: Not on file  . Highest education level: Associate degree: occupational, Hotel manager, or vocational program  Occupational History  . Occupation: retired  Scientific laboratory technician  . Financial resource strain: Not hard at all  . Food insecurity:    Worry: Never true    Inability: Never true  . Transportation needs:    Medical: No    Non-medical: No  Tobacco Use  . Smoking status: Former Smoker    Packs/day: 1.00    Years: 20.00    Pack years: 20.00    Types: Cigarettes    Start date: 01/07/1975    Last attempt to  quit: 01/07/1995    Years since quitting: 23.6  . Smokeless tobacco: Never Used  . Tobacco comment: smoking cessation materials not required  Substance and Sexual Activity  . Alcohol use: No    Alcohol/week: 0.0 standard drinks  . Drug use: No  . Sexual activity: Yes    Partners: Female  Lifestyle  . Physical activity:    Days per week: 0 days    Minutes per session: 0 min  . Stress: Only a little  Relationships  . Social connections:    Talks on phone: More than three times a week    Gets together: More than three times a week    Attends religious service: More than 4 times per year    Active member of club or organization: No    Attends meetings of clubs or organizations: Never    Relationship status:  Married  Other Topics Concern  . Not on file  Social History Narrative  . Not on file    Outpatient Encounter Medications as of 08/13/2018  Medication Sig  . Cholecalciferol (VITAMIN D3) 1000 UNITS CAPS Take 1 capsule by mouth daily.  . famotidine (PEPCID) 40 MG tablet Take 1 tablet (40 mg total) by mouth 2 (two) times daily.  . ferrous sulfate 325 (65 FE) MG tablet Take 1 tablet by mouth daily.  . fluticasone (FLONASE) 50 MCG/ACT nasal spray Place 2 sprays into both nostrils daily.  Marland Kitchen loratadine (CLARITIN) 10 MG tablet Take 1 tablet (10 mg total) by mouth daily.  Marland Kitchen omeprazole (PRILOSEC) 40 MG capsule Take 40 mg by mouth daily.   . pravastatin (PRAVACHOL) 40 MG tablet Take 1 tablet (40 mg total) by mouth daily.  Marland Kitchen triamcinolone cream (KENALOG) 0.1 % Apply 1 application topically 2 (two) times daily.  . [DISCONTINUED] famotidine (PEPCID) 20 MG tablet   . [DISCONTINUED] guaiFENesin (MUCINEX) 600 MG 12 hr tablet Take 1 tablet (600 mg total) by mouth 2 (two) times daily.   No facility-administered encounter medications on file as of 08/13/2018.     Activities of Daily Living In your present state of health, do you have any difficulty performing the following activities: 08/13/2018 02/13/2018  Hearing? N N  Comment declines hearing aids -  Vision? N Y  Comment reading glasses reading glasses  Difficulty concentrating or making decisions? N N  Walking or climbing stairs? N N  Dressing or bathing? N N  Doing errands, shopping? N N  Preparing Food and eating ? N -  Using the Toilet? N -  In the past six months, have you accidently leaked urine? N -  Do you have problems with loss of bowel control? N -  Managing your Medications? N -  Managing your Finances? N -  Housekeeping or managing your Housekeeping? N -  Some recent data might be hidden    Patient Care Team: [name], MD as PCP - General (Family Medicine)   Assessment:   This is a routine wellness examination for  Dustin Cobb.  Exercise Activities and Dietary recommendations Current Exercise Habits: The patient does not participate in regular exercise at present, Exercise limited by: None identified  Goals    . DIET - INCREASE WATER INTAKE     Recommend to drink at least 6-8 8oz glasses of water per day.       Fall Risk Fall Risk  08/13/2018 04/23/2018 02/13/2018 08/13/2017 08/07/2017  Falls in the past year? 0 0 No No No  Number falls in past yr: 0  0 - - -  Injury with Fall? 0 0 - - -  Follow up Falls prevention discussed - - - -   FALL RISK PREVENTION PERTAINING TO THE HOME:  Any stairs in or around the home? Yes If so, do they handrails? Yes  Home free of loose throw rugs in walkways, pet beds, electrical cords, etc? Yes  Adequate lighting in your home to reduce risk of falls? Yes   ASSISTIVE DEVICES UTILIZED TO PREVENT FALLS:  Life alert? No  Use of a cane, walker or w/c? No  Grab bars in the bathroom? No  Shower chair or bench in shower? No  Elevated toilet seat or a handicapped toilet? No   DME ORDERS:  DME order needed?  No   TIMED UP AND GO:  Was the test performed? Yes .  Length of time to ambulate 10 feet: 5 sec.   GAIT:  Appearance of gait: Gait stead-fast and without the use of an assistive device.   Education: Fall risk prevention has been discussed.  Intervention(s) required? No   Depression Screen PHQ 2/9 Scores 08/13/2018 04/23/2018 02/13/2018 08/13/2017  PHQ - 2 Score 2 0 0 0  PHQ- 9 Score 4 0 - -    Cognitive Function     6CIT Screen 08/13/2018 08/07/2017  What Year? 0 points 0 points  What month? 0 points 0 points  What time? 0 points 0 points  Count back from 20 0 points 0 points  Months in reverse 0 points 0 points  Repeat phrase 2 points 4 points  Total Score 2 4    Immunization History  Administered Date(s) Administered  . Influenza, High Dose Seasonal PF 04/11/2016, 03/05/2017, 02/13/2018  . Influenza,inj,Quad PF,6+ Mos 01/07/2015  .  Influenza-Unspecified 03/02/2014  . Pneumococcal Conjugate-13 01/07/2015  . Pneumococcal Polysaccharide-23 12/18/2013  . Tdap 01/29/2009  . Zoster 07/22/2015    Qualifies for Shingles Vaccine? Yes  Zostavax completed 2017. Due for Shingrix. Education has been provided regarding the importance of this vaccine. Pt has been advised to call insurance company to determine out of pocket expense. Advised may also receive vaccine at local pharmacy or Health Dept. Verbalized acceptance and understanding.  Tdap: Up to date  Flu Vaccine: Up to date  Pneumococcal Vaccine: Up to date   Screening Tests Health Maintenance  Topic Date Due  . TETANUS/TDAP  01/30/2019  . COLONOSCOPY  12/27/2020  . INFLUENZA VACCINE  Completed  . Hepatitis C Screening  Completed  . PNA vac Low Risk Adult  Completed   Cancer Screenings:  Colorectal Screening: Completed 12/28/10. Repeat every 10 years  Bone Density Screening: Pt provided with contact phone # to call and schedule dexa screening due to hyperthyroidism.   Lung Cancer Screening: (Low Dose CT Chest recommended if Age 1-80 years, 30 pack-year currently smoking OR have quit w/in 15years.) does not qualify.   Additional Screening:  Hepatitis C Screening: does qualify; Completed 03/02/14  Vision Screening: Recommended annual ophthalmology exams for early detection of glaucoma and other disorders of the eye. Is the patient up to date with their annual eye exam?  Yes  Who is the provider or what is the name of the office in which the pt attends annual eye exams? Dr. Ellin Mayhew  Dental Screening: Recommended annual dental exams for proper oral hygiene  Community Resource Referral:  CRR required this visit?  No       Plan:    I have personally reviewed and addressed the Medicare Annual Wellness  questionnaire and have noted the following in the patient's chart:  A. Medical and social history B. Use of alcohol, tobacco or illicit drugs  C. Current  medications and supplements D. Functional ability and status E.  Nutritional status F.  Physical activity G. Advance directives H. List of other physicians I.  Hospitalizations, surgeries, and ER visits in previous 12 months J.  [place] such as hearing and vision if needed, cognitive and depression L. Referrals and appointments   In addition, I have reviewed and discussed with patient certain preventive protocols, quality metrics, and best practice recommendations. A written personalized care plan for preventive services as well as general preventive health recommendations were provided to patient.   Signed,  Clemetine Marker, LPN Nurse Health Advisor   Nurse Notes: pt c/o ear wax build up in left ear. He also states the he does not feel like the pepcid is working as well for him as the zantac did previously because he is experiencing indigestion that causes what feels like a "muscle twitch" in left chest. BP slightly elevated today but follow up appt scheduled for physical with Dr. Ancil Boozer on Friday.

## 2018-08-13 NOTE — Patient Instructions (Addendum)
Dustin Cobb , Thank you for taking time to come for your Medicare Wellness Visit. I appreciate your ongoing commitment to your health goals. Please review the following plan we discussed and let me know if I can assist you in the future.   Screening recommendations/referrals: Colonoscopy: done 12/28/10. Repeat in 2022. Recommended yearly ophthalmology/optometry visit for glaucoma screening and checkup Recommended yearly dental visit for hygiene and checkup Please call [phone] to schedule your bone density exam.   Vaccinations: Influenza vaccine: done 02/13/18 Pneumococcal vaccine: done 01/07/15 Tdap vaccine: done 01/29/2009 Shingles vaccine: Shingrix discussed. Please contact your pharmacy for coverage information.     Advanced directives: Advance directive discussed with you today. I have provided a copy for you to complete at home and have notarized. Once this is complete please bring a copy in to our office so we can scan it into your chart.  Conditions/risks identified: Recommend drinking 6-8 glasses of water per day  Next appointment: 08/16/18 9:00 Dr. Ancil Boozer  Preventive Care 72 Years and Older, Male Preventive care refers to lifestyle choices and visits with your health care provider that can promote health and wellness. What does preventive care include?  A yearly physical exam. This is also called an annual well check.  Dental exams once or twice a year.  Routine eye exams. Ask your health care provider how often you should have your eyes checked.  Personal lifestyle choices, including:  Daily care of your teeth and gums.  Regular physical activity.  Eating a healthy diet.  Avoiding tobacco and drug use.  Limiting alcohol use.  Practicing safe sex.  Taking low doses of aspirin every day.  Taking vitamin and mineral supplements as recommended by your health care provider. What happens during an annual well check? The services and screenings done by your health  care provider during your annual well check will depend on your age, overall health, lifestyle risk factors, and family history of disease. Counseling  Your health care provider may ask you questions about your:  Alcohol use.  Tobacco use.  Drug use.  Emotional well-being.  Home and relationship well-being.  Sexual activity.  Eating habits.  History of falls.  Memory and ability to understand (cognition).  Work and work Statistician. Screening  You may have the following tests or measurements:  Height, weight, and BMI.  Blood pressure.  Lipid and cholesterol levels. These may be checked every 5 years, or more frequently if you are over 31 years old.  Skin check.  Lung cancer screening. You may have this screening every year starting at age 14 if you have a 30-pack-year history of smoking and currently smoke or have quit within the past 15 years.  Fecal occult blood test (FOBT) of the stool. You may have this test every year starting at age 76.  Flexible sigmoidoscopy or colonoscopy. You may have a sigmoidoscopy every 5 years or a colonoscopy every 10 years starting at age 60.  Prostate cancer screening. Recommendations will vary depending on your family history and other risks.  Hepatitis C blood test.  Hepatitis B blood test.  Sexually transmitted disease (STD) testing.  Diabetes screening. This is done by checking your blood sugar (glucose) after you have not eaten for a while (fasting). You may have this done every 1-3 years.  Abdominal aortic aneurysm (AAA) screening. You may need this if you are a current or former smoker.  Osteoporosis. You may be screened starting at age 79 if you are at high risk. Talk  with your health care provider about your test results, treatment options, and if necessary, the need for more tests. Vaccines  Your health care provider may recommend certain vaccines, such as:  Influenza vaccine. This is recommended every year.   Tetanus, diphtheria, and acellular pertussis (Tdap, Td) vaccine. You may need a Td booster every 10 years.  Zoster vaccine. You may need this after age 71.  Pneumococcal 13-valent conjugate (PCV13) vaccine. One dose is recommended after age 60.  Pneumococcal polysaccharide (PPSV23) vaccine. One dose is recommended after age 76. Talk to your health care provider about which screenings and vaccines you need and how often you need them. This information is not intended to replace advice given to you by your health care provider. Make sure you discuss any questions you have with your health care provider. Document Released: 06/11/2015 Document Revised: 02/02/2016 Document Reviewed: 03/16/2015 Elsevier Interactive Patient Education  2017 [place] Prevention in the Home Falls can cause injuries. They can happen to people of all ages. There are many things you can do to make your home safe and to help prevent falls. What can I do on the outside of my home?  Regularly fix the edges of walkways and driveways and fix any cracks.  Remove anything that might make you trip as you walk through a door, such as a raised step or threshold.  Trim any bushes or trees on the path to your home.  Use bright outdoor lighting.  Clear any walking paths of anything that might make someone trip, such as rocks or tools.  Regularly check to see if handrails are loose or broken. Make sure that both sides of any steps have handrails.  Any raised decks and porches should have guardrails on the edges.  Have any leaves, snow, or ice cleared regularly.  Use sand or salt on walking paths during winter.  Clean up any spills in your garage right away. This includes oil or grease spills. What can I do in the bathroom?  Use night lights.  Install grab bars by the toilet and in the tub and shower. Do not use towel bars as grab bars.  Use non-skid mats or decals in the tub or shower.  If you need to sit  down in the shower, use a plastic, non-slip stool.  Keep the floor dry. Clean up any water that spills on the floor as soon as it happens.  Remove soap buildup in the tub or shower regularly.  Attach bath mats securely with double-sided non-slip rug tape.  Do not have throw rugs and other things on the floor that can make you trip. What can I do in the bedroom?  Use night lights.  Make sure that you have a light by your bed that is easy to reach.  Do not use any sheets or blankets that are too big for your bed. They should not hang down onto the floor.  Have a firm chair that has side arms. You can use this for support while you get dressed.  Do not have throw rugs and other things on the floor that can make you trip. What can I do in the kitchen?  Clean up any spills right away.  Avoid walking on wet floors.  Keep items that you use a lot in easy-to-reach places.  If you need to reach something above you, use a strong step stool that has a grab bar.  Keep electrical cords out of the way.  Do  not use floor polish or wax that makes floors slippery. If you must use wax, use non-skid floor wax.  Do not have throw rugs and other things on the floor that can make you trip. What can I do with my stairs?  Do not leave any items on the stairs.  Make sure that there are handrails on both sides of the stairs and use them. Fix handrails that are broken or loose. Make sure that handrails are as long as the stairways.  Check any carpeting to make sure that it is firmly attached to the stairs. Fix any carpet that is loose or worn.  Avoid having throw rugs at the top or bottom of the stairs. If you do have throw rugs, attach them to the floor with carpet tape.  Make sure that you have a light switch at the top of the stairs and the bottom of the stairs. If you do not have them, ask someone to add them for you. What else can I do to help prevent falls?  Wear shoes that:  Do not have  high heels.  Have rubber bottoms.  Are comfortable and fit you well.  Are closed at the toe. Do not wear sandals.  If you use a stepladder:  Make sure that it is fully opened. Do not climb a closed stepladder.  Make sure that both sides of the stepladder are locked into place.  Ask someone to hold it for you, if possible.  Clearly mark and make sure that you can see:  Any grab bars or handrails.  First and last steps.  Where the edge of each step is.  Use tools that help you move around (mobility aids) if they are needed. These include:  Canes.  Walkers.  Scooters.  Crutches.  Turn on the lights when you go into a dark area. Replace any light bulbs as soon as they burn out.  Set up your furniture so you have a clear path. Avoid moving your furniture around.  If any of your floors are uneven, fix them.  If there are any pets around you, be aware of where they are.  Review your medicines with your doctor. Some medicines can make you feel dizzy. This can increase your chance of falling. Ask your doctor what other things that you can do to help prevent falls. This information is not intended to replace advice given to you by your health care provider. Make sure you discuss any questions you have with your health care provider. Document Released: 03/11/2009 Document Revised: 10/21/2015 Document Reviewed: 06/19/2014 Elsevier Interactive Patient Education  2017 [employer].

## 2018-08-16 ENCOUNTER — Encounter: Payer: Self-pay | Admitting: Family Medicine

## 2018-08-16 ENCOUNTER — Ambulatory Visit (INDEPENDENT_AMBULATORY_CARE_PROVIDER_SITE_OTHER): Payer: PPO | Admitting: Family Medicine

## 2018-08-16 ENCOUNTER — Other Ambulatory Visit: Payer: Self-pay

## 2018-08-16 VITALS — BP 114/86 | HR 70 | Temp 97.5°F | Resp 16 | Ht 66.5 in | Wt 205.6 lb

## 2018-08-16 DIAGNOSIS — J3089 Other allergic rhinitis: Secondary | ICD-10-CM

## 2018-08-16 DIAGNOSIS — E669 Obesity, unspecified: Secondary | ICD-10-CM

## 2018-08-16 DIAGNOSIS — Z9989 Dependence on other enabling machines and devices: Secondary | ICD-10-CM | POA: Diagnosis not present

## 2018-08-16 DIAGNOSIS — Z87891 Personal history of nicotine dependence: Secondary | ICD-10-CM | POA: Diagnosis not present

## 2018-08-16 DIAGNOSIS — R0789 Other chest pain: Secondary | ICD-10-CM

## 2018-08-16 DIAGNOSIS — R7989 Other specified abnormal findings of blood chemistry: Secondary | ICD-10-CM

## 2018-08-16 DIAGNOSIS — E8881 Metabolic syndrome: Secondary | ICD-10-CM | POA: Diagnosis not present

## 2018-08-16 DIAGNOSIS — E785 Hyperlipidemia, unspecified: Secondary | ICD-10-CM

## 2018-08-16 DIAGNOSIS — J302 Other seasonal allergic rhinitis: Secondary | ICD-10-CM

## 2018-08-16 DIAGNOSIS — G4733 Obstructive sleep apnea (adult) (pediatric): Secondary | ICD-10-CM | POA: Diagnosis not present

## 2018-08-16 DIAGNOSIS — Z0000 Encounter for general adult medical examination without abnormal findings: Secondary | ICD-10-CM

## 2018-08-16 LAB — TSH: TSH: 0.29 mIU/L — ABNORMAL LOW (ref 0.40–4.50)

## 2018-08-16 NOTE — Progress Notes (Signed)
Name: Dustin Cobb   MRN: [id number]    DOB: 1946-04-01   Date:08/16/2018       Progress Note  Subjective  Chief Complaint  Chief Complaint  Patient presents with  . Annual Exam  . Medication Refill  . Hypertension    Well controlled with no medication  . Sleep Apnea    CPAP causes sinuses to drain goes a couple of days on machine and then takes a break to help control sinus discomfort  . Obesity  . Allergic Rhinitis   . Gastroesophageal Reflux  . Low TSH    Dr. Gabriel Carina turned patient back to Dr. Ancil Boozer  . Hyperlipidemia  . Spasms    In his left upper chest    HPI  Patient presents for annual CPE and follow up  Left side sharp pain: happens about once a day, a less than second sharp pain on left upper chest, not associated with nausea, sob. Discussed symptoms of heart related chest pain. He is not very concerned about it.   Hyperlipidemia: taking Pravastatin, no side effects, no chest pain or myalgias,reviewed labs done in 01/2018 and LDL was 99  HTN:he stopped taking medication n 2017 because bp has been at goal, since retirement.No  SOB or decrease in exercise tolerance . BP is well controlled.Calculated his ASCVD risk is 10.8% and risk of bleeding 7.8% on his last visit , and it is not indicated for him to continue aspirin daily, continue statin therapy   Hyperglycemia: hgbA1C 6 months ago and it was stable at 5.8 % No polyphagia, polydipsia or polyuria. He has been eating healthier, more vegetables, he will try to increase water intake   Low TSH: seen by Dr. Gabriel Carina, [place] to normal  She advised yearly follow up labs here in our office, it was slightly lower in September and  we will recheckagain today . He denies palpitation, no significant weight loss, no diarrhea.  GERD: under control with medication, still not following a diet,he continues todrink sodas and tea daily, but only buying the 8 ounces cans. Unchanged   OSA: wearing CPAPabout 3-4  nights a week for about6 hours. He wakes up feeling restedmost days.He states he skips days because the pressure seems to bother his sinuses, he states he got a new mask and seems to be working a little better.Unchanged   Obesity: discussed trying to go down to below 200 lbs. He is more aware of portion sizes, he is eating more vegetables. Needs to drink more water and stop drinking sodas  AR: on nasal steroid and claritin, still has intermittent symptoms. Unchanged   USPSTF grade A and B recommendations:  Diet: he does not have a high calcium diet, he is taking otc vitamin D, he is eats lean meat, eating tree nuts Exercise: active around the house , no structure activity   Depression:  Depression screen [facility] 2/9 08/16/2018 08/13/2018 04/23/2018 02/13/2018 08/13/2017  Decreased Interest 0 1 0 0 0  Down, Depressed, Hopeless 0 1 0 0 0  PHQ - 2 Score 0 2 0 0 0  Altered sleeping 0 1 0 - -  Tired, decreased energy 0 1 0 - -  Change in appetite 0 0 0 - -  Feeling bad or failure about yourself  0 0 0 - -  Trouble concentrating 0 0 0 - -  Moving slowly or fidgety/restless 0 0 0 - -  Suicidal thoughts 0 0 0 - -  PHQ-9 Score 0 4 0 - -  Difficult doing work/chores Not difficult at all Not difficult at all Not difficult at all - -    Hypertension:  BP Readings from Last 3 Encounters:  08/16/18 114/86  08/13/18 (!) 132/92  04/23/18 120/80    Obesity: Wt Readings from Last 3 Encounters:  08/16/18 205 lb 9.6 oz (93.3 kg)  08/13/18 206 lb 12.8 oz (93.8 kg)  04/23/18 204 lb 6.4 oz (92.7 kg)   BMI Readings from Last 3 Encounters:  08/16/18 32.69 kg/m  08/13/18 33.38 kg/m  04/23/18 32.99 kg/m     Lipids:  Lab Results  Component Value Date   CHOL 175 02/13/2018   CHOL 162 02/08/2017   CHOL 162 01/19/2016   Lab Results  Component Value Date   HDL 62 02/13/2018   HDL 62 02/08/2017   HDL 68 01/19/2016   Lab Results  Component Value Date   LDLCALC 99 02/13/2018   LDLCALC 85  02/08/2017   LDLCALC 83 01/19/2016   Lab Results  Component Value Date   TRIG 56 02/13/2018   TRIG 68 02/08/2017   TRIG 57 01/19/2016   Lab Results  Component Value Date   CHOLHDL 2.8 02/13/2018   CHOLHDL 2.6 02/08/2017   CHOLHDL 2.4 01/19/2016   No results found for: LDLDIRECT Glucose:  Glucose  Date Value Ref Range Status  08/29/2012 97 65 - 99 mg/dL Final  07/28/2011 94 65 - 99 mg/dL Final   Glucose, Bld  Date Value Ref Range Status  02/13/2018 91 65 - 99 mg/dL Final    Comment:    .            Fasting reference interval .   08/13/2017 95 65 - 99 mg/dL Final    Comment:    .            Fasting reference interval .   02/08/2017 89 65 - 99 mg/dL Final    Comment:    .            Fasting reference interval .       [facility] Visit from 08/16/2018 in [facility]  AUDIT-C Score  0      Married STD testing and prevention (HIV/chl/gon/syphilis): N/A Hep C: up to date   Skin cancer: discussed atypical lesions  Colorectal cancer: up to date  Prostate cancer: discussed USPTF Lab Results  Component Value Date   PSA Normal  10/17/2013    IPSS Questionnaire (AUA-7): Over the past month.   1)  How often have you had a sensation of not emptying your bladder completely after you finish urinating?  0 - Not at all  2)  How often have you had to urinate again less than two hours after you finished urinating? 1 - Less than 1 time in 5  3)  How often have you found you stopped and started again several times when you urinated?  0 - Not at all  4) How difficult have you found it to postpone urination?  0 - Not at all  5) How often have you had a weak urinary stream?  1 - Less than 1 time in 5  6) How often have you had to push or strain to begin urination?  0 - Not at all  7) How many times did you most typically get up to urinate from the time you went to bed until the time you got up in the morning?  0 - None  Total score:  0-7 mildly  symptomatic    8-19 moderately symptomatic   20-35 severely symptomatic    Lung cancer:   Low Dose CT Chest recommended if Age 50-80 years, 30 pack-year currently smoking OR have quit w/in 15years. Patient does not qualify.   AAA: The USPSTF recommends one-time screening with ultrasonography in men ages 24 to 7 years who have ever smoked, we will schedule it for him  ECG:  Today   Advanced Care Planning: A voluntary discussion about advance care planning including the explanation and discussion of advance directives.  Discussed health care proxy and Living will, and the patient was able to identify a health care proxy as wife   Patient does not have a living will at present time.  Patient Active Problem List   Diagnosis Date Noted  . Lichen simplex chronicus 02/13/2018  . Rash of hands 08/10/2016  . Hypertension, essential, benign 01/07/2015  . GERD (gastroesophageal reflux disease) 01/07/2015  . OSA on CPAP 01/07/2015  . Low TSH level 01/07/2015  . Hyperglycemia 01/07/2015  . Obesity (BMI 30.0-34.9) 01/07/2015  . Dyslipidemia 01/07/2015  . History of hemorrhoidectomy 01/07/2015  . History of iron deficiency anemia 01/07/2015  . Allergic rhinitis 01/07/2015  . Metabolic syndrome 52/77/8242    Past Surgical History:  Procedure Laterality Date  . HEMORRHOID BANDING  04/01/12   Dr. Fleet Contras    Family History  Problem Relation Age of Onset  . Cancer Mother        Thyroid  . Emphysema Father   . COPD Father   . Hypertension Sister   . Hypertension Brother   . Heart disease Brother   . Healthy Daughter     Social History   Socioeconomic History  . Marital status: Married    Spouse name: Mary  . Number of children: 2  . Years of education: Not on file  . Highest education level: Associate degree: occupational, Hotel manager, or vocational program  Occupational History  . Occupation: retired  Scientific laboratory technician  . Financial resource strain: Not hard at all  . Food insecurity:    Worry: Never  true    Inability: Never true  . Transportation needs:    Medical: No    Non-medical: No  Tobacco Use  . Smoking status: Former Smoker    Packs/day: 1.00    Years: 20.00    Pack years: 20.00    Types: Cigarettes    Start date: 01/07/1975    Last attempt to quit: 01/07/1995    Years since quitting: 23.6  . Smokeless tobacco: Never Used  . Tobacco comment: smoking cessation materials not required  Substance and Sexual Activity  . Alcohol use: No    Alcohol/week: 0.0 standard drinks  . Drug use: No  . Sexual activity: Yes    Partners: Female  Lifestyle  . Physical activity:    Days per week: 0 days    Minutes per session: 0 min  . Stress: Only a little  Relationships  . Social connections:    Talks on phone: More than three times a week    Gets together: More than three times a week    Attends religious service: More than 4 times per year    Active member of club or organization: No    Attends meetings of clubs or organizations: Never    Relationship status: Married  . Intimate partner violence:    Fear of current or ex partner: No    Emotionally abused: No    Physically abused: No  Forced sexual activity: No  Other Topics Concern  . Not on file  Social History Narrative  . Not on file     Current Outpatient Medications:  .  Cholecalciferol (VITAMIN D3) 1000 UNITS CAPS, Take 1 capsule by mouth daily., Disp: , Rfl:  .  famotidine (PEPCID) 40 MG tablet, Take 1 tablet (40 mg total) by mouth 2 (two) times daily., Disp: 180 tablet, Rfl: 0 .  ferrous sulfate 325 (65 FE) MG tablet, Take 1 tablet by mouth daily., Disp: , Rfl:  .  fluticasone (FLONASE) 50 MCG/ACT nasal spray, Place 2 sprays into both nostrils daily., Disp: 16 g, Rfl: 5 .  loratadine (CLARITIN) 10 MG tablet, Take 1 tablet (10 mg total) by mouth daily., Disp: 30 tablet, Rfl: 5 .  omeprazole (PRILOSEC) 40 MG capsule, Take 40 mg by mouth daily. , Disp: , Rfl:  .  pravastatin (PRAVACHOL) 40 MG tablet, Take 1  tablet (40 mg total) by mouth daily., Disp: 90 tablet, Rfl: 1 .  triamcinolone cream (KENALOG) 0.1 %, Apply 1 application topically 2 (two) times daily., Disp: 80 g, Rfl: 0  No Known Allergies   ROS  Constitutional: Negative for fever or weight change.  Respiratory: Negative for cough and shortness of breath.   Cardiovascular: Negative for chest pain or palpitations.  Gastrointestinal: Negative for abdominal pain, no bowel changes.  Musculoskeletal: Negative for gait problem or joint swelling.  Skin: Negative for rash.  Neurological: Negative for dizziness or headache.  No other specific complaints in a complete review of systems (except as listed in HPI above).   Objective  Vitals:   08/16/18 0852  BP: 114/86  Pulse: 70  Resp: 16  Temp: (!) 97.5 F (36.4 C)  TempSrc: Oral  SpO2: 98%  Weight: 205 lb 9.6 oz (93.3 kg)  Height: 5' 6.5" (1.689 m)    Body mass index is 32.69 kg/m.  Physical Exam  Constitutional: Patient appears well-developed and well-nourished. No distress.  HENT: Head: Normocephalic and atraumatic. Ears: B TMs ok, no erythema or effusion; Nose: Nose normal. Mouth/Throat: Oropharynx is clear and moist. No oropharyngeal exudate.  Eyes: Conjunctivae and EOM are normal. Pupils are equal, round, and reactive to light. No scleral icterus.  Neck: Normal range of motion. Neck supple. No JVD present. No thyromegaly present.  Cardiovascular: Normal rate, regular rhythm and normal heart sounds.  No murmur heard. No BLE edema. Pulmonary/Chest: Effort normal and breath sounds normal. No respiratory distress. Abdominal: Soft. Bowel sounds are normal, no distension. There is no tenderness. no masses MALE GENITALIA: Normal descended testes bilaterally, no masses palpated, no hernias, no lesions, no discharge RECTAL: Prostate normal size and consistency, no rectal masses or hemorrhoids Musculoskeletal: Normal range of motion, no joint effusions. No gross  deformities Neurological: he is alert and oriented to person, place, and time. No cranial nerve deficit. Coordination, balance, strength, speech and gait are normal.  Skin: Skin is warm and dry. No rash noted. No erythema.  Psychiatric: Patient has a normal mood and affect. behavior is normal. Judgment and thought content normal.  PHQ2/9: Depression screen [facility] 2/9 08/16/2018 08/13/2018 04/23/2018 02/13/2018 08/13/2017  Decreased Interest 0 1 0 0 0  Down, Depressed, Hopeless 0 1 0 0 0  PHQ - 2 Score 0 2 0 0 0  Altered sleeping 0 1 0 - -  Tired, decreased energy 0 1 0 - -  Change in appetite 0 0 0 - -  Feeling bad or failure about yourself  0  0 0 - -  Trouble concentrating 0 0 0 - -  Moving slowly or fidgety/restless 0 0 0 - -  Suicidal thoughts 0 0 0 - -  PHQ-9 Score 0 4 0 - -  Difficult doing work/chores Not difficult at all Not difficult at all Not difficult at all - -     Fall Risk: Fall Risk  08/16/2018 08/13/2018 04/23/2018 02/13/2018 08/13/2017  Falls in the past year? 0 0 0 No No  Number falls in past yr: 0 0 0 - -  Injury with Fall? 0 0 0 - -  Follow up - Falls prevention discussed - - -    Functional Status Survey: Is the patient deaf or have difficulty hearing?: Yes(left ear- roaring in his ear) Does the patient have difficulty seeing, even when wearing glasses/contacts?: No Does the patient have difficulty concentrating, remembering, or making decisions?: No Does the patient have difficulty walking or climbing stairs?: No Does the patient have difficulty dressing or bathing?: No Does the patient have difficulty doing errands alone such as visiting a doctor's office or shopping?: No   Assessment & Plan  1. History of tobacco use  - [place] MEDICARE SCREENING; Future  2. Low TSH level  - TSH  3. Dyslipidemia  - EKG 12-Lead  4. OSA on CPAP  Needs to increase use of CPAP to every night   5. Perennial allergic rhinitis with seasonal variation  Stable   6.  Metabolic syndrome  Reviewed last labs   7. Obesity (BMI 30.0-34.9)  Discussed with the patient the risk posed by an increased BMI. Discussed importance of portion control, calorie counting and at least 150 minutes of physical activity weekly. Avoid sweet beverages and drink more water. Eat at least 6 servings of fruit and vegetables daily   8. Physical exam, annual   9. Atypical chest pain  - EKG 12-Lead  -Prostate cancer screening and PSA options (with potential risks and benefits of testing vs not testing) were discussed along with recent recs/guidelines. -USPSTF grade A and B recommendations reviewed with patient; age-appropriate recommendations, preventive care, screening tests, etc discussed and encouraged; healthy living encouraged; see AVS for patient education given to patient -Discussed importance of 150 minutes of physical activity weekly, eat two servings of fish weekly, eat one serving of tree nuts ( cashews, pistachios, pecans, almonds.Marland Kitchen) every other day, eat 6 servings of fruit/vegetables daily and drink plenty of water and avoid sweet beverages.

## 2018-08-16 NOTE — Patient Instructions (Signed)
Preventive Care 72 Years and Older, Male Preventive care refers to lifestyle choices and visits with your health care provider that can promote health and wellness. What does preventive care include?   A yearly physical exam. This is also called an annual well check.  Dental exams once or twice a year.  Routine eye exams. Ask your health care provider how often you should have your eyes checked.  Personal lifestyle choices, including: ? Daily care of your teeth and gums. ? Regular physical activity. ? Eating a healthy diet. ? Avoiding tobacco and drug use. ? Limiting alcohol use. ? Practicing safe sex. ? Taking low doses of aspirin every day. ? Taking vitamin and mineral supplements as recommended by your health care provider. What happens during an annual well check? The services and screenings done by your health care provider during your annual well check will depend on your age, overall health, lifestyle risk factors, and family history of disease. Counseling Your health care provider may ask you questions about your:  Alcohol use.  Tobacco use.  Drug use.  Emotional well-being.  Home and relationship well-being.  Sexual activity.  Eating habits.  History of falls.  Memory and ability to understand (cognition).  Work and work Statistician. Screening You may have the following tests or measurements:  Height, weight, and BMI.  Blood pressure.  Lipid and cholesterol levels. These may be checked every 5 years, or more frequently if you are over 25 years old.  Skin check.  Lung cancer screening. You may have this screening every year starting at age 72 if you have a 30-pack-year history of smoking and currently smoke or have quit within the past 15 years.  Colorectal cancer screening. All adults should have this screening starting at age 25 and continuing until age 24. You will have tests every 1-10 years, depending on your results and the type of screening  test. People at increased risk should start screening at an earlier age. Screening tests may include: ? Guaiac-based fecal occult blood testing. ? Fecal immunochemical test (FIT). ? Stool DNA test. ? Virtual colonoscopy. ? Sigmoidoscopy. During this test, a flexible tube with a tiny camera (sigmoidoscope) is used to examine your rectum and lower colon. The sigmoidoscope is inserted through your anus into your rectum and lower colon. ? Colonoscopy. During this test, a long, thin, flexible tube with a tiny camera (colonoscope) is used to examine your entire colon and rectum.  Prostate cancer screening. Recommendations will vary depending on your family history and other risks.  Hepatitis C blood test.  Hepatitis B blood test.  Sexually transmitted disease (STD) testing.  Diabetes screening. This is done by checking your blood sugar (glucose) after you have not eaten for a while (fasting). You may have this done every 1-3 years.  Abdominal aortic aneurysm (AAA) screening. You may need this if you are a current or former smoker.  Osteoporosis. You may be screened starting at age 72 if you are at high risk. Talk with your health care provider about your test results, treatment options, and if necessary, the need for more tests. Vaccines Your health care provider may recommend certain vaccines, such as:  Influenza vaccine. This is recommended every year.  Tetanus, diphtheria, and acellular pertussis (Tdap, Td) vaccine. You may need a Td booster every 10 years.  Varicella vaccine. You may need this if you have not been vaccinated.  Zoster vaccine. You may need this after age 72.  Measles, mumps, and rubella (MMR) vaccine.  You may need at least one dose of MMR if you were born in 1957 or later. You may also need a second dose.  Pneumococcal 13-valent conjugate (PCV13) vaccine. One dose is recommended after age 72.  Pneumococcal polysaccharide (PPSV23) vaccine. One dose is recommended  after age 72.  Meningococcal vaccine. You may need this if you have certain conditions.  Hepatitis A vaccine. You may need this if you have certain conditions or if you travel or work in places where you may be exposed to hepatitis A.  Hepatitis B vaccine. You may need this if you have certain conditions or if you travel or work in places where you may be exposed to hepatitis B.  Haemophilus influenzae type b (Hib) vaccine. You may need this if you have certain risk factors. Talk to your health care provider about which screenings and vaccines you need and how often you need them. This information is not intended to replace advice given to you by your health care provider. Make sure you discuss any questions you have with your health care provider. Document Released: 06/11/2015 Document Revised: 07/05/2017 Document Reviewed: 03/16/2015 Elsevier Interactive Patient Education  2019 Elsevier Inc.  

## 2018-09-02 ENCOUNTER — Inpatient Hospital Stay: Admission: RE | Admit: 2018-09-02 | Payer: Self-pay | Source: Ambulatory Visit

## 2018-09-10 ENCOUNTER — Other Ambulatory Visit: Payer: Self-pay | Admitting: Family Medicine

## 2018-09-12 ENCOUNTER — Other Ambulatory Visit: Payer: Self-pay | Admitting: Family Medicine

## 2018-09-19 ENCOUNTER — Ambulatory Visit
Admission: RE | Admit: 2018-09-19 | Discharge: 2018-09-19 | Disposition: A | Discharge status: Home / Self Care | Payer: PPO | Source: Ambulatory Visit | Attending: Family Medicine | Admitting: Family Medicine

## 2018-09-19 ENCOUNTER — Encounter: Payer: Self-pay | Admitting: Family Medicine

## 2018-09-19 ENCOUNTER — Ambulatory Visit: Payer: Self-pay

## 2018-09-19 ENCOUNTER — Other Ambulatory Visit: Payer: Self-pay

## 2018-09-19 DIAGNOSIS — Z87891 Personal history of nicotine dependence: Secondary | ICD-10-CM

## 2018-09-19 DIAGNOSIS — I714 Abdominal aortic aneurysm, without rupture, unspecified: Secondary | ICD-10-CM | POA: Insufficient documentation

## 2018-09-19 DIAGNOSIS — Z136 Encounter for screening for cardiovascular disorders: Secondary | ICD-10-CM | POA: Diagnosis not present

## 2018-10-14 ENCOUNTER — Other Ambulatory Visit: Payer: Self-pay | Admitting: Family Medicine

## 2018-10-14 NOTE — Telephone Encounter (Signed)
Refill Request for Cholesterol medication.Pravastatin to [facility]  Last visit: 08/16/2018   Lab Results  Component Value Date   CHOL 175 02/13/2018   HDL 62 02/13/2018   LDLCALC 99 02/13/2018   TRIG 56 02/13/2018   CHOLHDL 2.8 02/13/2018    Follow up on 02/12/2019

## 2018-12-12 ENCOUNTER — Other Ambulatory Visit: Payer: Self-pay

## 2018-12-12 DIAGNOSIS — Z20822 Contact with and (suspected) exposure to covid-19: Secondary | ICD-10-CM

## 2018-12-15 ENCOUNTER — Telehealth: Payer: Self-pay

## 2018-12-15 LAB — NOVEL CORONAVIRUS, NAA: SARS-CoV-2, NAA: DETECTED — AB

## 2018-12-15 NOTE — Telephone Encounter (Signed)
Pt's husband called for lab results. Informed husband results are not back yet.

## 2019-01-23 DIAGNOSIS — G4733 Obstructive sleep apnea (adult) (pediatric): Secondary | ICD-10-CM | POA: Diagnosis not present

## 2019-01-29 ENCOUNTER — Other Ambulatory Visit: Payer: Self-pay | Admitting: Family Medicine

## 2019-02-12 ENCOUNTER — Other Ambulatory Visit: Payer: Self-pay

## 2019-02-12 ENCOUNTER — Ambulatory Visit (INDEPENDENT_AMBULATORY_CARE_PROVIDER_SITE_OTHER): Payer: PPO | Admitting: Family Medicine

## 2019-02-12 ENCOUNTER — Encounter: Payer: Self-pay | Admitting: Family Medicine

## 2019-02-12 VITALS — BP 120/80 | HR 66 | Temp 96.9°F | Resp 16 | Ht 66.5 in | Wt 201.8 lb

## 2019-02-12 DIAGNOSIS — G4733 Obstructive sleep apnea (adult) (pediatric): Secondary | ICD-10-CM

## 2019-02-12 DIAGNOSIS — I10 Essential (primary) hypertension: Secondary | ICD-10-CM

## 2019-02-12 DIAGNOSIS — K219 Gastro-esophageal reflux disease without esophagitis: Secondary | ICD-10-CM | POA: Diagnosis not present

## 2019-02-12 DIAGNOSIS — Z862 Personal history of diseases of the blood and blood-forming organs and certain disorders involving the immune mechanism: Secondary | ICD-10-CM

## 2019-02-12 DIAGNOSIS — I714 Abdominal aortic aneurysm, without rupture, unspecified: Secondary | ICD-10-CM

## 2019-02-12 DIAGNOSIS — E559 Vitamin D deficiency, unspecified: Secondary | ICD-10-CM

## 2019-02-12 DIAGNOSIS — R7989 Other specified abnormal findings of blood chemistry: Secondary | ICD-10-CM

## 2019-02-12 DIAGNOSIS — Z23 Encounter for immunization: Secondary | ICD-10-CM | POA: Diagnosis not present

## 2019-02-12 DIAGNOSIS — E785 Hyperlipidemia, unspecified: Secondary | ICD-10-CM | POA: Diagnosis not present

## 2019-02-12 DIAGNOSIS — R739 Hyperglycemia, unspecified: Secondary | ICD-10-CM | POA: Diagnosis not present

## 2019-02-12 DIAGNOSIS — Z9989 Dependence on other enabling machines and devices: Secondary | ICD-10-CM | POA: Diagnosis not present

## 2019-02-12 MED ORDER — FAMOTIDINE 40 MG PO TABS: 40.0000 mg | ORAL_TABLET | Freq: Two times a day (BID) | ORAL | 0 refills | Status: DC

## 2019-02-12 MED ORDER — OMEPRAZOLE 40 MG PO CPDR: 40.0000 mg | DELAYED_RELEASE_CAPSULE | Freq: Every day | ORAL | 1 refills | Status: DC

## 2019-02-12 MED ORDER — PRAVASTATIN SODIUM 40 MG PO TABS: 40.0000 mg | ORAL_TABLET | Freq: Every day | ORAL | 1 refills | Status: DC

## 2019-02-12 NOTE — Progress Notes (Signed)
Name: Dustin Cobb   MRN: [id number]    DOB: April 24, 1946   Date:02/12/2019       Progress Note  Subjective  Chief Complaint  Chief Complaint  Patient presents with  . Hypertension  . Hyperglycemia  . Anemia  . Gastroesophageal Reflux    HPI  Aneurysm of abdominal aorta: discussed results, repeat in 3 years, also discussed symptoms of dissection  History of COVID-19: he had mild symptoms just noticed some body aches, no fever or chills, no cough but he has noticed fatigue since July, he states he is gradually improving   Hyperlipidemia: taking Pravastatin, no side effects, no chest pain or myalgias,reviewed labs done in 01/2018 and LDL was 99, recheck labs today   HTN:he stopped taking medication n 2017 because bp has been at goal, since retirement.No  SOB or decrease in exercise tolerance . BP is well controlled.We will continue to monitor   Hyperglycemia: hgbA1C  5.8% No polyphagia, polydipsia or polyuria.He has been eating healthier, more vegetables, we will recheck labs   Low TSH: seen by Dr. Gabriel Carina, [facility] to normal She advised yearly follow up labs here in our office, it was slightly lower in September , we will recheck it today.  He denies palpitation, no significant weight loss, no diarrhea, or elevation of bp .  GERD: under control with medication, still not following a diet,he is still drinking sodas and tea but trying to cut down   OSA: wearing CPAPabout 3-4 nights a week for about6 hours. He wakes up feeling restedmost days.  Obesity:discussed trying to go down to below 200 lbs. He is more aware of portion sizes, he is eating more vegetables.he is down to 201 lbs today   AR: on nasal steroid and claritin, still has intermittent symptoms. He has pressure when he mows the yard   Patient Active Problem List   Diagnosis Date Noted  . Aneurysm of abdominal aorta (HCC) 09/19/2018  . Lichen simplex chronicus 02/13/2018  . Rash of hands  08/10/2016  . Hypertension, essential, benign 01/07/2015  . GERD (gastroesophageal reflux disease) 01/07/2015  . OSA on CPAP 01/07/2015  . Low TSH level 01/07/2015  . Hyperglycemia 01/07/2015  . Obesity (BMI 30.0-34.9) 01/07/2015  . Dyslipidemia 01/07/2015  . History of hemorrhoidectomy 01/07/2015  . History of iron deficiency anemia 01/07/2015  . Allergic rhinitis 01/07/2015  . Metabolic syndrome [id number]    Past Surgical History:  Procedure Laterality Date  . HEMORRHOID BANDING  04/01/12   Dr. Fleet Contras    Family History  Problem Relation Age of Onset  . Cancer Mother        Thyroid  . Emphysema Father   . COPD Father   . Hypertension Sister   . Hypertension Brother   . Heart disease Brother   . Healthy Daughter     Social History   Socioeconomic History  . Marital status: Married    Spouse name: Mary  . Number of children: 2  . Years of education: Not on file  . Highest education level: Associate degree: occupational, Hotel manager, or vocational program  Occupational History  . Occupation: retired  Scientific laboratory technician  . Financial resource strain: Not hard at all  . Food insecurity    Worry: Never true    Inability: Never true  . Transportation needs    Medical: No    Non-medical: No  Tobacco Use  . Smoking status: Former Smoker    Packs/day: 1.00    Years: 20.00  Pack years: 20.00    Types: Cigarettes    Start date: 01/07/1975    Quit date: 01/07/1995    Years since quitting: 24.1  . Smokeless tobacco: Never Used  . Tobacco comment: smoking cessation materials not required  Substance and Sexual Activity  . Alcohol use: No    Alcohol/week: 0.0 standard drinks  . Drug use: No  . Sexual activity: Yes    Partners: Female  Lifestyle  . Physical activity    Days per week: 0 days    Minutes per session: 0 min  . Stress: Only a little  Relationships  . Social connections    Talks on phone: More than three times a week    Gets together: More than three  times a week    Attends religious service: More than 4 times per year    Active member of club or organization: No    Attends meetings of clubs or organizations: Never    Relationship status: Married  . Intimate partner violence    Fear of current or ex partner: No    Emotionally abused: No    Physically abused: No    Forced sexual activity: No  Other Topics Concern  . Not on file  Social History Narrative  . Not on file     Current Outpatient Medications:  .  Cholecalciferol (VITAMIN D3) 1000 UNITS CAPS, Take 1 capsule by mouth daily., Disp: , Rfl:  .  famotidine (PEPCID) 40 MG tablet, TAKE 1 TABLET BY MOUTH TWICE DAILY, Disp: 180 tablet, Rfl: 0 .  ferrous sulfate 325 (65 FE) MG tablet, Take 1 tablet by mouth daily., Disp: , Rfl:  .  fluticasone (FLONASE) 50 MCG/ACT nasal spray, Place 2 sprays into both nostrils daily., Disp: 16 g, Rfl: 5 .  loratadine (CLARITIN) 10 MG tablet, Take 1 tablet (10 mg total) by mouth daily., Disp: 30 tablet, Rfl: 5 .  omeprazole (PRILOSEC) 40 MG capsule, TAKE 1 CAPSULE BY MOUTH DAILY, Disp: 90 capsule, Rfl: 1 .  pravastatin (PRAVACHOL) 40 MG tablet, TAKE 1 TABLET BY MOUTH DAILY, Disp: 90 tablet, Rfl: 1 .  triamcinolone cream (KENALOG) 0.1 %, Apply 1 application topically 2 (two) times daily., Disp: 80 g, Rfl: 0  No Known Allergies  I personally reviewed active problem list, medication list, allergies, family history, social history with the patient/caregiver today.   ROS  Constitutional: Negative for fever or weight change.  Respiratory: Negative for cough and shortness of breath.   Cardiovascular: Negative for chest pain or palpitations.  Gastrointestinal: Negative for abdominal pain, no bowel changes.  Musculoskeletal: Negative for gait problem or joint swelling.  Skin: Negative for rash.  Neurological: Negative for dizziness or headache.  No other specific complaints in a complete review of systems (except as listed in HPI  above).   Objective  Vitals:   02/12/19 0828  BP: 120/80  Pulse: 66  Resp: 16  Temp: (!) 96.9 F (36.1 C)  TempSrc: Temporal  SpO2: 98%  Weight: 201 lb 12.8 oz (91.5 kg)  Height: 5' 6.5" (1.689 m)    Body mass index is 32.08 kg/m.  Physical Exam  Constitutional: Patient appears well-developed and well-nourished. Obese  No distress.  HEENT: head atraumatic, normocephalic, pupils equal and reactive to light Cardiovascular: Normal rate, regular rhythm and normal heart sounds.  No murmur heard. No BLE edema. Pulmonary/Chest: Effort normal and breath sounds normal. No respiratory distress. Abdominal: Soft.  There is no tenderness. Psychiatric: Patient has a normal mood  and affect. behavior is normal. Judgment and thought content normal.  Recent Results (from the past 2160 hour(s))  Novel Coronavirus, NAA ([facility])     Status: Abnormal   Collection Time: 12/12/18 10:14 AM  Result Value Ref Range   SARS-CoV-2, NAA Detected (A) Not Detected    Comment: Testing was performed using the cobas(R) SARS-CoV-2 test. This test was developed and its performance characteristics determined by [employer]. This test has not been FDA cleared or approved. This test has been authorized by FDA under an Emergency Use Authorization (EUA). This test is only authorized for the duration of time the declaration that circumstances exist justifying the authorization of the emergency use of in vitro diagnostic tests for detection of SARS-CoV-2 virus and/or diagnosis of COVID-19 infection under section 564(b)(1) of the Act, 21 U.S.C. KA:123727), unless the authorization is terminated or revoked sooner. When diagnostic testing is negative, the possibility of a false negative result should be considered in the context of a patient's recent exposures and the presence of clinical signs and symptoms consistent with COVID-19. An individual without symptoms of COVID-19 and who is not shedding  SARS-CoV-2 virus would expect to have a negati ve (not detected) result in this assay.      PHQ2/9: Depression screen [employer] 2/9 02/12/2019 08/16/2018 08/13/2018 04/23/2018 02/13/2018  Decreased Interest 0 0 1 0 0  Down, Depressed, Hopeless 0 0 1 0 0  PHQ - 2 Score 0 0 2 0 0  Altered sleeping 0 0 1 0 -  Tired, decreased energy 0 0 1 0 -  Change in appetite 0 0 0 0 -  Feeling bad or failure about yourself  0 0 0 0 -  Trouble concentrating 0 0 0 0 -  Moving slowly or fidgety/restless 0 0 0 0 -  Suicidal thoughts 0 0 0 0 -  PHQ-9 Score 0 0 4 0 -  Difficult doing work/chores - Not difficult at all Not difficult at all Not difficult at all -    phq 9 is negative    Fall Risk: Fall Risk  02/12/2019 08/16/2018 08/13/2018 04/23/2018 02/13/2018  Falls in the past year? 0 0 0 0 No  Number falls in past yr: 0 0 0 0 -  Injury with Fall? 0 0 0 0 -  Follow up - - Falls prevention discussed - -     Functional Status Survey: Is the patient deaf or have difficulty hearing?: No Does the patient have difficulty seeing, even when wearing glasses/contacts?: No Does the patient have difficulty concentrating, remembering, or making decisions?: No Does the patient have difficulty walking or climbing stairs?: No Does the patient have difficulty dressing or bathing?: No Does the patient have difficulty doing errands alone such as visiting a doctor's office or shopping?: No    Assessment & Plan   1. Abdominal aortic aneurysm (AAA) without rupture ([place])   2. Need for immunization against influenza  - Flu Vaccine QUAD High Dose(Fluad)  3. OSA on CPAP  Compliant   4. Low TSH level  - TSH  5. Dyslipidemia  - Lipid panel - pravastatin (PRAVACHOL) 40 MG tablet; Take 1 tablet (40 mg total) by mouth daily.  Dispense: 90 tablet; Refill: 1  6. Gastroesophageal reflux disease without esophagitis  - omeprazole (PRILOSEC) 40 MG capsule; Take 1 capsule (40 mg total) by mouth daily.  Dispense: 90  capsule; Refill: 1 - famotidine (PEPCID) 40 MG tablet; Take 1 tablet (40 mg total) by mouth 2 (two) times  daily.  Dispense: 180 tablet; Refill: 0  7. Hyperglycemia  - Hemoglobin A1c  8. Hypertension, essential, benign  - COMPLETE METABOLIC PANEL WITH GFR - CBC with Differential/Platelet  9. Vitamin D deficiency  Taking otc supplementation   10. History of iron deficiency anemia  - CBC with Differential/Platelet - Ferritin

## 2019-02-13 LAB — COMPLETE METABOLIC PANEL WITH GFR
AG Ratio: 1.5 (calc) (ref 1.0–2.5)
ALT: 19 U/L (ref 9–46)
AST: 20 U/L (ref 10–35)
Albumin: 4.2 g/dL (ref 3.6–5.1)
Alkaline phosphatase (APISO): 100 U/L (ref 35–144)
BUN: 18 mg/dL (ref 7–25)
CO2: 27 mmol/L (ref 20–32)
Calcium: 9.6 mg/dL (ref 8.6–10.3)
Chloride: 106 mmol/L (ref 98–110)
Creat: 1.03 mg/dL (ref 0.70–1.18)
GFR, Est African American: 84 mL/min/{1.73_m2} (ref >=60)
GFR, Est Non African American: 72 mL/min/{1.73_m2} (ref >=60)
Globulin: 2.8 g/dL (calc) (ref 1.9–3.7)
Glucose, Bld: 98 mg/dL (ref 65–99)
Potassium: 4.2 mmol/L (ref 3.5–5.3)
Sodium: 138 mmol/L (ref 135–146)
Total Bilirubin: 0.6 mg/dL (ref 0.2–1.2)
Total Protein: 7 g/dL (ref 6.1–8.1)

## 2019-02-13 LAB — CBC WITH DIFFERENTIAL/PLATELET
Absolute Monocytes: 554 cells/uL (ref 200–950)
Basophils Absolute: 28 cells/uL (ref 0–200)
Basophils Relative: 0.5 %
Eosinophils Absolute: 129 cells/uL (ref 15–500)
Eosinophils Relative: 2.3 %
HCT: 45.5 % (ref 38.5–50.0)
Hemoglobin: 14.6 g/dL (ref 13.2–17.1)
Lymphs Abs: 1870 cells/uL (ref 850–3900)
MCH: 27.9 pg (ref 27.0–33.0)
MCHC: 32.1 g/dL (ref 32.0–36.0)
MCV: 87 fL (ref 80.0–100.0)
MPV: 10 fL (ref 7.5–12.5)
Monocytes Relative: 9.9 %
Neutro Abs: 3018 cells/uL (ref 1500–7800)
Neutrophils Relative %: 53.9 %
Platelets: 199 10*3/uL (ref 140–400)
RBC: 5.23 10*6/uL (ref 4.20–5.80)
RDW: 14.5 % (ref 11.0–15.0)
Total Lymphocyte: 33.4 %
WBC: 5.6 10*3/uL (ref 3.8–10.8)

## 2019-02-13 LAB — HEMOGLOBIN A1C
Hgb A1c MFr Bld: 5.8 % of total Hgb — ABNORMAL HIGH (ref <5.7)
Mean Plasma Glucose: 120 (calc)
eAG (mmol/L): 6.6 (calc)

## 2019-02-13 LAB — LIPID PANEL
Cholesterol: 158 mg/dL (ref <200)
HDL: 62 mg/dL (ref >=40)
LDL Cholesterol (Calc): 82 mg/dL (calc)
Non-HDL Cholesterol (Calc): 96 mg/dL (calc) (ref <130)
Total CHOL/HDL Ratio: 2.5 (calc) (ref <5.0)
Triglycerides: 56 mg/dL (ref <150)

## 2019-02-13 LAB — TSH: TSH: 0.21 mIU/L — ABNORMAL LOW (ref 0.40–4.50)

## 2019-02-13 LAB — FERRITIN: Ferritin: 192 ng/mL (ref 24–380)

## 2019-07-21 ENCOUNTER — Ambulatory Visit: Payer: PPO | Attending: Internal Medicine

## 2019-07-21 DIAGNOSIS — Z23 Encounter for immunization: Secondary | ICD-10-CM

## 2019-07-21 NOTE — Progress Notes (Signed)
   Covid-19 Vaccination Clinic  Name:  Dustin Cobb    MRN: [id number] DOB: Nov 02, 1945  07/21/2019  [name] was observed post Covid-19 immunization for 15 minutes without incidence. He was provided with Vaccine Information Sheet and instruction to access the V-Safe system.   [name] was instructed to call 911 with any severe reactions post vaccine: Marland Kitchen Difficulty breathing  . Swelling of your face and throat  . A fast heartbeat  . A bad rash all over your body  . Dizziness and weakness    Immunizations Administered    Name Date Dose VIS Date Route   Moderna COVID-19 Vaccine 07/21/2019 10:10 AM 0.5 mL 04/29/2019 Intramuscular   Manufacturer: Moderna   Lot: [id number]   [id number]

## 2019-07-29 ENCOUNTER — Other Ambulatory Visit: Payer: Self-pay | Admitting: Family Medicine

## 2019-07-29 DIAGNOSIS — K219 Gastro-esophageal reflux disease without esophagitis: Secondary | ICD-10-CM

## 2019-08-01 IMAGING — US US ABDOMINAL AORTA SCREENING AAA
1 series · 14 of 25 positions shown · non-contrast
Comparison: None.

CLINICAL DATA: History of tobacco use. Medicare screening for
abdominal aortic aneurysm.

EXAM:
US ABDOMINAL AORTA MEDICARE SCREENING
TECHNIQUE: Ultrasound examination of the abdominal aorta was performed as a
screening evaluation for abdominal aortic aneurysm.

[Series 1: us abdominal aorta screening aaa · 0.28mm/px · 14 of 28 slices shown]
[im 1/28]
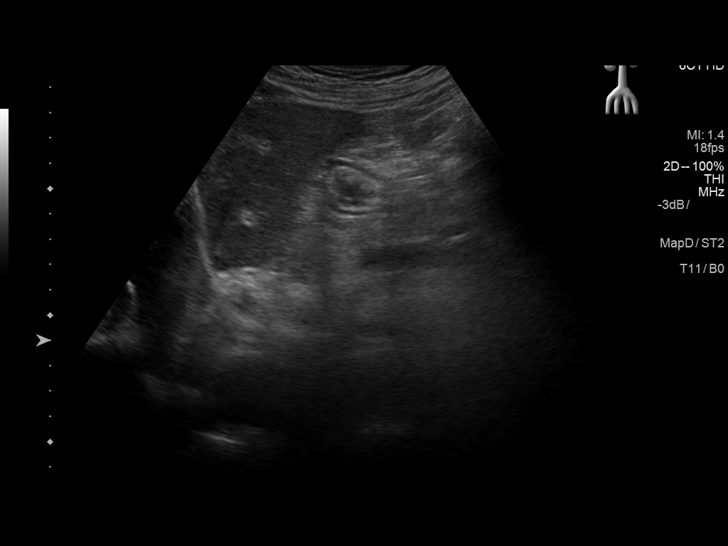
[im 3/28]
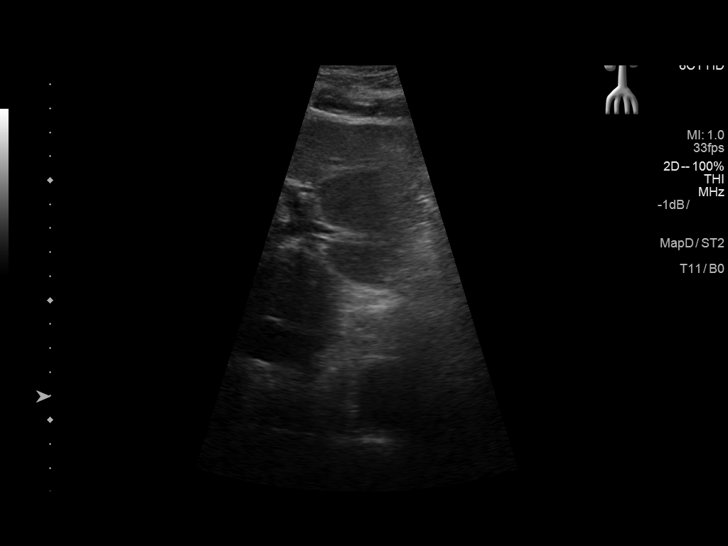
[im 5/28]
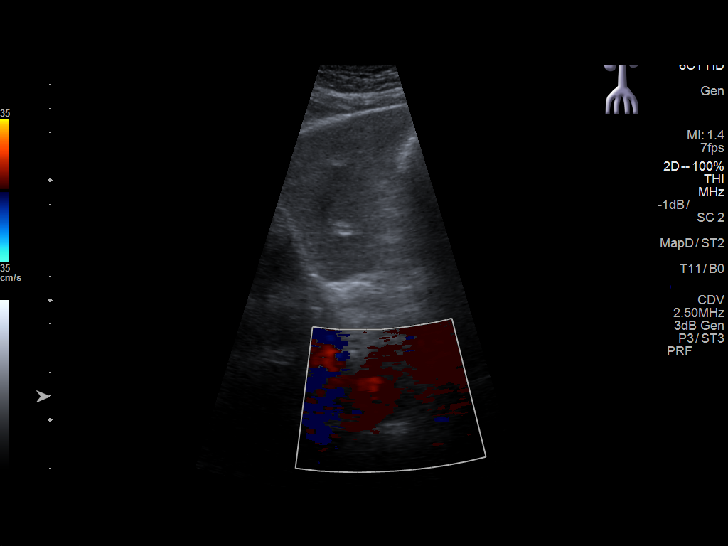
[im 7/28]
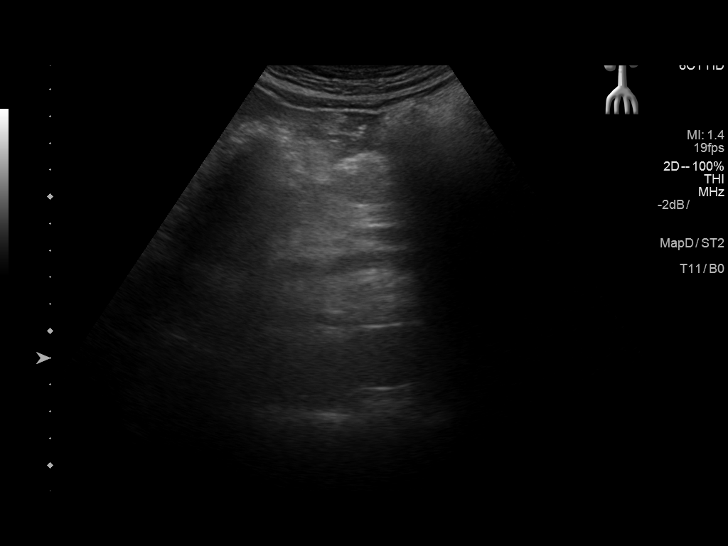
[im 10/28]
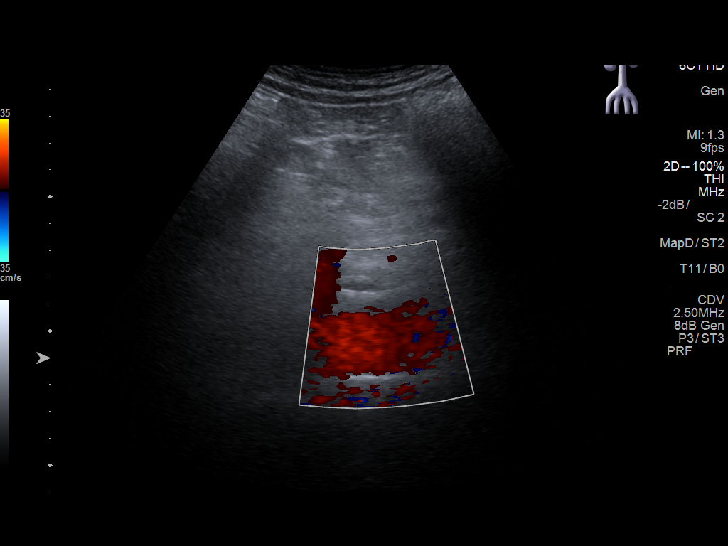
[im 11/28]
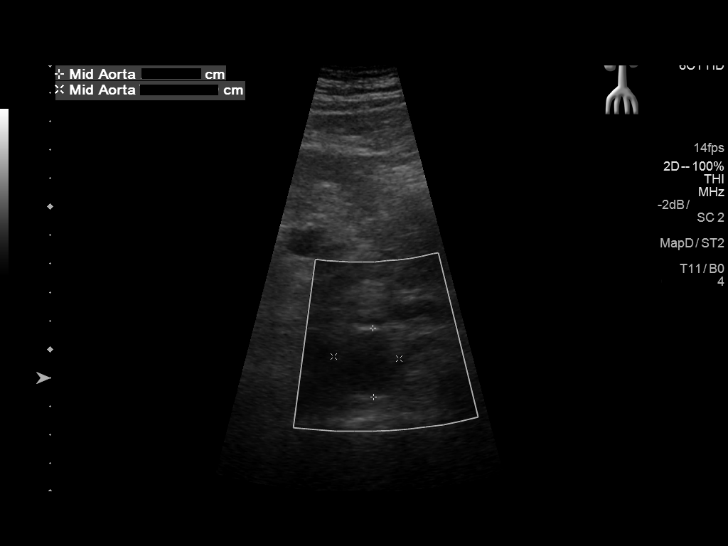
[im 13/28]
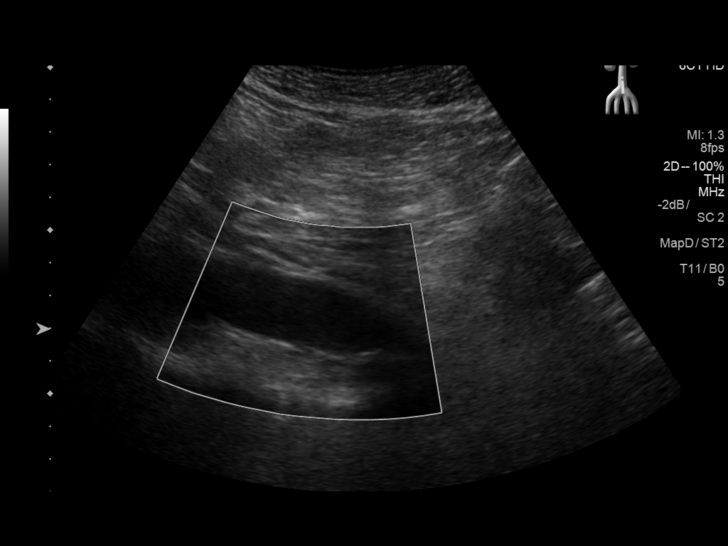
[im 15/28]
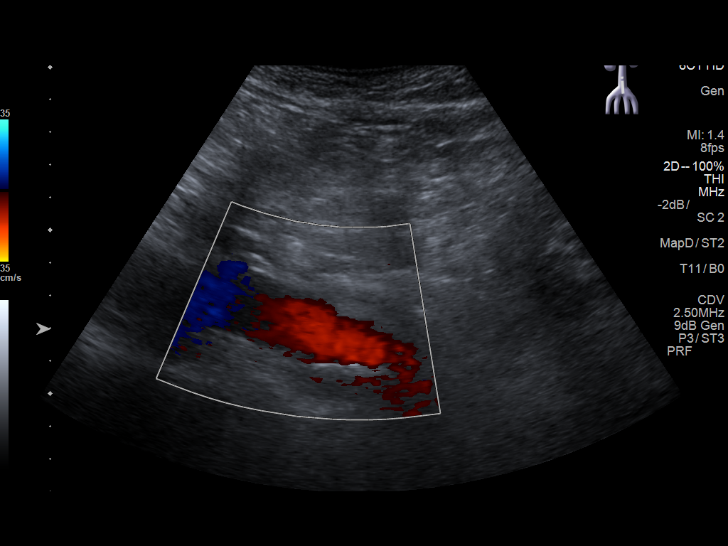
[im 17/28]
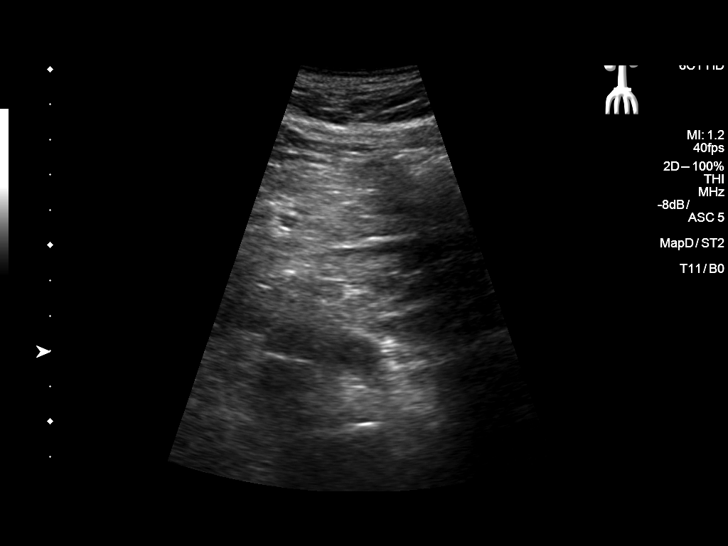
[im 19/28]
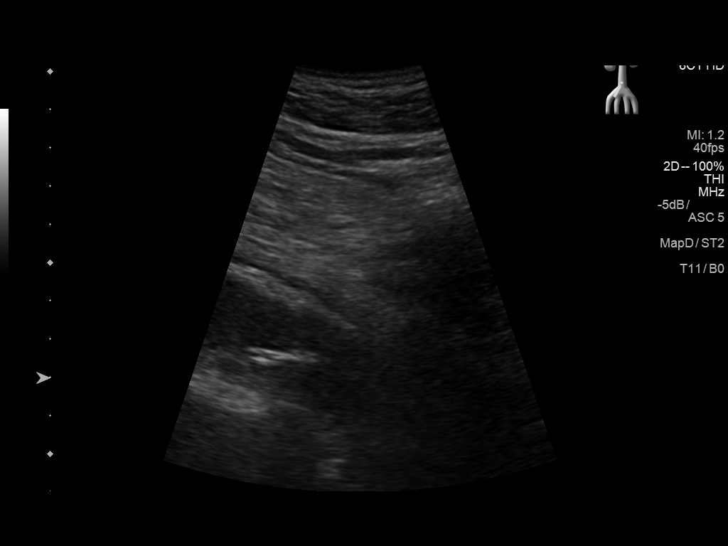
[im 21/28]
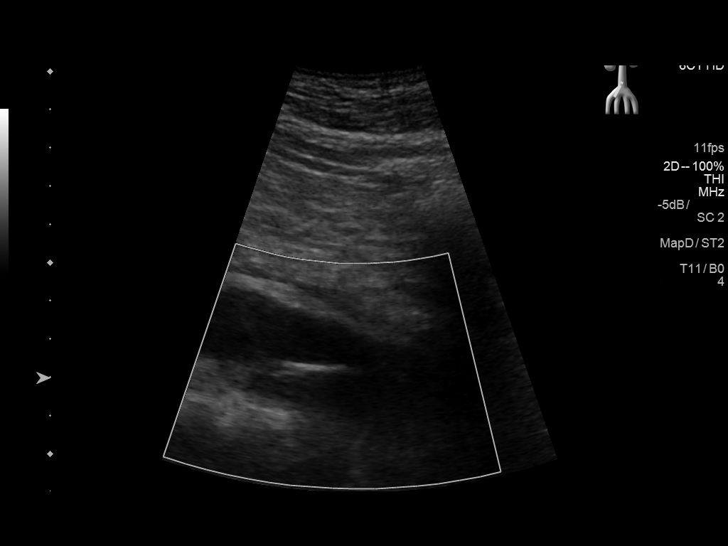
[im 23/28]
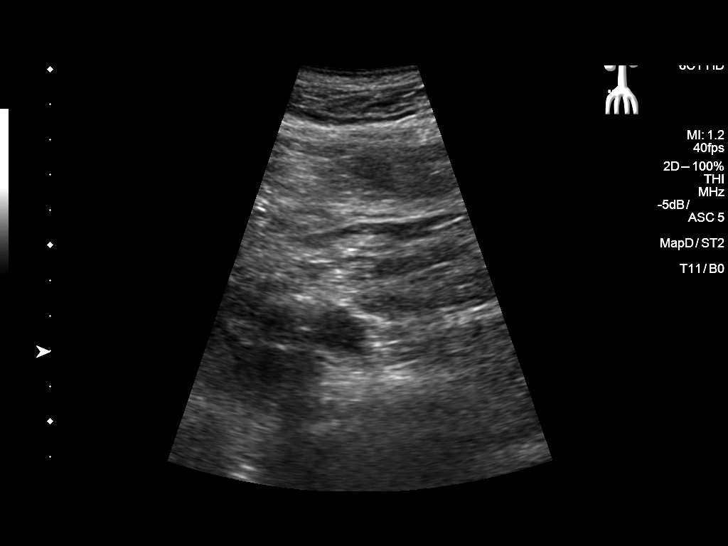
[im 25/28]
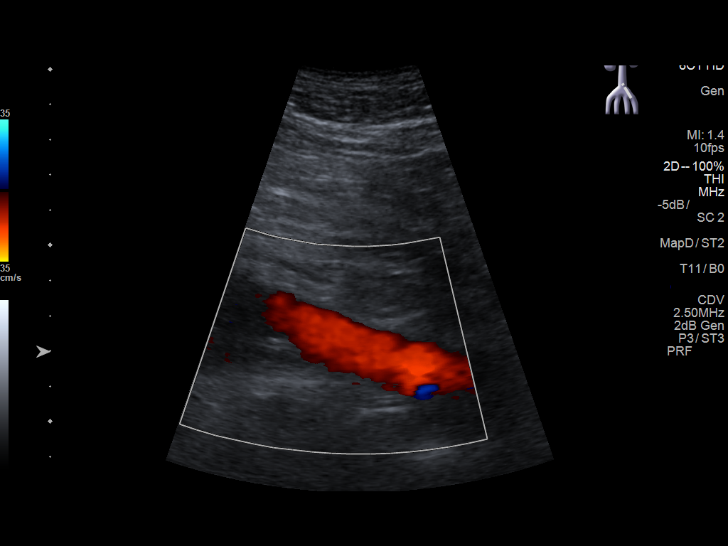
[im 28/28]
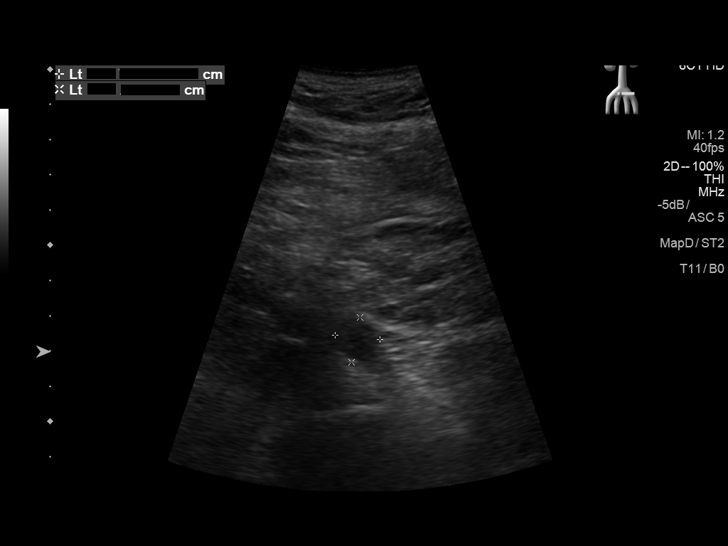

[14 of 25 positions shown; findings below may reference images not displayed]

FINDINGS: Abdominal aortic measurements as follows:

Proximal:  3.1 cm

Mid:  2.4 cm

Distal:  2.2 cm
IMPRESSION: 3.1 cm proximal abdominal aortic aneurysm. Recommend followup by
ultrasound in 3 years. This recommendation follows ACR consensus
guidelines: White Paper of the ACR Incidental Findings Committee II

## 2019-08-14 ENCOUNTER — Ambulatory Visit (INDEPENDENT_AMBULATORY_CARE_PROVIDER_SITE_OTHER): Payer: PPO

## 2019-08-14 DIAGNOSIS — Z0000 Encounter for general adult medical examination without abnormal findings: Secondary | ICD-10-CM

## 2019-08-14 NOTE — Patient Instructions (Signed)
[name] , Thank you for taking time to come for your Medicare Wellness Visit. I appreciate your ongoing commitment to your health goals. Please review the following plan we discussed and let me know if I can assist you in the future.   Screening recommendations/referrals: Colonoscopy: done 12/28/10. Due in 2022. Recommended yearly ophthalmology/optometry visit for glaucoma screening and checkup Recommended yearly dental visit for hygiene and checkup  Vaccinations: Influenza vaccine: done 02/12/19 Pneumococcal vaccine: done 01/07/15 Tdap vaccine: done 01/29/2009 DUE Shingles vaccine: Shingrix discussed. Please contact your pharmacy for coverage information.  Covid-19: 1st dose 07/21/19   Advanced directives: Please bring a copy of your health care power of attorney and living will to the office at your convenience.  Conditions/risks identified: Recommend increasing physical activity   Next appointment: Please follow up in one year for your Medicare Annual Wellness visit.    Preventive Care 1 Years and Older, Male Preventive care refers to lifestyle choices and visits with your health care provider that can promote health and wellness. What does preventive care include?  A yearly physical exam. This is also called an annual well check.  Dental exams once or twice a year.  Routine eye exams. Ask your health care provider how often you should have your eyes checked.  Personal lifestyle choices, including:  Daily care of your teeth and gums.  Regular physical activity.  Eating a healthy diet.  Avoiding tobacco and drug use.  Limiting alcohol use.  Practicing safe sex.  Taking low doses of aspirin every day.  Taking vitamin and mineral supplements as recommended by your health care provider. What happens during an annual well check? The services and screenings done by your health care provider during your annual well check will depend on your age, overall health, lifestyle  risk factors, and family history of disease. Counseling  Your health care provider may ask you questions about your:  Alcohol use.  Tobacco use.  Drug use.  Emotional well-being.  Home and relationship well-being.  Sexual activity.  Eating habits.  History of falls.  Memory and ability to understand (cognition).  Work and work Statistician. Screening  You may have the following tests or measurements:  Height, weight, and BMI.  Blood pressure.  Lipid and cholesterol levels. These may be checked every 5 years, or more frequently if you are over 15 years old.  Skin check.  Lung cancer screening. You may have this screening every year starting at age 21 if you have a 30-pack-year history of smoking and currently smoke or have quit within the past 15 years.  Fecal occult blood test (FOBT) of the stool. You may have this test every year starting at age 58.  Flexible sigmoidoscopy or colonoscopy. You may have a sigmoidoscopy every 5 years or a colonoscopy every 10 years starting at age 57.  Prostate cancer screening. Recommendations will vary depending on your family history and other risks.  Hepatitis C blood test.  Hepatitis B blood test.  Sexually transmitted disease (STD) testing.  Diabetes screening. This is done by checking your blood sugar (glucose) after you have not eaten for a while (fasting). You may have this done every 1-3 years.  Abdominal aortic aneurysm (AAA) screening. You may need this if you are a current or former smoker.  Osteoporosis. You may be screened starting at age 61 if you are at high risk. Talk with your health care provider about your test results, treatment options, and if necessary, the need for more tests.  Vaccines  Your health care provider may recommend certain vaccines, such as:  Influenza vaccine. This is recommended every year.  Tetanus, diphtheria, and acellular pertussis (Tdap, Td) vaccine. You may need a Td booster every 10  years.  Zoster vaccine. You may need this after age 11.  Pneumococcal 13-valent conjugate (PCV13) vaccine. One dose is recommended after age 54.  Pneumococcal polysaccharide (PPSV23) vaccine. One dose is recommended after age 33. Talk to your health care provider about which screenings and vaccines you need and how often you need them. This information is not intended to replace advice given to you by your health care provider. Make sure you discuss any questions you have with your health care provider. Document Released: 06/11/2015 Document Revised: 02/02/2016 Document Reviewed: 03/16/2015 Elsevier Interactive Patient Education  2017 [employer] Prevention in the Home Falls can cause injuries. They can happen to people of all ages. There are many things you can do to make your home safe and to help prevent falls. What can I do on the outside of my home?  Regularly fix the edges of walkways and driveways and fix any cracks.  Remove anything that might make you trip as you walk through a door, such as a raised step or threshold.  Trim any bushes or trees on the path to your home.  Use bright outdoor lighting.  Clear any walking paths of anything that might make someone trip, such as rocks or tools.  Regularly check to see if handrails are loose or broken. Make sure that both sides of any steps have handrails.  Any raised decks and porches should have guardrails on the edges.  Have any leaves, snow, or ice cleared regularly.  Use sand or salt on walking paths during winter.  Clean up any spills in your garage right away. This includes oil or grease spills. What can I do in the bathroom?  Use night lights.  Install grab bars by the toilet and in the tub and shower. Do not use towel bars as grab bars.  Use non-skid mats or decals in the tub or shower.  If you need to sit down in the shower, use a plastic, non-slip stool.  Keep the floor dry. Clean up any water that  spills on the floor as soon as it happens.  Remove soap buildup in the tub or shower regularly.  Attach bath mats securely with double-sided non-slip rug tape.  Do not have throw rugs and other things on the floor that can make you trip. What can I do in the bedroom?  Use night lights.  Make sure that you have a light by your bed that is easy to reach.  Do not use any sheets or blankets that are too big for your bed. They should not hang down onto the floor.  Have a firm chair that has side arms. You can use this for support while you get dressed.  Do not have throw rugs and other things on the floor that can make you trip. What can I do in the kitchen?  Clean up any spills right away.  Avoid walking on wet floors.  Keep items that you use a lot in easy-to-reach places.  If you need to reach something above you, use a strong step stool that has a grab bar.  Keep electrical cords out of the way.  Do not use floor polish or wax that makes floors slippery. If you must use wax, use non-skid floor wax.  Do not have throw rugs and other things on the floor that can make you trip. What can I do with my stairs?  Do not leave any items on the stairs.  Make sure that there are handrails on both sides of the stairs and use them. Fix handrails that are broken or loose. Make sure that handrails are as long as the stairways.  Check any carpeting to make sure that it is firmly attached to the stairs. Fix any carpet that is loose or worn.  Avoid having throw rugs at the top or bottom of the stairs. If you do have throw rugs, attach them to the floor with carpet tape.  Make sure that you have a light switch at the top of the stairs and the bottom of the stairs. If you do not have them, ask someone to add them for you. What else can I do to help prevent falls?  Wear shoes that:  Do not have high heels.  Have rubber bottoms.  Are comfortable and fit you well.  Are closed at the  toe. Do not wear sandals.  If you use a stepladder:  Make sure that it is fully opened. Do not climb a closed stepladder.  Make sure that both sides of the stepladder are locked into place.  Ask someone to hold it for you, if possible.  Clearly mark and make sure that you can see:  Any grab bars or handrails.  First and last steps.  Where the edge of each step is.  Use tools that help you move around (mobility aids) if they are needed. These include:  Canes.  Walkers.  Scooters.  Crutches.  Turn on the lights when you go into a dark area. Replace any light bulbs as soon as they burn out.  Set up your furniture so you have a clear path. Avoid moving your furniture around.  If any of your floors are uneven, fix them.  If there are any pets around you, be aware of where they are.  Review your medicines with your doctor. Some medicines can make you feel dizzy. This can increase your chance of falling. Ask your doctor what other things that you can do to help prevent falls. This information is not intended to replace advice given to you by your health care provider. Make sure you discuss any questions you have with your health care provider. Document Released: 03/11/2009 Document Revised: 10/21/2015 Document Reviewed: 06/19/2014 Elsevier Interactive Patient Education  2017 [employer].

## 2019-08-14 NOTE — Progress Notes (Signed)
Subjective:   Dustin Cobb is a 74 y.o. male who presents for Medicare Annual/Subsequent preventive examination.  Virtual Visit via Telephone Note  I connected with [name] on 08/14/19 at  1:30 PM EDT by telephone and verified that I am speaking with the correct person using two identifiers.  Medicare Annual Wellness visit completed telephonically due to Covid-19 pandemic.   Location: Patient: home Provider: office   I discussed the limitations, risks, security and privacy concerns of performing an evaluation and management service by telephone and the availability of in person appointments. The patient expressed understanding and agreed to proceed.  Some vital signs may be absent or patient reported.   Clemetine Marker, LPN     Review of Systems:  Cardiac Risk Factors include: advanced age (>37men, >75 women);male gender;obesity (BMI >30kg/m2);dyslipidemia     Objective:    Vitals: There were no vitals taken for this visit.  There is no height or weight on file to calculate BMI.  Advanced Directives 08/14/2019 08/13/2018 08/07/2017 02/08/2017 08/10/2016 01/19/2016 07/22/2015  Does Patient Have a Medical Advance Directive? No No No No No No No  Would patient like information on creating a medical advance directive? No - Patient declined Yes (MAU/Ambulatory/Procedural Areas - Information given) Yes (MAU/Ambulatory/Procedural Areas - Information given) - - No - patient declined information -    Tobacco Social History   Tobacco Use  Smoking Status Former Smoker  . Packs/day: 1.00  . Years: 20.00  . Pack years: 20.00  . Types: Cigarettes  . Start date: 01/07/1975  . Quit date: 01/07/1995  . Years since quitting: 24.6  Smokeless Tobacco Never Used  Tobacco Comment   smoking cessation materials not required     Counseling given: Not Answered Comment: smoking cessation materials not required   Clinical Intake:  Pre-visit preparation completed: Yes  Pain :  No/denies pain     Nutritional Risks: None Diabetes: No  How often do you need to have someone help you when you read instructions, pamphlets, or other written materials from your doctor or pharmacy?: 1 - Never  Interpreter Needed?: No  Information entered by :: Clemetine Marker LPN  Past Medical History:  Diagnosis Date  . Aching headache   . Allergy   . ED (erectile dysfunction)   . GERD (gastroesophageal reflux disease)   . Hyperlipidemia   . Hypertension   . Insomnia   . Metabolic syndrome   . Onychomycosis   . OSA on CPAP   . Postural dizziness   . Rash   . Sleep apnea    Past Surgical History:  Procedure Laterality Date  . HEMORRHOID BANDING  04/01/12   Dr. Fleet Contras   Family History  Problem Relation Age of Onset  . Cancer Mother        Thyroid  . Emphysema Father   . COPD Father   . Hypertension Sister   . Hypertension Brother   . Heart disease Brother   . Healthy Daughter    Social History   Socioeconomic History  . Marital status: Married    Spouse name: Mary  . Number of children: 2  . Years of education: Not on file  . Highest education level: Associate degree: occupational, Hotel manager, or vocational program  Occupational History  . Occupation: retired  Tobacco Use  . Smoking status: Former Smoker    Packs/day: 1.00    Years: 20.00    Pack years: 20.00    Types: Cigarettes    Start  date: 01/07/1975    Quit date: 01/07/1995    Years since quitting: 24.6  . Smokeless tobacco: Never Used  . Tobacco comment: smoking cessation materials not required  Substance and Sexual Activity  . Alcohol use: No    Alcohol/week: 0.0 standard drinks  . Drug use: No  . Sexual activity: Yes    Partners: Female    Birth control/protection: None  Other Topics Concern  . Not on file  Social History Narrative  . Not on file   Social Determinants of Health   Financial Resource Strain: Low Risk   . Difficulty of Paying Living Expenses: Not hard at all  Food  Insecurity: No Food Insecurity  . Worried About Charity fundraiser in the Last Year: Never true  . Ran Out of Food in the Last Year: Never true  Transportation Needs: No Transportation Needs  . Lack of Transportation (Medical): No  . Lack of Transportation (Non-Medical): No  Physical Activity: Inactive  . Days of Exercise per Week: 0 days  . Minutes of Exercise per Session: 0 min  Stress: No Stress Concern Present  . Feeling of Stress : Not at all  Social Connections: Slightly Isolated  . Frequency of Communication with Friends and Family: More than three times a week  . Frequency of Social Gatherings with Friends and Family: More than three times a week  . Attends Religious Services: More than 4 times per year  . Active Member of Clubs or Organizations: No  . Attends Archivist Meetings: Never  . Marital Status: Married    Outpatient Encounter Medications as of 08/14/2019  Medication Sig  . Cholecalciferol (VITAMIN D3) 1000 UNITS CAPS Take 1 capsule by mouth daily.  . famotidine (PEPCID) 40 MG tablet TAKE 1 TABLET BY MOUTH 2 TIMES DAILY.  . ferrous sulfate 325 (65 FE) MG tablet Take 1 tablet by mouth daily.  . fluticasone (FLONASE) 50 MCG/ACT nasal spray Place 2 sprays into both nostrils daily.  Marland Kitchen loratadine (CLARITIN) 10 MG tablet Take 1 tablet (10 mg total) by mouth daily.  Marland Kitchen omeprazole (PRILOSEC) 40 MG capsule Take 1 capsule (40 mg total) by mouth daily.  . pravastatin (PRAVACHOL) 40 MG tablet Take 1 tablet (40 mg total) by mouth daily.  Marland Kitchen triamcinolone cream (KENALOG) 0.1 % Apply 1 application topically 2 (two) times daily.   No facility-administered encounter medications on file as of 08/14/2019.    Activities of Daily Living In your present state of health, do you have any difficulty performing the following activities: 08/14/2019 02/12/2019  Hearing? N N  Comment declines hearing aids -  Vision? N N  Difficulty concentrating or making decisions? N N  Walking or  climbing stairs? N N  Dressing or bathing? N N  Doing errands, shopping? N N  Preparing Food and eating ? N -  Using the Toilet? N -  In the past six months, have you accidently leaked urine? N -  Do you have problems with loss of bowel control? N -  Managing your Medications? N -  Managing your Finances? N -  Housekeeping or managing your Housekeeping? N -  Some recent data might be hidden    Patient Care Team: [name], MD as PCP - General (Family Medicine)   Assessment:   This is a routine wellness examination for [place].  Exercise Activities and Dietary recommendations Current Exercise Habits: The patient does not participate in regular exercise at present, Exercise limited by: None identified  Goals    . DIET - INCREASE WATER INTAKE     Recommend to drink at least 6-8 8oz glasses of water per day.    . Increase physical activity     Recommend increasing physical activity to at least 3 days per week        Fall Risk Fall Risk  08/14/2019 02/12/2019 08/16/2018 08/13/2018 04/23/2018  Falls in the past year? 0 0 0 0 0  Number falls in past yr: 0 0 0 0 0  Injury with Fall? 0 0 0 0 0  Risk for fall due to : No Fall Risks - - - -  Follow up Falls prevention discussed - - Falls prevention discussed -   FALL RISK PREVENTION PERTAINING TO THE HOME:  Any stairs in or around the home? Yes  If so, do they handrails? Yes   Home free of loose throw rugs in walkways, pet beds, electrical cords, etc? Yes  Adequate lighting in your home to reduce risk of falls? Yes   ASSISTIVE DEVICES UTILIZED TO PREVENT FALLS:  Life alert? No  Use of a cane, walker or w/c? No  Grab bars in the bathroom? No  Shower chair or bench in shower? No  Elevated toilet seat or a handicapped toilet? No   DME ORDERS:  DME order needed?  No   TIMED UP AND GO:  Was the test performed? No . Telephonic visit.   Education: Fall risk prevention has been discussed.  Intervention(s) required? No      Depression Screen PHQ 2/9 Scores 08/14/2019 02/12/2019 08/16/2018 08/13/2018  PHQ - 2 Score 0 0 0 2  PHQ- 9 Score - 0 0 4    Cognitive Function - 6CIT deferred for 2021 AWV pt has no memory issues     6CIT Screen 08/13/2018 08/07/2017  What Year? 0 points 0 points  What month? 0 points 0 points  What time? 0 points 0 points  Count back from 20 0 points 0 points  Months in reverse 0 points 0 points  Repeat phrase 2 points 4 points  Total Score 2 4    Immunization History  Administered Date(s) Administered  . Fluad Quad(high Dose 65+) 02/12/2019  . Influenza, High Dose Seasonal PF 04/11/2016, 03/05/2017, 02/13/2018  . Influenza,inj,Quad PF,6+ Mos 01/07/2015  . Influenza-Unspecified 03/02/2014  . Moderna SARS-COVID-2 Vaccination 07/21/2019  . Pneumococcal Conjugate-13 01/07/2015  . Pneumococcal Polysaccharide-23 12/18/2013  . Tdap 01/29/2009  . Zoster 07/22/2015    Qualifies for Shingles Vaccine? Yes  Zostavax completed 2017. Due for Shingrix. Education has been provided regarding the importance of this vaccine. Pt has been advised to call insurance company to determine out of pocket expense. Advised may also receive vaccine at local pharmacy or Health Dept. Verbalized acceptance and understanding.  Tdap: Although this vaccine is not a covered service during a Wellness Exam, does the patient still wish to receive this vaccine today?  No .  Education has been provided regarding the importance of this vaccine. Advised may receive this vaccine at local pharmacy or Health Dept. Aware to provide a copy of the vaccination record if obtained from local pharmacy or Health Dept. Verbalized acceptance and understanding.  Flu Vaccine: Up to date  Pneumococcal Vaccine: Up to date   Screening Tests Health Maintenance  Topic Date Due  . TETANUS/TDAP  01/30/2019  . COLONOSCOPY  12/27/2020  . INFLUENZA VACCINE  Completed  . Hepatitis C Screening  Completed  . PNA vac Low Risk Adult  Completed   Cancer Screenings:  Colorectal Screening: Completed 12/28/10. Repeat every 10 years;   Lung Cancer Screening: (Low Dose CT Chest recommended if Age 18-80 years, 30 pack-year currently smoking OR have quit w/in 15years.) does not qualify.   Additional Screening:  Hepatitis C Screening: does qualify; Completed 03/02/14  Vision Screening: Recommended annual ophthalmology exams for early detection of glaucoma and other disorders of the eye. Is the patient up to date with their annual eye exam?  Yes  Who is the provider or what is the name of the office in which the pt attends annual eye exams? Dr. Ellin Mayhew  Dental Screening: Recommended annual dental exams for proper oral hygiene  Community Resource Referral:  CRR required this visit?  No       Plan:    I have personally reviewed and addressed the Medicare Annual Wellness questionnaire and have noted the following in the patient's chart:  A. Medical and social history B. Use of alcohol, tobacco or illicit drugs  C. Current medications and supplements D. Functional ability and status E.  Nutritional status F.  Physical activity G. Advance directives H. List of other physicians I.  Hospitalizations, surgeries, and ER visits in previous 12 months J.  [place] such as hearing and vision if needed, cognitive and depression L. Referrals and appointments   In addition, I have reviewed and discussed with patient certain preventive protocols, quality metrics, and best practice recommendations. A written personalized care plan for preventive services as well as general preventive health recommendations were provided to patient.   Signed,  Clemetine Marker, LPN Nurse Health Advisor   Nurse Notes: patient scheduled for CPE on Monday 08/18/19.

## 2019-08-18 ENCOUNTER — Ambulatory Visit (INDEPENDENT_AMBULATORY_CARE_PROVIDER_SITE_OTHER): Payer: PPO | Admitting: Family Medicine

## 2019-08-18 ENCOUNTER — Other Ambulatory Visit: Payer: Self-pay

## 2019-08-18 ENCOUNTER — Encounter: Payer: Self-pay | Admitting: Family Medicine

## 2019-08-18 VITALS — BP 124/84 | HR 72 | Temp 98.2°F | Resp 16 | Ht 68.0 in | Wt 207.1 lb

## 2019-08-18 DIAGNOSIS — E8881 Metabolic syndrome: Secondary | ICD-10-CM

## 2019-08-18 DIAGNOSIS — I10 Essential (primary) hypertension: Secondary | ICD-10-CM | POA: Diagnosis not present

## 2019-08-18 DIAGNOSIS — K219 Gastro-esophageal reflux disease without esophagitis: Secondary | ICD-10-CM | POA: Diagnosis not present

## 2019-08-18 DIAGNOSIS — G4733 Obstructive sleep apnea (adult) (pediatric): Secondary | ICD-10-CM | POA: Diagnosis not present

## 2019-08-18 DIAGNOSIS — J302 Other seasonal allergic rhinitis: Secondary | ICD-10-CM

## 2019-08-18 DIAGNOSIS — I714 Abdominal aortic aneurysm, without rupture, unspecified: Secondary | ICD-10-CM

## 2019-08-18 DIAGNOSIS — J3089 Other allergic rhinitis: Secondary | ICD-10-CM

## 2019-08-18 DIAGNOSIS — Z0000 Encounter for general adult medical examination without abnormal findings: Secondary | ICD-10-CM

## 2019-08-18 DIAGNOSIS — E785 Hyperlipidemia, unspecified: Secondary | ICD-10-CM

## 2019-08-18 DIAGNOSIS — Z9989 Dependence on other enabling machines and devices: Secondary | ICD-10-CM

## 2019-08-18 DIAGNOSIS — R739 Hyperglycemia, unspecified: Secondary | ICD-10-CM | POA: Diagnosis not present

## 2019-08-18 DIAGNOSIS — Z862 Personal history of diseases of the blood and blood-forming organs and certain disorders involving the immune mechanism: Secondary | ICD-10-CM | POA: Diagnosis not present

## 2019-08-18 DIAGNOSIS — E039 Hypothyroidism, unspecified: Secondary | ICD-10-CM

## 2019-08-18 DIAGNOSIS — E559 Vitamin D deficiency, unspecified: Secondary | ICD-10-CM | POA: Diagnosis not present

## 2019-08-18 DIAGNOSIS — R7989 Other specified abnormal findings of blood chemistry: Secondary | ICD-10-CM | POA: Diagnosis not present

## 2019-08-18 DIAGNOSIS — E038 Other specified hypothyroidism: Secondary | ICD-10-CM

## 2019-08-18 MED ORDER — OMEPRAZOLE 40 MG PO CPDR: 40.0000 mg | DELAYED_RELEASE_CAPSULE | Freq: Every day | ORAL | 1 refills | Status: DC

## 2019-08-18 MED ORDER — ROSUVASTATIN CALCIUM 20 MG PO TABS: 20.0000 mg | ORAL_TABLET | Freq: Every day | ORAL | 1 refills | Status: DC

## 2019-08-18 NOTE — Progress Notes (Signed)
Name: Dustin Cobb   MRN: [id number]    DOB: 1946/01/04   Date:08/18/2019       Progress Note  Subjective  Chief Complaint  Chief Complaint  Patient presents with  . Annual Exam    HPI  Patient presents for annual CPE and follow up  Aneurysm of abdominal aorta: discussed results, repeat in 08/2021 , also discussed symptoms of dissection  Hyperlipidemia: taking Pravastatin, no side effects, no chest pain or myalgias,reviewed labs done in 01/2019, LDL is below 100, but not below 70 , we will change to Crestor and we will recheck labs next visit   HTN:he stopped taking medication n 2017 because bp has been at goal, since retirement.NoSOB or decrease in exercise tolerance. BP was at goal during a dental appointment a couple of weeks ago, today DBP was a little high we will recheck before he goes home   Hyperglycemia: hgbA1C 5.8% No polyphagia, polydipsia or polyuria.He has been eating healthier. We will recheck it yearly   Low TSH: seen by Dr. Gabriel Carina, [facility] to normal but has been suppressed again, however he is asymptomatic and we will continue to monitor yearly and refer back if significant changes .  He denies palpitation, no significant weight loss, no diarrhea.   GERD: under control with medication, still not following a diet, he is still drinking sodas and tea, but states symptoms has been complaining.   OSA: wearing CPAPabout 3-4 nights a week for about6 hours. He wakes up feeling restedmost days.Unchanged   Obesity:discussed trying to go down to below 200 lbs. He is more aware of portion sizes, he is eating more vegetables.he was down to  201 lbs last visit but is up to 207 lbs today, he will try to lose it again   AR: on nasal steroid and claritin, still has intermittent symptoms. He has nasal congestions and sinus pressure, states waxes and wanes   USPSTF grade A and B recommendations:  Diet: still drinks sodas but smaller cans, eats at  home Exercise: he has not been physically active, advised 150 minutes per week.   Depression: phq 9 is negative Depression screen [facility] 2/9 08/18/2019 08/14/2019 02/12/2019 08/16/2018 08/13/2018  Decreased Interest 0 0 0 0 1  Down, Depressed, Hopeless 0 0 0 0 1  PHQ - 2 Score 0 0 0 0 2  Altered sleeping 0 - 0 0 1  Tired, decreased energy 0 - 0 0 1  Change in appetite 0 - 0 0 0  Feeling bad or failure about yourself  0 - 0 0 0  Trouble concentrating 0 - 0 0 0  Moving slowly or fidgety/restless 0 - 0 0 0  Suicidal thoughts 0 - 0 0 0  PHQ-9 Score 0 - 0 0 4  Difficult doing work/chores Not difficult at all - - Not difficult at all Not difficult at all    Hypertension:  BP Readings from Last 3 Encounters:  08/18/19 124/84  02/12/19 120/80  08/16/18 114/86    Obesity: Wt Readings from Last 3 Encounters:  08/18/19 207 lb 1.6 oz (93.9 kg)  02/12/19 201 lb 12.8 oz (91.5 kg)  08/16/18 205 lb 9.6 oz (93.3 kg)   BMI Readings from Last 3 Encounters:  08/18/19 31.49 kg/m  02/12/19 32.08 kg/m  08/16/18 32.69 kg/m     Lipids:  Lab Results  Component Value Date   CHOL 158 02/12/2019   CHOL 175 02/13/2018   CHOL 162 02/08/2017   Lab Results  Component  Value Date   HDL 62 02/12/2019   HDL 62 02/13/2018   HDL 62 02/08/2017   Lab Results  Component Value Date   LDLCALC 82 02/12/2019   LDLCALC 99 02/13/2018   LDLCALC 85 02/08/2017   Lab Results  Component Value Date   TRIG 56 02/12/2019   TRIG 56 02/13/2018   TRIG 68 02/08/2017   Lab Results  Component Value Date   CHOLHDL 2.5 02/12/2019   CHOLHDL 2.8 02/13/2018   CHOLHDL 2.6 02/08/2017   No results found for: LDLDIRECT Glucose:  Glucose  Date Value Ref Range Status  08/29/2012 97 65 - 99 mg/dL Final  07/28/2011 94 65 - 99 mg/dL Final   Glucose, Bld  Date Value Ref Range Status  02/12/2019 98 65 - 99 mg/dL Final    Comment:    .            Fasting reference interval .   02/13/2018 91 65 - 99 mg/dL Final     Comment:    .            Fasting reference interval .   08/13/2017 95 65 - 99 mg/dL Final    Comment:    .            Fasting reference interval .       Office Visit from 08/18/2019 in [facility]  AUDIT-C Score  0       Married STD testing and prevention (HIV/chl/gon/syphilis):  Hep C: 02/2014   Skin cancer: Discussed monitoring for atypical lesions Colorectal cancer: repeat in 2022  Prostate cancer: discussed USPTF  Lab Results  Component Value Date   PSA Normal  10/17/2013    IPSS Questionnaire (AUA-7): Over the past month.   1)  How often have you had a sensation of not emptying your bladder completely after you finish urinating?  0 - Not at all  2)  How often have you had to urinate again less than two hours after you finished urinating? 1 - Less than 1 time in 5  3)  How often have you found you stopped and started again several times when you urinated?  0 - Not at all  4) How difficult have you found it to postpone urination?  0 - Not at all  5) How often have you had a weak urinary stream?  0 - Not at all  6) How often have you had to push or strain to begin urination?  0 - Not at all  7) How many times did you most typically get up to urinate from the time you went to bed until the time you got up in the morning?  0 - None  Total score:  0-7 mildly symptomatic   8-19 moderately symptomatic   20-35 severely symptomatic    Lung cancer:   Low Dose CT Chest recommended if Age 80-80 years, 30 pack-year currently smoking OR have quit w/in 15years. Patient does not qualify.   AAA:  He has an aneurysm and is being monitoring  ECG:  08/16/2018  Advanced Care Planning: A voluntary discussion about advance care planning including the explanation and discussion of advance directives.  Discussed health care proxy and Living will, and the patient was able to identify a health care proxy as wife .  Patient does not have a living will at present time.    Patient Active Problem List   Diagnosis Date Noted  . Aneurysm of abdominal aorta (HCC)  09/19/2018  . Lichen simplex chronicus 02/13/2018  . Rash of hands 08/10/2016  . Hypertension, essential, benign 01/07/2015  . GERD (gastroesophageal reflux disease) 01/07/2015  . OSA on CPAP 01/07/2015  . Low TSH level 01/07/2015  . Hyperglycemia 01/07/2015  . Obesity (BMI 30.0-34.9) 01/07/2015  . Dyslipidemia 01/07/2015  . History of hemorrhoidectomy 01/07/2015  . History of iron deficiency anemia 01/07/2015  . Allergic rhinitis 01/07/2015  . Metabolic syndrome [id number]    Past Surgical History:  Procedure Laterality Date  . HEMORRHOID BANDING  04/01/12   Dr. Fleet Contras    Family History  Problem Relation Age of Onset  . Cancer Mother        Thyroid  . Emphysema Father   . COPD Father   . Hypertension Sister   . Hypertension Brother   . Heart disease Brother   . Healthy Daughter     Social History   Socioeconomic History  . Marital status: Married    Spouse name: Mary  . Number of children: 2  . Years of education: Not on file  . Highest education level: Associate degree: occupational, Hotel manager, or vocational program  Occupational History  . Occupation: retired  Tobacco Use  . Smoking status: Former Smoker    Packs/day: 1.00    Years: 20.00    Pack years: 20.00    Types: Cigarettes    Start date: 01/07/1975    Quit date: 01/07/1995    Years since quitting: 24.6  . Smokeless tobacco: Never Used  . Tobacco comment: smoking cessation materials not required  Substance and Sexual Activity  . Alcohol use: No    Alcohol/week: 0.0 standard drinks  . Drug use: No  . Sexual activity: Yes    Partners: Female    Birth control/protection: None  Other Topics Concern  . Not on file  Social History Narrative  . Not on file   Social Determinants of Health   Financial Resource Strain: Low Risk   . Difficulty of Paying Living Expenses: Not hard at all  Food Insecurity: No  Food Insecurity  . Worried About Charity fundraiser in the Last Year: Never true  . Ran Out of Food in the Last Year: Never true  Transportation Needs: No Transportation Needs  . Lack of Transportation (Medical): No  . Lack of Transportation (Non-Medical): No  Physical Activity: Inactive  . Days of Exercise per Week: 0 days  . Minutes of Exercise per Session: 0 min  Stress: No Stress Concern Present  . Feeling of Stress : Not at all  Social Connections: Slightly Isolated  . Frequency of Communication with Friends and Family: More than three times a week  . Frequency of Social Gatherings with Friends and Family: More than three times a week  . Attends Religious Services: More than 4 times per year  . Active Member of Clubs or Organizations: No  . Attends Archivist Meetings: Never  . Marital Status: Married  Human resources officer Violence: Not At Risk  . Fear of Current or Ex-Partner: No  . Emotionally Abused: No  . Physically Abused: No  . Sexually Abused: No     Current Outpatient Medications:  .  Cholecalciferol (VITAMIN D3) 1000 UNITS CAPS, Take 1 capsule by mouth daily., Disp: , Rfl:  .  famotidine (PEPCID) 40 MG tablet, TAKE 1 TABLET BY MOUTH 2 TIMES DAILY., Disp: 180 tablet, Rfl: 1 .  ferrous sulfate 325 (65 FE) MG tablet, Take 1 tablet by mouth daily., Disp: ,  Rfl:  .  fluticasone (FLONASE) 50 MCG/ACT nasal spray, Place 2 sprays into both nostrils daily., Disp: 16 g, Rfl: 5 .  loratadine (CLARITIN) 10 MG tablet, Take 1 tablet (10 mg total) by mouth daily., Disp: 30 tablet, Rfl: 5 .  omeprazole (PRILOSEC) 40 MG capsule, Take 1 capsule (40 mg total) by mouth daily., Disp: 90 capsule, Rfl: 1 .  triamcinolone cream (KENALOG) 0.1 %, Apply 1 application topically 2 (two) times daily., Disp: 80 g, Rfl: 0 .  rosuvastatin (CRESTOR) 20 MG tablet, Take 1 tablet (20 mg total) by mouth daily., Disp: 90 tablet, Rfl: 1  No Known Allergies   ROS  Constitutional: Negative for  fever, positive for mild  weight change.  Respiratory: Negative for cough and shortness of breath.   Cardiovascular: Negative for chest pain or palpitations.  Gastrointestinal: Negative for abdominal pain, no bowel changes.  Musculoskeletal: Negative for gait problem or joint swelling.  Skin: Negative for rash.  Neurological: Negative for dizziness or headache.  No other specific complaints in a complete review of systems (except as listed in HPI above).  Objective  Vitals:   08/18/19 0754 08/18/19 0908  BP: (!) 128/92 124/84  Pulse: 72   Resp: 16   Temp: 98.2 F (36.8 C)   TempSrc: Temporal   SpO2: 94%   Weight: 207 lb 1.6 oz (93.9 kg)   Height: 5\' 8"  (1.727 m)     Body mass index is 31.49 kg/m.  Physical Exam  Constitutional: Patient appears well-developed and well-nourished. No distress.  HENT: Head: Normocephalic and atraumatic. Ears: B TMs ok, no erythema or effusion; Nose: not done  Mouth/Throat: not done  Eyes: Conjunctivae and EOM are normal. Pupils are equal, round, and reactive to light. No scleral icterus.  Neck: Normal range of motion. Neck supple. No JVD present. No thyromegaly present.  Cardiovascular: Normal rate, regular rhythm and normal heart sounds.  No murmur heard. No BLE edema. Pulmonary/Chest: Effort normal and breath sounds normal. No respiratory distress. Abdominal: Soft. Bowel sounds are normal, no distension. There is no tenderness. no masses MALE GENITALIA: Normal descended testes bilaterally, no masses palpated, no hernias, no lesions, no discharge RECTAL: not done Musculoskeletal: Normal range of motion, no joint effusions. No gross deformities Neurological: he is alert and oriented to person, place, and time. No cranial nerve deficit. Coordination, balance, strength, speech and gait are normal.  Skin: Skin is warm and dry. No rash noted. No erythema.  Psychiatric: Patient has a normal mood and affect. behavior is normal. Judgment and thought  content normal.  PHQ2/9: Depression screen [facility] 2/9 08/18/2019 08/14/2019 02/12/2019 08/16/2018 08/13/2018  Decreased Interest 0 0 0 0 1  Down, Depressed, Hopeless 0 0 0 0 1  PHQ - 2 Score 0 0 0 0 2  Altered sleeping 0 - 0 0 1  Tired, decreased energy 0 - 0 0 1  Change in appetite 0 - 0 0 0  Feeling bad or failure about yourself  0 - 0 0 0  Trouble concentrating 0 - 0 0 0  Moving slowly or fidgety/restless 0 - 0 0 0  Suicidal thoughts 0 - 0 0 0  PHQ-9 Score 0 - 0 0 4  Difficult doing work/chores Not difficult at all - - Not difficult at all Not difficult at all     Fall Risk: Fall Risk  08/18/2019 08/14/2019 02/12/2019 08/16/2018 08/13/2018  Falls in the past year? 0 0 0 0 0  Number falls in past yr:  0 0 0 0 0  Injury with Fall? 0 0 0 0 0  Risk for fall due to : - No Fall Risks - - -  Follow up - Falls prevention discussed - - Falls prevention discussed     Functional Status Survey: Is the patient deaf or have difficulty hearing?: No Does the patient have difficulty seeing, even when wearing glasses/contacts?: No Does the patient have difficulty concentrating, remembering, or making decisions?: No Does the patient have difficulty walking or climbing stairs?: No Does the patient have difficulty dressing or bathing?: No Does the patient have difficulty doing errands alone such as visiting a doctor's office or shopping?: No    Assessment & Plan   1. Abdominal aortic aneurysm (AAA) without rupture (HCC)  - rosuvastatin (CRESTOR) 20 MG tablet; Take 1 tablet (20 mg total) by mouth daily.  Dispense: 90 tablet; Refill: 1  2. OSA on CPAP  Needs to increase compliance   3. Dyslipidemia  - rosuvastatin (CRESTOR) 20 MG tablet; Take 1 tablet (20 mg total) by mouth daily.  Dispense: 90 tablet; Refill: 1  4. Low TSH level  Monitored yearly   5. Well adult exam  He needs to increase physical activity, cannot take aspirin because of GERD and history of iron deficiency anemia  6.  Hyperglycemia   7. Hypertension, essential, benign  Recheck bp before he leaves, monitor at home   8. Gastroesophageal reflux disease without esophagitis  - omeprazole (PRILOSEC) 40 MG capsule; Take 1 capsule (40 mg total) by mouth daily.  Dispense: 90 capsule; Refill: 1  9. Vitamin D deficiency  Continue supplementation   10. History of iron deficiency anemia   11. Metabolic syndrome  Try to cut down even more on sodas  12. Subclinical hypothyroidism  We will continue to check it yearly   13. Perennial allergic rhinitis with seasonal variation    -Prostate cancer screening and PSA options (with potential risks and benefits of testing vs not testing) were discussed along with recent recs/guidelines. -USPSTF grade A and B recommendations reviewed with patient; age-appropriate recommendations, preventive care, screening tests, etc discussed and encouraged; healthy living encouraged; see AVS for patient education given to patient -Discussed importance of 150 minutes of physical activity weekly, eat two servings of fish weekly, eat one serving of tree nuts ( cashews, pistachios, pecans, almonds.Marland Kitchen) every other day, eat 6 servings of fruit/vegetables daily and drink plenty of water and avoid sweet beverages.

## 2019-08-18 NOTE — Patient Instructions (Signed)

## 2019-08-19 ENCOUNTER — Ambulatory Visit: Payer: PPO | Attending: Internal Medicine

## 2019-08-19 DIAGNOSIS — Z23 Encounter for immunization: Secondary | ICD-10-CM

## 2019-08-19 NOTE — Progress Notes (Signed)
   Covid-19 Vaccination Clinic  Name:  Dustin Cobb    MRN: [id number] DOB: [birth year]  08/19/2019  [name] was observed post Covid-19 immunization for 15 minutes without incident. He was provided with Vaccine Information Sheet and instruction to access the V-Safe system.   [name] was instructed to call 911 with any severe reactions post vaccine: Marland Kitchen Difficulty breathing  . Swelling of face and throat  . A fast heartbeat  . A bad rash all over body  . Dizziness and weakness   Immunizations Administered    Name Date Dose VIS Date Route   Moderna COVID-19 Vaccine 08/19/2019 10:12 AM 0.5 mL 04/29/2019 Intramuscular   Manufacturer: Levan Hurst   LotUT:[id number]   [place]:[id number]

## 2019-10-15 ENCOUNTER — Telehealth: Payer: Self-pay | Admitting: Family Medicine

## 2019-10-15 NOTE — Chronic Care Management (AMB) (Signed)
  Chronic Care Management   Outreach Note  10/15/2019 Name: Dustin Cobb MRN: [id number] DOB: 1945-07-04  Dustin Cobb is a 74 y.o. year old male who is a primary care patient of [name], MD. I reached out to Dustin Cobb by phone today in response to a referral sent by Dustin Cobb health plan.     An unsuccessful telephone outreach was attempted today. The patient was referred to the case management team for assistance with care management and care coordination.   Follow Up Plan: A HIPPA compliant phone message was left for the patient providing contact information and requesting a return call.  The care management team will reach out to the patient again over the next 7 days.  If patient returns call to provider office, please advise to call [facility] at [address], [address], [place], [place] [address] Direct Dial: [phone] [email or web address] Website: [email or web address]

## 2019-10-21 NOTE — Chronic Care Management (AMB) (Signed)
  Chronic Care Management   Note  10/21/2019 Name: CADELL GABRIELSON MRN: [id number] DOB: 1946/03/19  Tana Conch Altier is a 75 y.o. year old male who is a primary care patient of [name], MD. I reached out to [name] by phone today in response to a referral sent by Mr. Kuper Rennels Branam's health plan.     Mr. Sequeira was given information about Chronic Care Management services today including:  1. CCM service includes personalized support from designated clinical staff supervised by his physician, including individualized plan of care and coordination with other care providers 2. 24/7 contact phone numbers for assistance for urgent and routine care needs. 3. Service will only be billed when office clinical staff spend 20 minutes or more in a month to coordinate care. 4. Only one practitioner may furnish and bill the service in a calendar month. 5. The patient may stop CCM services at any time (effective at the end of the month) by phone call to the office staff. 6. The patient will be responsible for cost sharing (co-pay) of up to 20% of the service fee (after annual deductible is met).  Patient agreed to services and verbal consent obtained.   Follow up plan: Telephone appointment with care management team member scheduled for:11/19/2019  Noreene Larsson, [address], [place], [place] [address] Direct Dial: [phone] [email or web address] Website: [email or web address]

## 2019-10-28 ENCOUNTER — Telehealth: Payer: Self-pay | Admitting: Family Medicine

## 2019-10-28 NOTE — Telephone Encounter (Signed)
Pt called stating that his acid reflux has begun to get out of control in the last 30 days. Pt is concerned that the his newest prescription, rosuvastatin, is causing it to flare up. Please advise.    [facility], [address] [place] [address]  Phone: [phone] Fax: [phone]  Not a 24 hour pharmacy; exact hours not known.

## 2019-10-30 ENCOUNTER — Other Ambulatory Visit: Payer: Self-pay | Admitting: Family Medicine

## 2019-10-30 DIAGNOSIS — E785 Hyperlipidemia, unspecified: Secondary | ICD-10-CM

## 2019-10-31 ENCOUNTER — Other Ambulatory Visit: Payer: Self-pay | Admitting: Family Medicine

## 2019-10-31 DIAGNOSIS — E785 Hyperlipidemia, unspecified: Secondary | ICD-10-CM

## 2019-11-19 ENCOUNTER — Ambulatory Visit (INDEPENDENT_AMBULATORY_CARE_PROVIDER_SITE_OTHER): Payer: PPO

## 2019-11-19 DIAGNOSIS — I10 Essential (primary) hypertension: Secondary | ICD-10-CM | POA: Diagnosis not present

## 2019-11-19 DIAGNOSIS — E785 Hyperlipidemia, unspecified: Secondary | ICD-10-CM | POA: Diagnosis not present

## 2019-12-03 NOTE — Patient Instructions (Signed)
Thank you for allowing the Chronic Care Management team to participate in your care.  Goals Addressed            This Visit's Progress   . Chronic Disease Management       CARE PLAN ENTRY (see longitudinal plan of care for additional care plan information)  Current Barriers:  . Chronic Disease Management support and education needs related to Hypertension and Hyperlipidemia.  Case Manager Clinical Goal(s):  Over the next 120 days, patient will: . Continue attending medical appointments as scheduled and take medications as prescribed. . Continue monitoring blood pressure and report readings outside of established range. . Continue engaging in non-strenuous activity as tolerated and adhere to recommended safety measures to prevent falls and injuries. Over the next 60 days, patient will: . Contact/update care management team if health status or care needs change.   Interventions:  . Inter-disciplinary care team collaboration (see longitudinal plan of care) . Reviewed medications. Encouraged to continue taking medications as prescribed and notify care management team with concerns regarding prescriptions costs. . Discussed current activity level. Provided information regarding safety measures to prevent falls and injuries. Reports mowing, gardening and engaging in low-impact activities regularly. Denies episodes of chest pain, shortness of breath or overexertion. Reports ambulating without difficulty. Denies falls. . Discussed  adherence to current treatment plans as established by his PCP. Report doing "very good." Monitors BP routinely and reports readings have been within established range. Uses CPAP as recommended. Will follow-up if assistance is needed with obtaining new tubing and supplies. . Reviewed scheduled/pending appointments. Encouraged to continue attending appointments as scheduled to prevent delays in care.  . Discussed plans for ongoing care management and follow up.  Provided direct contact information for care management team. Doing  very well with self-management. Denies urgent concerns or immediate care management needs. Agreeable to follow-up in three months.   Patient Self Care Activities:  . Self administers medications . Attends  scheduled provider appointments . Calls pharmacy for medication refills . Performs ADL's independently . Performs IADL's independently . Calls provider office for new concerns or questions   Initial goal documentation        Mr. Brunsman was given information about Chronic Care Management services including:  1. CCM service includes personalized support from designated clinical staff supervised by his physician, including individualized plan of care and coordination with other care providers 2. 24/7 contact phone numbers for assistance for urgent and routine care needs. 3. Service will only be billed when office clinical staff spend 20 minutes or more in a month to coordinate care. 4. Only one practitioner may furnish and bill the service in a calendar month. 5. The patient may stop CCM services at any time (effective at the end of the month) by phone call to the office staff. 6. The patient will be responsible for cost sharing (co-pay) of up to 20% of the service fee (after annual deductible is met).  Patient agreed to services and verbal consent obtained.    Mr. Osier verbalized understanding of the instructions provided during the telephonic outreach today. Declined need for a mailed/printed copy of the instructions.   The care management team will follow-up with Mr. Dunckel in three months.    Felecia McCray,RN [facility] Care Management [phone]      

## 2019-12-03 NOTE — Chronic Care Management (AMB) (Signed)
Chronic Care Management   Initial Visit Note   Name: Dustin Cobb MRN: [id number] DOB: 08-26-1945  Primary Care Provider: [name], MD Reason for referral : Chronic Care Management   Dustin Cobb is a 74 y.o. year old male who is a primary care patient of [name], MD. The CCM team was consulted for assistance with chronic disease management and care coordination.   Review of Dustin Cobb's status, including review of consultants reports, relevant labs and test results was conducted today. Collaboration with appropriate care team members was performed as part of the comprehensive evaluation and provision of chronic care management services.     SDOH (Social Determinants of Health) assessments performed: Yes No interventions required at this time.    Medications: Outpatient Encounter Medications as of 11/19/2019  Medication Sig Note  . Cholecalciferol (VITAMIN D3) 1000 UNITS CAPS Take 1 capsule by mouth daily. 01/07/2015: Received from: [place]: Take by mouth.  . famotidine (PEPCID) 40 MG tablet TAKE 1 TABLET BY MOUTH 2 TIMES DAILY.   . ferrous sulfate 325 (65 FE) MG tablet Take 1 tablet by mouth daily. 01/07/2015: Received from: [place]: Take by mouth.  . loratadine (CLARITIN) 10 MG tablet Take 1 tablet (10 mg total) by mouth daily.   Marland Kitchen omeprazole (PRILOSEC) 40 MG capsule Take 1 capsule (40 mg total) by mouth daily.   . rosuvastatin (CRESTOR) 20 MG tablet Take 1 tablet (20 mg total) by mouth daily.   Marland Kitchen triamcinolone cream (KENALOG) 0.1 % Apply 1 application topically 2 (two) times daily.   . fluticasone (FLONASE) 50 MCG/ACT nasal spray Place 2 sprays into both nostrils daily.    No facility-administered encounter medications on file as of 11/19/2019.     Objective:  BP Readings from Last 3 Encounters:  08/18/19 124/84  02/12/19 120/80  08/16/18 114/86   Lab Results  Component Value Date     CHOL 158 02/12/2019   HDL 62 02/12/2019   LDLCALC 82 02/12/2019   TRIG 56 02/12/2019   CHOLHDL 2.5 02/12/2019    Lab Results  Component Value Date   HGBA1C 5.8 (H) 02/12/2019    Goals Addressed            This Visit's Progress   . Chronic Disease Management       CARE PLAN ENTRY (see longitudinal plan of care for additional care plan information)  Current Barriers:  . Chronic Disease Management support and education needs related to Hypertension and Hyperlipidemia.  Case Manager Clinical Goal(s):  Over the next 120 days, patient will: . Continue attending medical appointments as scheduled and take medications as prescribed. . Continue monitoring blood pressure and report readings outside of established range. . Continue engaging in non-strenuous activity as tolerated and adhere to recommended safety measures to prevent falls and injuries. Over the next 60 days, patient will: . Contact/update care management team if health status or care needs change.   Interventions:  . Inter-disciplinary care team collaboration (see longitudinal plan of care) . Reviewed medications. Encouraged to continue taking medications as prescribed and notify care management team with concerns regarding prescriptions costs. . Discussed current activity level. Provided information regarding safety measures to prevent falls and injuries. Reports mowing, gardening and engaging in low-impact activities regularly. Denies episodes of chest pain, shortness of breath or overexertion. Reports ambulating without difficulty. Denies falls. . Discussed  adherence to current treatment plans as established by his PCP. Report doing "  very good." Monitors BP routinely and reports readings have been within established range. Uses CPAP as recommended. Will follow-up if assistance is needed with obtaining new tubing and supplies. . Reviewed scheduled/pending appointments. Encouraged to continue attending appointments as  scheduled to prevent delays in care.  . Discussed plans for ongoing care management and follow up. Provided direct contact information for care management team. Doing  very well with self-management. Denies urgent concerns or immediate care management needs. Agreeable to follow-up in three months.   Patient Self Care Activities:  . Self administers medications . Attends  scheduled provider appointments . Calls pharmacy for medication refills . Performs ADL's independently . Performs IADL's independently . Calls provider office for new concerns or questions   Initial goal documentation         Dustin Cobb was given information about Chronic Care Management services including:  1. CCM service includes personalized support from designated clinical staff supervised by his physician, including individualized plan of care and coordination with other care providers 2. 24/7 contact phone numbers for assistance for urgent and routine care needs. 3. Service will only be billed when office clinical staff spend 20 minutes or more in a month to coordinate care. 4. Only one practitioner may furnish and bill the service in a calendar month. 5. The patient may stop CCM services at any time (effective at the end of the month) by phone call to the office staff. 6. The patient will be responsible for cost sharing (co-pay) of up to 20% of the service fee (after annual deductible is met).  Patient agreed to services and verbal consent obtained.    PLAN The care management team will follow-up with Dustin Cobb in three months. Encouraged to contact team with any urgent care management concerns if needed prior to the next outreach.   [facility] Care Management [phone]

## 2020-01-22 ENCOUNTER — Other Ambulatory Visit: Payer: Self-pay | Admitting: Family Medicine

## 2020-01-22 DIAGNOSIS — K219 Gastro-esophageal reflux disease without esophagitis: Secondary | ICD-10-CM

## 2020-02-16 ENCOUNTER — Ambulatory Visit (INDEPENDENT_AMBULATORY_CARE_PROVIDER_SITE_OTHER): Payer: PPO

## 2020-02-16 DIAGNOSIS — I10 Essential (primary) hypertension: Secondary | ICD-10-CM | POA: Diagnosis not present

## 2020-02-16 DIAGNOSIS — G4733 Obstructive sleep apnea (adult) (pediatric): Secondary | ICD-10-CM

## 2020-02-16 NOTE — Chronic Care Management (AMB) (Signed)
Chronic Care Management   Follow Up Note   02/16/2020 Name: Dustin Cobb MRN: [id number] DOB: 1945-07-09  Primary Care Provider: [name], MD Reason for referral : Chronic Care Management  Dustin Cobb is a 74 y.o. year old male who is a primary care patient of [name], MD. He is currently engaged with the chronic care management team. A routine telephonic outreach was conducted today.  Review of [name] status, including review of consultants reports, relevant labs and test results was conducted today. Collaboration with appropriate care team members was performed as part of the comprehensive evaluation and provision of chronic care management services.    SDOH (Social Determinants of Health) assessments performed: No    Outpatient Encounter Medications as of 02/16/2020  Medication Sig Note   Cholecalciferol (VITAMIN D3) 1000 UNITS CAPS Take 1 capsule by mouth daily. 01/07/2015: Received from: [place]: Take by mouth.   famotidine (PEPCID) 40 MG tablet TAKE 1 TABLET BY MOUTH 2 TIMES DAILY.    ferrous sulfate 325 (65 FE) MG tablet Take 1 tablet by mouth daily. 01/07/2015: Received from: [place]: Take by mouth.   fluticasone (FLONASE) 50 MCG/ACT nasal spray Place 2 sprays into both nostrils daily.    loratadine (CLARITIN) 10 MG tablet Take 1 tablet (10 mg total) by mouth daily.    omeprazole (PRILOSEC) 40 MG capsule Take 1 capsule (40 mg total) by mouth daily.    rosuvastatin (CRESTOR) 20 MG tablet Take 1 tablet (20 mg total) by mouth daily.    triamcinolone cream (KENALOG) 0.1 % Apply 1 application topically 2 (two) times daily.    No facility-administered encounter medications on file as of 02/16/2020.     Goals Addressed            This Visit's Progress    Chronic Disease Management       CARE PLAN ENTRY (see longitudinal plan of care for additional care plan  information)  Current Barriers:   Chronic Disease Management support and education needs related to Hypertension and Hyperlipidemia.  Case Manager Clinical Goal(s):  Over the next 120 days, patient will:  Continue attending medical appointments as scheduled and take medications as prescribed.  Continue monitoring blood pressure and report readings outside of established range.  Continue engaging in non-strenuous activity as tolerated and adhere to recommended safety measures to prevent falls and injuries. Over the next 60 days, patient will:  Contact/update care management team if health status or care needs change.-Complete   Interventions:   Inter-disciplinary care team collaboration (see longitudinal plan of care)  Discussed plans for ongoing care management. Reports continued compliance with medications and treatment recommendations. Continues to ambulate without difficulty. No changes in activity tolerance. Reports not using his CPAP regularly due to the device not functioning properly. Recalls receiving the CPAP several years ago and says he will likely need a new machine. He has not contacted the medical supply agency but is agreeable to me contacting the agency to determine if a new orders is needed to replace the device. Overall feels that he is doing well. Denies urgent concerns or changes in care management needs. We will touch base again within the next few weeks to ensure he has the needed CPAP and supplies.    Patient Self Care Activities:   Self administers medications  Attends  scheduled provider appointments  Calls pharmacy for medication refills  Performs ADL's independently  Performs IADL's independently  Calls provider office for new concerns or questions   Please see past updates related to this goal by clicking on the "Past Updates" button in the selected goal         PLAN A member of the care management team will follow-up with [name] within  the next few weeks.    [facility] Care [facility] [phone]

## 2020-02-17 NOTE — Progress Notes (Signed)
Name: Dustin Cobb   MRN: [id number]    DOB: Feb 07, 1946   Date:02/18/2020       Progress Note  Subjective  Chief Complaint  Chief Complaint  Patient presents with  . Follow-up  . Hypertension    HPI  Hyperlipidemia: taking Rosuvastatin now, , no side effects, no chest pain or myalgias,reviewed labs done in 01/2019 and LDL was in the 80's, we will recheck labs today   Abdominal aorta aneurysm: found on screen [place] in 2020 , size was 3.1 cm and is due for recheck in 2023, he is taking statin therapy , he is not on aspirin because he has bleeding hemorrhoids, history of anemia and GERD   History of HTN: no longer on medication, bp today is towards low end of normal, no dizzinessWe stopped medication in  2017 because bp has been at goal, since retirement.  Hyperglycemia: hgbA1C has been stable at 5.8 % No polyphagia, polydipsia or polyuria. He continues to eat a balanced, he still has desserts but not daily   Low TSH: seen by Dr. Gabriel Carina, [facility] to normal, but has been suppressed again twice.We will recheck labs today, discussed need for bone density. He denies palpitation, no significant weight loss or change in bowel movements   GERD: under control with medication, still not following a diet,he continues todrink sodas and sweet tea daily, he drinks it with meals only   OSA: he has been wearing CPAP machine almost every night. . He wakes up feeling restedmost days. He states no leakage , pressure seems okay for him , he states sometimes has restless sleep, advised to try Melatonin   Obesity: discussed trying to go down to below 200 lbs. He is more aware of portion sizes, he is eating more vegetables. Needs to drink more water and stop drinking sodas and sweet tea  AR: on nasal steroid and claritin, still has intermittent symptoms. Worse during Fall and Spring   Diet: he does not have a high calcium diet, he is taking otc vitamin D, he is eats lean meat, eating tree  nuts Exercise: active around the house , no structure activity   Patient Active Problem List   Diagnosis Date Noted  . Aneurysm of abdominal aorta (HCC) 09/19/2018  . Lichen simplex chronicus 02/13/2018  . Rash of hands 08/10/2016  . Hypertension, essential, benign 01/07/2015  . GERD (gastroesophageal reflux disease) 01/07/2015  . OSA on CPAP 01/07/2015  . Low TSH level 01/07/2015  . Hyperglycemia 01/07/2015  . Obesity (BMI 30.0-34.9) 01/07/2015  . Dyslipidemia 01/07/2015  . History of hemorrhoidectomy 01/07/2015  . History of iron deficiency anemia 01/07/2015  . Allergic rhinitis 01/07/2015  . Metabolic syndrome 42/87/6811    Past Surgical History:  Procedure Laterality Date  . HEMORRHOID BANDING  04/01/12   Dr. Fleet Contras    Family History  Problem Relation Age of Onset  . Cancer Mother        Thyroid  . Emphysema Father   . COPD Father   . Hypertension Sister   . Hypertension Brother   . Heart disease Brother   . Healthy Daughter     Social History   Tobacco Use  . Smoking status: Former Smoker    Packs/day: 1.00    Years: 20.00    Pack years: 20.00    Types: Cigarettes    Start date: 01/07/1975    Quit date: 01/07/1995    Years since quitting: 25.1  . Smokeless tobacco: Never Used  . Tobacco  comment: smoking cessation materials not required  Substance Use Topics  . Alcohol use: No    Alcohol/week: 0.0 standard drinks     Current Outpatient Medications:  .  Cholecalciferol (VITAMIN D3) 1000 UNITS CAPS, Take 1 capsule by mouth daily., Disp: , Rfl:  .  famotidine (PEPCID) 40 MG tablet, TAKE 1 TABLET BY MOUTH 2 TIMES DAILY., Disp: 180 tablet, Rfl: 1 .  ferrous sulfate 325 (65 FE) MG tablet, Take 1 tablet by mouth daily., Disp: , Rfl:  .  fluticasone (FLONASE) 50 MCG/ACT nasal spray, Place 2 sprays into both nostrils daily., Disp: 16 g, Rfl: 5 .  loratadine (CLARITIN) 10 MG tablet, Take 1 tablet (10 mg total) by mouth daily., Disp: 30 tablet, Rfl: 5 .   omeprazole (PRILOSEC) 40 MG capsule, Take 1 capsule (40 mg total) by mouth daily., Disp: 90 capsule, Rfl: 1 .  rosuvastatin (CRESTOR) 20 MG tablet, Take 1 tablet (20 mg total) by mouth daily., Disp: 90 tablet, Rfl: 1 .  triamcinolone cream (KENALOG) 0.1 %, Apply 1 application topically 2 (two) times daily., Disp: 80 g, Rfl: 0  No Known Allergies  I personally reviewed active problem list, medication list, allergies, family history, social history, health maintenance with the patient/caregiver today.   ROS  Constitutional: Negative for fever or weight change.  Respiratory: Negative for cough and shortness of breath.   Cardiovascular: Negative for chest pain or palpitations.  Gastrointestinal: Negative for abdominal pain, no bowel changes.  Musculoskeletal: Negative for gait problem or joint swelling.  Skin: Negative for rash.  Neurological: Negative for dizziness or headache.  No other specific complaints in a complete review of systems (except as listed in HPI above).  Objective  Vitals:   02/18/20 0826  BP: 90/86  Pulse: 69  Resp: 18  Temp: 97.8 F (36.6 C)  TempSrc: Oral  SpO2: 96%  Weight: 205 lb 9.6 oz (93.3 kg)  Height: 5\' 8"  (1.727 m)    Body mass index is 31.26 kg/m.  Physical Exam  Constitutional: Patient appears well-developed and well-nourished. Obese  No distress.  HEENT: head atraumatic, normocephalic, pupils equal and reactive to light,  neck supple Cardiovascular: Normal rate, regular rhythm and normal heart sounds.  No murmur heard. No BLE edema. Pulmonary/Chest: Effort normal and breath sounds normal. No respiratory distress. Abdominal: Soft.  There is no tenderness. Psychiatric: Patient has a normal mood and affect. behavior is normal. Judgment and thought content normal.  PHQ2/9: Depression screen [facility] 2/9 02/18/2020 11/19/2019 08/18/2019 08/14/2019 02/12/2019  Decreased Interest 0 0 0 0 0  Down, Depressed, Hopeless 0 0 0 0 0  PHQ - 2 Score 0 0 0 0 0   Altered sleeping - - 0 - 0  Tired, decreased energy - - 0 - 0  Change in appetite - - 0 - 0  Feeling bad or failure about yourself  - - 0 - 0  Trouble concentrating - - 0 - 0  Moving slowly or fidgety/restless - - 0 - 0  Suicidal thoughts - - 0 - 0  PHQ-9 Score - - 0 - 0  Difficult doing work/chores - - Not difficult at all - -  Some recent data might be hidden    phq 9 is negative   Fall Risk: Fall Risk  02/18/2020 11/19/2019 08/18/2019 08/14/2019 02/12/2019  Falls in the past year? 0 0 0 0 0  Number falls in past yr: 0 - 0 0 0  Injury with Fall? 0 - 0 0  0  Risk for fall due to : - - - No Fall Risks -  Follow up - - - Falls prevention discussed -     Functional Status Survey: Is the patient deaf or have difficulty hearing?: No Does the patient have difficulty seeing, even when wearing glasses/contacts?: No Does the patient have difficulty concentrating, remembering, or making decisions?: No Does the patient have difficulty walking or climbing stairs?: No Does the patient have difficulty dressing or bathing?: No Does the patient have difficulty doing errands alone such as visiting a doctor's office or shopping?: No    Assessment & Plan  1. Abdominal aortic aneurysm (AAA) without rupture (HCC)  - rosuvastatin (CRESTOR) 20 MG tablet; Take 1 tablet (20 mg total) by mouth daily.  Dispense: 90 tablet; Refill: 1  2. Dyslipidemia  - rosuvastatin (CRESTOR) 20 MG tablet; Take 1 tablet (20 mg total) by mouth daily.  Dispense: 90 tablet; Refill: 1 - Lipid panel  3. Gastroesophageal reflux disease without esophagitis  - omeprazole (PRILOSEC) 40 MG capsule; Take 1 capsule (40 mg total) by mouth daily.  Dispense: 90 capsule; Refill: 1  4. Low TSH level  - TSH  5. OSA on CPAP  Continue supplementation   6. Hyperglycemia  - COMPLETE METABOLIC PANEL WITH GFR - Hemoglobin A1c  7. Vitamin D deficiency  On vitamin D supplementation   8. History of iron deficiency  anemia  - CBC with Differential/Platelet - Iron, TIBC and Ferritin Panel  9. Metabolic syndrome   10. Perennial allergic rhinitis with seasonal variation   11. Hypertension, essential, benign  - COMPLETE METABOLIC PANEL WITH GFR

## 2020-02-18 ENCOUNTER — Encounter: Payer: Self-pay | Admitting: Family Medicine

## 2020-02-18 ENCOUNTER — Other Ambulatory Visit: Payer: Self-pay | Admitting: Family Medicine

## 2020-02-18 ENCOUNTER — Ambulatory Visit (INDEPENDENT_AMBULATORY_CARE_PROVIDER_SITE_OTHER): Payer: PPO | Admitting: Family Medicine

## 2020-02-18 ENCOUNTER — Other Ambulatory Visit: Payer: Self-pay

## 2020-02-18 VITALS — BP 90/86 | HR 69 | Temp 97.8°F | Resp 18 | Ht 68.0 in | Wt 205.6 lb

## 2020-02-18 DIAGNOSIS — E8881 Metabolic syndrome: Secondary | ICD-10-CM | POA: Diagnosis not present

## 2020-02-18 DIAGNOSIS — G4733 Obstructive sleep apnea (adult) (pediatric): Secondary | ICD-10-CM

## 2020-02-18 DIAGNOSIS — I714 Abdominal aortic aneurysm, without rupture, unspecified: Secondary | ICD-10-CM

## 2020-02-18 DIAGNOSIS — Z862 Personal history of diseases of the blood and blood-forming organs and certain disorders involving the immune mechanism: Secondary | ICD-10-CM | POA: Diagnosis not present

## 2020-02-18 DIAGNOSIS — K219 Gastro-esophageal reflux disease without esophagitis: Secondary | ICD-10-CM | POA: Diagnosis not present

## 2020-02-18 DIAGNOSIS — E559 Vitamin D deficiency, unspecified: Secondary | ICD-10-CM

## 2020-02-18 DIAGNOSIS — J302 Other seasonal allergic rhinitis: Secondary | ICD-10-CM

## 2020-02-18 DIAGNOSIS — R739 Hyperglycemia, unspecified: Secondary | ICD-10-CM

## 2020-02-18 DIAGNOSIS — J3089 Other allergic rhinitis: Secondary | ICD-10-CM | POA: Diagnosis not present

## 2020-02-18 DIAGNOSIS — E785 Hyperlipidemia, unspecified: Secondary | ICD-10-CM

## 2020-02-18 DIAGNOSIS — I10 Essential (primary) hypertension: Secondary | ICD-10-CM | POA: Diagnosis not present

## 2020-02-18 DIAGNOSIS — R7989 Other specified abnormal findings of blood chemistry: Secondary | ICD-10-CM | POA: Diagnosis not present

## 2020-02-18 DIAGNOSIS — Z9989 Dependence on other enabling machines and devices: Secondary | ICD-10-CM | POA: Diagnosis not present

## 2020-02-18 MED ORDER — ROSUVASTATIN CALCIUM 20 MG PO TABS: 20.0000 mg | ORAL_TABLET | Freq: Every day | ORAL | 1 refills | Status: DC

## 2020-02-18 MED ORDER — MELATONIN 3 MG PO TABS: 3.0000 mg | ORAL_TABLET | Freq: Every day | ORAL | 0 refills | Status: DC

## 2020-02-18 MED ORDER — OMEPRAZOLE 40 MG PO CPDR: 40.0000 mg | DELAYED_RELEASE_CAPSULE | Freq: Every day | ORAL | 1 refills | Status: DC

## 2020-02-19 LAB — CBC WITH DIFFERENTIAL/PLATELET
Absolute Monocytes: 525 cells/uL (ref 200–950)
Basophils Absolute: 42 cells/uL (ref 0–200)
Basophils Relative: 0.8 %
Eosinophils Absolute: 286 cells/uL (ref 15–500)
Eosinophils Relative: 5.5 %
HCT: 41.2 % (ref 38.5–50.0)
Hemoglobin: 13.6 g/dL (ref 13.2–17.1)
Lymphs Abs: 2018 cells/uL (ref 850–3900)
MCH: 28.6 pg (ref 27.0–33.0)
MCHC: 33 g/dL (ref 32.0–36.0)
MCV: 86.7 fL (ref 80.0–100.0)
MPV: 9.7 fL (ref 7.5–12.5)
Monocytes Relative: 10.1 %
Neutro Abs: 2330 cells/uL (ref 1500–7800)
Neutrophils Relative %: 44.8 %
Platelets: 211 10*3/uL (ref 140–400)
RBC: 4.75 10*6/uL (ref 4.20–5.80)
RDW: 14 % (ref 11.0–15.0)
Total Lymphocyte: 38.8 %
WBC: 5.2 10*3/uL (ref 3.8–10.8)

## 2020-02-19 LAB — HEMOGLOBIN A1C
Hgb A1c MFr Bld: 5.8 % of total Hgb — ABNORMAL HIGH (ref <5.7)
Mean Plasma Glucose: 120 (calc)
eAG (mmol/L): 6.6 (calc)

## 2020-02-19 LAB — IRON,TIBC AND FERRITIN PANEL
%SAT: 32 % (calc) (ref 20–48)
Ferritin: 94 ng/mL (ref 24–380)
Iron: 108 ug/dL (ref 50–180)
TIBC: 339 mcg/dL (calc) (ref 250–425)

## 2020-02-19 LAB — COMPLETE METABOLIC PANEL WITH GFR
AG Ratio: 1.5 (calc) (ref 1.0–2.5)
ALT: 17 U/L (ref 9–46)
AST: 23 U/L (ref 10–35)
Albumin: 4 g/dL (ref 3.6–5.1)
Alkaline phosphatase (APISO): 96 U/L (ref 35–144)
BUN: 20 mg/dL (ref 7–25)
CO2: 24 mmol/L (ref 20–32)
Calcium: 9.3 mg/dL (ref 8.6–10.3)
Chloride: 108 mmol/L (ref 98–110)
Creat: 0.97 mg/dL (ref 0.70–1.18)
GFR, Est African American: 89 mL/min/{1.73_m2} (ref >=60)
GFR, Est Non African American: 77 mL/min/{1.73_m2} (ref >=60)
Globulin: 2.7 g/dL (calc) (ref 1.9–3.7)
Glucose, Bld: 84 mg/dL (ref 65–99)
Potassium: 4.4 mmol/L (ref 3.5–5.3)
Sodium: 138 mmol/L (ref 135–146)
Total Bilirubin: 0.5 mg/dL (ref 0.2–1.2)
Total Protein: 6.7 g/dL (ref 6.1–8.1)

## 2020-02-19 LAB — TSH: TSH: 0.3 mIU/L — ABNORMAL LOW (ref 0.40–4.50)

## 2020-02-19 LAB — LIPID PANEL
Cholesterol: 138 mg/dL (ref <200)
HDL: 59 mg/dL (ref >=40)
LDL Cholesterol (Calc): 66 mg/dL (calc)
Non-HDL Cholesterol (Calc): 79 mg/dL (calc) (ref <130)
Total CHOL/HDL Ratio: 2.3 (calc) (ref <5.0)
Triglycerides: 52 mg/dL (ref <150)

## 2020-02-24 NOTE — Patient Instructions (Signed)
Thank you for allowing the Chronic Care Management team to participate in your care.    Goals Addressed            This Visit's Progress   . Chronic Disease Management       CARE PLAN ENTRY (see longitudinal plan of care for additional care plan information)  Current Barriers:  . Chronic Disease Management support and education needs related to Hypertension and Hyperlipidemia.  Case Manager Clinical Goal(s):  Over the next 120 days, patient will: . Continue attending medical appointments as scheduled and take medications as prescribed. . Continue monitoring blood pressure and report readings outside of established range. . Continue engaging in non-strenuous activity as tolerated and adhere to recommended safety measures to prevent falls and injuries. Over the next 60 days, patient will: . Contact/update care management team if health status or care needs change.-Complete   Interventions:  . Inter-disciplinary care team collaboration (see longitudinal plan of care) . Discussed plans for ongoing care management. Reports continued compliance with medications and treatment recommendations. Continues to ambulate without difficulty. No changes in activity tolerance. Reports not using his CPAP regularly due to the device not functioning properly. Recalls receiving the CPAP several years ago and says he will likely need a new machine. He has not contacted the medical supply agency but is agreeable to me contacting the agency to determine if a new orders is needed to replace the device. Overall feels that he is doing well. Denies urgent concerns or changes in care management needs. We will touch base again within the next few weeks to ensure he has the needed CPAP and supplies.    Patient Self Care Activities:  . Self administers medications . Attends  scheduled provider appointments . Calls pharmacy for medication refills . Performs ADL's independently . Performs IADL's  independently . Calls provider office for new concerns or questions   Please see past updates related to this goal by clicking on the "Past Updates" button in the selected goal        Dustin Cobb verbalized understanding of the information discussed during the telephonic outreach today. Declined need for a mailed/printed copy of the instructions.   A member of the care management team will follow-up with Dustin Cobb within the next few weeks.    [facility] [phone]

## 2020-02-25 ENCOUNTER — Ambulatory Visit: Payer: Self-pay

## 2020-02-26 NOTE — Chronic Care Management (AMB) (Signed)
  Chronic Care Management   Note  02/26/2020 Name: Dustin Cobb MRN: [id number] DOB: December 10, 1945    Care Coordination Only: CPAP Supplies Unable to verify the company supplying [name]'s CPAP supplies. Pending call back from [name] to confirm.    [facility] [phone]

## 2020-03-22 ENCOUNTER — Ambulatory Visit: Payer: Self-pay

## 2020-03-22 NOTE — Chronic Care Management (AMB) (Signed)
  Chronic Care Management   Note  03/22/2020 Name: KINCAID TIGER MRN: [id number] DOB: 07-17-1945   Care Coordination Only: Mr. Lemme confirmed CPAP equipment was received from [place]. Reports air leaks around the tubing and mask. Will contact the sleep center to determine if a new order is required prior to him receiving the needed supplies.   Follow up plan: Will contact [place] and follow-up with Mr. Dhawan within the next few weeks.    [facility] [phone]

## 2020-04-09 ENCOUNTER — Encounter (INDEPENDENT_AMBULATORY_CARE_PROVIDER_SITE_OTHER): Payer: Self-pay

## 2020-04-16 ENCOUNTER — Ambulatory Visit: Payer: PPO

## 2020-05-04 ENCOUNTER — Ambulatory Visit: Payer: Self-pay | Attending: Internal Medicine

## 2020-05-04 DIAGNOSIS — Z23 Encounter for immunization: Secondary | ICD-10-CM

## 2020-05-04 NOTE — Progress Notes (Signed)
   Covid-19 Vaccination Clinic  Name:  Dustin Cobb    MRN: [id number] DOB: April 09, 1946  05/04/2020  [name] was observed post Covid-19 immunization for 15 minutes without incident. He was provided with Vaccine Information Sheet and instruction to access the V-Safe system.   [name] was instructed to call 911 with any severe reactions post vaccine: Marland Kitchen Difficulty breathing  . Swelling of face and throat  . A fast heartbeat  . A bad rash all over body  . Dizziness and weakness   Immunizations Administered    No immunizations on file.

## 2020-05-05 ENCOUNTER — Other Ambulatory Visit: Payer: Self-pay | Admitting: Internal Medicine

## 2020-06-09 ENCOUNTER — Telehealth: Payer: Self-pay

## 2020-06-09 ENCOUNTER — Ambulatory Visit: Payer: Self-pay

## 2020-06-09 DIAGNOSIS — G4733 Obstructive sleep apnea (adult) (pediatric): Secondary | ICD-10-CM

## 2020-06-09 DIAGNOSIS — K219 Gastro-esophageal reflux disease without esophagitis: Secondary | ICD-10-CM

## 2020-06-09 DIAGNOSIS — I10 Essential (primary) hypertension: Secondary | ICD-10-CM

## 2020-06-09 NOTE — Chronic Care Management (AMB) (Signed)
Chronic Care Management   Follow Up Note   06/09/2020 Name: Dustin Cobb MRN: [id number] DOB: 02-20-1946  Primary Care Provider: [name], MD Reason for referral : Chronic Care Management  Dustin Cobb is a 75 y.o. year old male who is a primary care patient of [name], MD. A routine telephonic outreach was conducted today. He has met his care management goals.  Review of Dustin Cobb status, including review of consultants reports, relevant labs and test results was conducted today. Collaboration with appropriate care team members was performed as part of the comprehensive evaluation and provision of chronic care management services.    SDOH (Social Determinants of Health) assessments performed: No   Outpatient Encounter Medications as of 06/09/2020  Medication Sig Note  . Cholecalciferol (VITAMIN D3) 1000 UNITS CAPS Take 1 capsule by mouth daily. 01/07/2015: Received from: [place]: Take by mouth.  . famotidine (PEPCID) 40 MG tablet TAKE 1 TABLET BY MOUTH 2 TIMES DAILY.   . ferrous sulfate 325 (65 FE) MG tablet Take 1 tablet by mouth daily. 01/07/2015: Received from: [place]: Take by mouth.  . fexofenadine (ALLEGRA) 180 MG tablet Take 180 mg by mouth daily.   . fluticasone (FLONASE) 50 MCG/ACT nasal spray Place 2 sprays into both nostrils daily.   . melatonin 3 MG TABS tablet Take 1 tablet (3 mg total) by mouth at bedtime.   Marland Kitchen omeprazole (PRILOSEC) 40 MG capsule Take 1 capsule (40 mg total) by mouth daily.   . rosuvastatin (CRESTOR) 20 MG tablet Take 1 tablet (20 mg total) by mouth daily.   Marland Kitchen triamcinolone cream (KENALOG) 0.1 % Apply 1 application topically 2 (two) times daily.    No facility-administered encounter medications on file as of 06/09/2020.     Objective:  Goals Addressed            This Visit's Progress   . COMPLETED: Chronic Disease Management       CARE PLAN ENTRY (see  longitudinal plan of care for additional care plan information)  Current Barriers:  . Chronic Disease Management support and education needs related to Hypertension and Hyperlipidemia.  Case Manager Clinical Goal(s):  Over the next 120 days, patient will: . Continue attending medical appointments as scheduled and take medications as prescribed. . Continue monitoring blood pressure and report readings outside of established range. . Continue engaging in non-strenuous activity as tolerated and adhere to recommended safety measures to prevent falls and injuries. Over the next 60 days, patient will: . Contact/update care management team if health status or care needs change.-Complete   Interventions:  . Inter-disciplinary care team collaboration (see longitudinal plan of care) . Discussed care management needs. Dustin Cobb has met his care management goals. He remains compliant with medications and treatment recommendations. During our previous outreach we discussed concerns regarding his CPAP. [facility] was contacted. Per the center's staff, his equipment and tubing are likely outdated and will need to be replaced. He was informed of the need to follow up with the sleep center for evaluation. He will require an updated Certification for Medical Necessity prior to obtaining new equipment. He is aware that a sleep provider completes evaluations in the [place] office on Mondays and will contact the clinic to schedule an appointment. He reports doing well and denies urgent concerns or additional needs. He agreed to update Dr. Ancil Boozer or call if additional care management outreach is needed. Our team will  gladly assist.   Patient Self Care Activities:  . Self administers medications . Attends  scheduled provider appointments . Calls pharmacy for medication refills . Performs ADL's independently . Performs IADL's independently . Calls provider office for new concerns or  questions   Please see past updates related to this goal by clicking on the "Past Updates" button in the selected goal          PLAN Dustin Cobb has met his care management goals. He agreed to update Dr. Ancil Boozer or call if additional outreach is required. The care management team will gladly assist.    Dustin Cobb [facility] Care Management [facility] [phone]

## 2020-06-25 NOTE — Progress Notes (Signed)
[facility] [place], [place] [address]  Pulmonary Sleep Medicine   Office Visit Note  Patient Name: Dustin Cobb DOB: 1946-01-05 MRN [id number]    Chief Complaint: Obstructive Sleep Apnea visit  Brief History:  Dustin Cobb is seen today for initial consultation. It has been almost 5 years since his last visit. The patient has a 6  year history of sleep apnea. Patient is not using PAP nightly. He reports sinus issues but his water tank is full of mold The patient feels more rested after sleeping with PAP.  The patient reports benefiting from PAP use. Reported sleepiness is  improved and the Epworth Sleepiness Score is 13 out of 24. The patient does not intentionally take naps. The patient complains of the following: sinus.  The compliance download shows poor compliance with an average use time of 4.4 hours. The AHI is 2.7  The patient does not complain of limb movements disrupting sleep.  ROS  General: (-) fever, (-) chills, (-) night sweat Nose and Sinuses: (-) nasal stuffiness or itchiness, (-) postnasal drip, (-) nosebleeds, (-) sinus trouble. Mouth and Throat: (-) sore throat, (-) hoarseness. Neck: (-) swollen glands, (-) enlarged thyroid, (-) neck pain. Respiratory: - cough, - shortness of breath, - wheezing. Neurologic: - numbness, - tingling. Psychiatric: - anxiety, - depression   Current Medication: Outpatient Encounter Medications as of 06/28/2020  Medication Sig Note  . Cholecalciferol (VITAMIN D3) 1000 UNITS CAPS Take 1 capsule by mouth daily. 01/07/2015: Received from: [place]: Take by mouth.  . famotidine (PEPCID) 40 MG tablet TAKE 1 TABLET BY MOUTH 2 TIMES DAILY.   . ferrous sulfate 325 (65 FE) MG tablet Take 1 tablet by mouth daily. 01/07/2015: Received from: [place]: Take by mouth.  . fexofenadine (ALLEGRA) 180 MG tablet Take 180 mg by mouth daily.   . fluticasone  (FLONASE) 50 MCG/ACT nasal spray Place 2 sprays into both nostrils daily.   . melatonin 3 MG TABS tablet Take 1 tablet (3 mg total) by mouth at bedtime.   Marland Kitchen omeprazole (PRILOSEC) 40 MG capsule Take 1 capsule (40 mg total) by mouth daily.   . rosuvastatin (CRESTOR) 20 MG tablet Take 1 tablet (20 mg total) by mouth daily.   Marland Kitchen triamcinolone cream (KENALOG) 0.1 % Apply 1 application topically 2 (two) times daily.    No facility-administered encounter medications on file as of 06/28/2020.    Surgical History: Past Surgical History:  Procedure Laterality Date  . HEMORRHOID BANDING  04/01/12   Dr. Fleet Contras    Medical History: Past Medical History:  Diagnosis Date  . Aching headache   . Allergy   . ED (erectile dysfunction)   . GERD (gastroesophageal reflux disease)   . Hyperlipidemia   . Hypertension   . Insomnia   . Metabolic syndrome   . Onychomycosis   . OSA on CPAP   . Postural dizziness   . Rash   . Sleep apnea     Family History: Non contributory to the present illness  Social History: Social History   Socioeconomic History  . Marital status: Married    Spouse name: Mary  . Number of children: 2  . Years of education: Not on file  . Highest education level: Associate degree: occupational, Hotel manager, or vocational program  Occupational History  . Occupation: retired  Tobacco Use  . Smoking status: Former Smoker    Packs/day: 1.00    Years: 20.00  Pack years: 20.00    Types: Cigarettes    Start date: 01/07/1975    Quit date: 01/07/1995    Years since quitting: 25.4  . Smokeless tobacco: Never Used  . Tobacco comment: smoking cessation materials not required  Vaping Use  . Vaping Use: Never used  Substance and Sexual Activity  . Alcohol use: No    Alcohol/week: 0.0 standard drinks  . Drug use: No  . Sexual activity: Yes    Partners: Female    Birth control/protection: None  Other Topics Concern  . Not on file  Social History Narrative  . Not on file    Social Determinants of Health   Financial Resource Strain: Low Risk   . Difficulty of Paying Living Expenses: Not hard at all  Food Insecurity: No Food Insecurity  . Worried About Charity fundraiser in the Last Year: Never true  . Ran Out of Food in the Last Year: Never true  Transportation Needs: No Transportation Needs  . Lack of Transportation (Medical): No  . Lack of Transportation (Non-Medical): No  Physical Activity: Inactive  . Days of Exercise per Week: 0 days  . Minutes of Exercise per Session: 0 min  Stress: No Stress Concern Present  . Feeling of Stress : Not at all  Social Connections: Moderately Integrated  . Frequency of Communication with Friends and Family: More than three times a week  . Frequency of Social Gatherings with Friends and Family: More than three times a week  . Attends Religious Services: More than 4 times per year  . Active Member of Clubs or Organizations: No  . Attends Archivist Meetings: Never  . Marital Status: Married  Human resources officer Violence: Not At Risk  . Fear of Current or Ex-Partner: No  . Emotionally Abused: No  . Physically Abused: No  . Sexually Abused: No    Vital Signs: Blood pressure (!) 141/96, pulse 88, temperature 98.6 F (37 C), temperature source Temporal, resp. rate 20, height 5\' 8"  (1.727 m), weight 203 lb (92.1 kg), SpO2 95 %.  Examination: General Appearance: The patient is well-developed, well-nourished, and in no distress. Neck Circumference:  Skin: Gross inspection of skin unremarkable. Head: normocephalic, no gross deformities. Eyes: no gross deformities noted. ENT: ears appear grossly normal Neurologic: Alert and oriented. No involuntary movements.    EPWORTH SLEEPINESS SCALE:  Scale:  (0)= no chance of dozing; (1)= slight chance of dozing; (2)= moderate chance of dozing; (3)= high chance of dozing  Chance  Situtation    Sitting and reading: 3    Watching TV: 1    Sitting Inactive in  public: 3    As a passenger in car: 1      Lying down to rest: 3    Sitting and talking: 1    Sitting quielty after lunch: 1    In a car, stopped in traffic: 0   TOTAL SCORE:   13 out of 24    SLEEP STUDIES:  1. PSG 07/23/14 - AHI 10.6, REM AHI 42.7, Low SpO2 87%    CPAP COMPLIANCE DATA:  Date Range: 05/26/20 - 06/24/20  Average Daily Use: 2:52 hours  Median Use: 4:41  Compliance for > 4 Hours: 40%  AHI: 2.7 respiratory events per hour  Days Used: 19/30   Mask Leak: 40.3  95th Percentile Pressure: 17/12 cmH2O        LABS: No results found for this or any previous visit (from the past 2160 hour(s)).  Radiology: US AORTA MEDICARE SCREENING  Result Date: 09/19/2018 CLINICAL DATA:  History of tobacco use. Medicare screening for abdominal aortic aneurysm. EXAM: US ABDOMINAL AORTA MEDICARE SCREENING TECHNIQUE: Ultrasound examination of the abdominal aorta was performed as a screening evaluation for abdominal aortic aneurysm. COMPARISON:  None. FINDINGS: Abdominal aortic measurements as follows: Proximal:  3.1 cm Mid:  2.4 cm Distal:  2.2 cm IMPRESSION: 3.1 cm proximal abdominal aortic aneurysm. Recommend followup by ultrasound in 3 years. This recommendation follows ACR consensus guidelines: White Paper of the ACR Incidental Findings Committee II on Vascular Findings. J Am Coll Radiol 2013; 10:789-794. Electronically Signed   By: Marijo Conception M.D.   On: 09/19/2018 10:14    No results found.  No results found.    Assessment and Plan: Patient Active Problem List   Diagnosis Date Noted  . Aneurysm of abdominal aorta (HCC) 09/19/2018  . Lichen simplex chronicus 02/13/2018  . Rash of hands 08/10/2016  . Hypertension, essential, benign 01/07/2015  . GERD (gastroesophageal reflux disease) 01/07/2015  . OSA on CPAP 01/07/2015  . Low TSH level 01/07/2015  . Hyperglycemia 01/07/2015  . Obesity (BMI 30.0-34.9) 01/07/2015  . Dyslipidemia 01/07/2015  .  History of hemorrhoidectomy 01/07/2015  . History of iron deficiency anemia 01/07/2015  . Allergic rhinitis 01/07/2015  . Metabolic syndrome [id number]      The patient does tolerate PAP and reports significant benefit from PAP use. The patient was reminded how to clean the CPAP and advised to replace the water tank and replace supplies regularly. The patient was also counselled on the importance of regular exercise and a good diet. The compliance is poor. This was due to not having new supplies The apnea is controlled.   1. OSA- use PAP nightly and care for equipment as recommended. 2. CPAP couseling-Discussed importance of adequate CPAP use as well as proper care and cleaning techniques of machine and all supplies. 3. Obesity Counseling: Had a lengthy discussion regarding patients BMI and weight issues. Patient was instructed on portion control as well as increased activity. Also discussed caloric restrictions with trying to maintain intake less than 2000 Kcal. Discussions were made in accordance with the 5As of weight management. Simple actions such as not eating late and if able to, taking a walk is suggested. 4. HTN: Elevated BP in office today. Pt previously on medication however was taken off due to hypotension. Reports his BP is normally good. He will f/u with PCP. 5. GERD: Continue current medication.  General Counseling: I have discussed the findings of the evaluation and examination with Aasim.  I have also discussed any further diagnostic evaluation thatmay be needed or ordered today. Lenton verbalizes understanding of the findings of todays visit. We also reviewed his medications today and discussed drug interactions and side effects including but not limited excessive drowsiness and altered mental states. We also discussed that there is always a risk not just to him but also people around him. he has been encouraged to call the office with any questions or concerns that should arise  related to todays visit.  No orders of the defined types were placed in this encounter.       I have personally obtained a history, examined the patient, evaluated laboratory and imaging results, formulated the assessment and plan and placed orders.   Richelle Ito Saunders Glance, PhD, FAASM  Diplomate, American Board of Sleep Medicine    Allyne Gee, MD [facility] Diplomate ABMS Pulmonary and Critical Care Medicine Sleep medicine

## 2020-06-28 ENCOUNTER — Ambulatory Visit (INDEPENDENT_AMBULATORY_CARE_PROVIDER_SITE_OTHER): Payer: PPO | Admitting: Internal Medicine

## 2020-06-28 VITALS — BP 141/96 | HR 88 | Temp 98.6°F | Resp 20 | Ht 68.0 in | Wt 203.0 lb

## 2020-06-28 DIAGNOSIS — Z9989 Dependence on other enabling machines and devices: Secondary | ICD-10-CM | POA: Diagnosis not present

## 2020-06-28 DIAGNOSIS — Z7189 Other specified counseling: Secondary | ICD-10-CM

## 2020-06-28 DIAGNOSIS — G4733 Obstructive sleep apnea (adult) (pediatric): Secondary | ICD-10-CM

## 2020-06-28 DIAGNOSIS — I10 Essential (primary) hypertension: Secondary | ICD-10-CM | POA: Diagnosis not present

## 2020-06-28 DIAGNOSIS — K219 Gastro-esophageal reflux disease without esophagitis: Secondary | ICD-10-CM

## 2020-06-28 DIAGNOSIS — E669 Obesity, unspecified: Secondary | ICD-10-CM

## 2020-06-28 NOTE — Patient Instructions (Signed)

## 2020-07-21 NOTE — Patient Instructions (Signed)
Thank you for allowing the Chronic Care Management team to participate in your care.  Goals Addressed            This Visit's Progress   . COMPLETED: Chronic Disease Management       CARE PLAN ENTRY (see longitudinal plan of care for additional care plan information)  Current Barriers:  . Chronic Disease Management support and education needs related to Hypertension and Hyperlipidemia.  Case Manager Clinical Goal(s):  Over the next 120 days, patient will: . Continue attending medical appointments as scheduled and take medications as prescribed. . Continue monitoring blood pressure and report readings outside of established range. . Continue engaging in non-strenuous activity as tolerated and adhere to recommended safety measures to prevent falls and injuries. Over the next 60 days, patient will: . Contact/update care management team if health status or care needs change.-Complete   Interventions:  . Inter-disciplinary care team collaboration (see longitudinal plan of care) . Discussed care management needs. Dustin Cobb has met his care management goals. He remains compliant with medications and treatment recommendations. During our previous outreach we discussed concerns regarding his CPAP. [facility] was contacted. Per the center's staff, his equipment and tubing are likely outdated and will need to be replaced. He was informed of the need to follow up with the sleep center for evaluation. He will require an updated Certification for Medical Necessity prior to obtaining new equipment. He is aware that a sleep provider completes evaluations in the [place] office on Mondays and will contact the clinic to schedule an appointment. He reports doing well and denies urgent concerns or additional needs. He agreed to update Dr. Ancil Boozer or call if additional care management outreach is needed. Our team will gladly assist.   Patient Self Care Activities:  . Self administers  medications . Attends  scheduled provider appointments . Calls pharmacy for medication refills . Performs ADL's independently . Performs IADL's independently . Calls provider office for new concerns or questions   Please see past updates related to this goal by clicking on the "Past Updates" button in the selected goal            Dustin Cobb verbalized understanding of the information discussed during the telephonic outreach today. Declined need for mailed/printed instructions or resources. He has met his care management goals. He agreed to update Dr.Sowles or call if additional outreach is required. The care management team will gladly assist.    Dustin Cobb [facility] Care Management [facility] [phone]

## 2020-07-23 ENCOUNTER — Other Ambulatory Visit: Payer: Self-pay | Admitting: Family Medicine

## 2020-07-23 DIAGNOSIS — K219 Gastro-esophageal reflux disease without esophagitis: Secondary | ICD-10-CM

## 2020-08-01 NOTE — Telephone Encounter (Signed)
Duplicate Encounter: Please disregard.

## 2020-08-17 ENCOUNTER — Ambulatory Visit (INDEPENDENT_AMBULATORY_CARE_PROVIDER_SITE_OTHER): Payer: PPO

## 2020-08-17 VITALS — Ht 68.0 in | Wt 205.0 lb

## 2020-08-17 DIAGNOSIS — Z0000 Encounter for general adult medical examination without abnormal findings: Secondary | ICD-10-CM | POA: Diagnosis not present

## 2020-08-17 NOTE — Patient Instructions (Signed)
Dustin Cobb , Thank you for taking time to come for your Medicare Wellness Visit. I appreciate your ongoing commitment to your health goals. Please review the following plan we discussed and let me know if I can assist you in the future.   Screening recommendations/referrals: Colonoscopy: Done 12/28/2010 - Repeat 12/2020 Recommended yearly ophthalmology/optometry visit for glaucoma screening and checkup Recommended yearly dental visit for hygiene and checkup  Vaccinations: Influenza vaccine: Done 02/18/2020 Pneumococcal vaccine: Done 01/07/2015 Tdap vaccine: Done 01/29/2009 Shingles vaccine: Shingrix discussed. Please contact your pharmacy for coverage information.    Covid-19: Complete 07/21/19, 08/19/19, & 05/04/2020  Advanced directives: Advance directive discussed with you today. I have provided a copy for you to complete at home and have notarized. Once this is complete please bring a copy in to our office so we can scan it into your chart.  Conditions/risks identified: Stay active - be sure to get 30 minutes of physical activity each day.  Next appointment: Follow up in one year for your annual wellness visit.   Preventive Care 74 Years and Older, Male Preventive care refers to lifestyle choices and visits with your health care provider that can promote health and wellness. What does preventive care include?  A yearly physical exam. This is also called an annual well check.  Dental exams once or twice a year.  Routine eye exams. Ask your health care provider how often you should have your eyes checked.  Personal lifestyle choices, including:  Daily care of your teeth and gums.  Regular physical activity.  Eating a healthy diet.  Avoiding tobacco and drug use.  Limiting alcohol use.  Practicing safe sex.  Taking low doses of aspirin every day.  Taking vitamin and mineral supplements as recommended by your health care provider. What happens during an annual well  check? The services and screenings done by your health care provider during your annual well check will depend on your age, overall health, lifestyle risk factors, and family history of disease. Counseling  Your health care provider may ask you questions about your:  Alcohol use.  Tobacco use.  Drug use.  Emotional well-being.  Home and relationship well-being.  Sexual activity.  Eating habits.  History of falls.  Memory and ability to understand (cognition).  Work and work Statistician. Screening  You may have the following tests or measurements:  Height, weight, and BMI.  Blood pressure.  Lipid and cholesterol levels. These may be checked every 5 years, or more frequently if you are over 74 years old.  Skin check.  Lung cancer screening. You may have this screening every year starting at age 74 if you have a 30-pack-year history of smoking and currently smoke or have quit within the past 15 years.  Fecal occult blood test (FOBT) of the stool. You may have this test every year starting at age 74.  Flexible sigmoidoscopy or colonoscopy. You may have a sigmoidoscopy every 5 years or a colonoscopy every 10 years starting at age 74.  Prostate cancer screening. Recommendations will vary depending on your family history and other risks.  Hepatitis C blood test.  Hepatitis B blood test.  Sexually transmitted disease (STD) testing.  Diabetes screening. This is done by checking your blood sugar (glucose) after you have not eaten for a while (fasting). You may have this done every 1-3 years.  Abdominal aortic aneurysm (AAA) screening. You may need this if you are a current or former smoker.  Osteoporosis. You may be screened starting at  age 74 if you are at high risk. Talk with your health care provider about your test results, treatment options, and if necessary, the need for more tests. Vaccines  Your health care provider may recommend certain vaccines, such  as:  Influenza vaccine. This is recommended every year.  Tetanus, diphtheria, and acellular pertussis (Tdap, Td) vaccine. You may need a Td booster every 10 years.  Zoster vaccine. You may need this after age 74.  Pneumococcal 13-valent conjugate (PCV13) vaccine. One dose is recommended after age 65.  Pneumococcal polysaccharide (PPSV23) vaccine. One dose is recommended after age 74. Talk to your health care provider about which screenings and vaccines you need and how often you need them. This information is not intended to replace advice given to you by your health care provider. Make sure you discuss any questions you have with your health care provider. Document Released: 06/11/2015 Document Revised: 02/02/2016 Document Reviewed: 03/16/2015 Elsevier Interactive Patient Education  2017 [place] Prevention in the Home Falls can cause injuries. They can happen to people of all ages. There are many things you can do to make your home safe and to help prevent falls. What can I do on the outside of my home?  Regularly fix the edges of walkways and driveways and fix any cracks.  Remove anything that might make you trip as you walk through a door, such as a raised step or threshold.  Trim any bushes or trees on the path to your home.  Use bright outdoor lighting.  Clear any walking paths of anything that might make someone trip, such as rocks or tools.  Regularly check to see if handrails are loose or broken. Make sure that both sides of any steps have handrails.  Any raised decks and porches should have guardrails on the edges.  Have any leaves, snow, or ice cleared regularly.  Use sand or salt on walking paths during winter.  Clean up any spills in your garage right away. This includes oil or grease spills. What can I do in the bathroom?  Use night lights.  Install grab bars by the toilet and in the tub and shower. Do not use towel bars as grab bars.  Use  non-skid mats or decals in the tub or shower.  If you need to sit down in the shower, use a plastic, non-slip stool.  Keep the floor dry. Clean up any water that spills on the floor as soon as it happens.  Remove soap buildup in the tub or shower regularly.  Attach bath mats securely with double-sided non-slip rug tape.  Do not have throw rugs and other things on the floor that can make you trip. What can I do in the bedroom?  Use night lights.  Make sure that you have a light by your bed that is easy to reach.  Do not use any sheets or blankets that are too big for your bed. They should not hang down onto the floor.  Have a firm chair that has side arms. You can use this for support while you get dressed.  Do not have throw rugs and other things on the floor that can make you trip. What can I do in the kitchen?  Clean up any spills right away.  Avoid walking on wet floors.  Keep items that you use a lot in easy-to-reach places.  If you need to reach something above you, use a strong step stool that has a grab bar.  Keep electrical cords out of the way.  Do not use floor polish or wax that makes floors slippery. If you must use wax, use non-skid floor wax.  Do not have throw rugs and other things on the floor that can make you trip. What can I do with my stairs?  Do not leave any items on the stairs.  Make sure that there are handrails on both sides of the stairs and use them. Fix handrails that are broken or loose. Make sure that handrails are as long as the stairways.  Check any carpeting to make sure that it is firmly attached to the stairs. Fix any carpet that is loose or worn.  Avoid having throw rugs at the top or bottom of the stairs. If you do have throw rugs, attach them to the floor with carpet tape.  Make sure that you have a light switch at the top of the stairs and the bottom of the stairs. If you do not have them, ask someone to add them for you. What  else can I do to help prevent falls?  Wear shoes that:  Do not have high heels.  Have rubber bottoms.  Are comfortable and fit you well.  Are closed at the toe. Do not wear sandals.  If you use a stepladder:  Make sure that it is fully opened. Do not climb a closed stepladder.  Make sure that both sides of the stepladder are locked into place.  Ask someone to hold it for you, if possible.  Clearly mark and make sure that you can see:  Any grab bars or handrails.  First and last steps.  Where the edge of each step is.  Use tools that help you move around (mobility aids) if they are needed. These include:  Canes.  Walkers.  Scooters.  Crutches.  Turn on the lights when you go into a dark area. Replace any light bulbs as soon as they burn out.  Set up your furniture so you have a clear path. Avoid moving your furniture around.  If any of your floors are uneven, fix them.  If there are any pets around you, be aware of where they are.  Review your medicines with your doctor. Some medicines can make you feel dizzy. This can increase your chance of falling. Ask your doctor what other things that you can do to help prevent falls. This information is not intended to replace advice given to you by your health care provider. Make sure you discuss any questions you have with your health care provider. Document Released: 03/11/2009 Document Revised: 10/21/2015 Document Reviewed: 06/19/2014 Elsevier Interactive Patient Education  2017 [employer].

## 2020-08-17 NOTE — Progress Notes (Signed)
Subjective:   Dustin Cobb is a 75 y.o. male who presents for Medicare Annual/Subsequent preventive examination.  Virtual Visit via Video Note  I connected with  [name] on 08/17/20 at  1:30 PM EDT via telehealth video enabled device and verified that I am speaking with the correct person using two identifiers.  Location: Patient: Home Provider: [place] Persons participating in the virtual visit: patient/Nurse Health Advisor   I discussed the limitations, risks, security and privacy concerns of performing an evaluation and management service by telephone and the availability of in person appointments. The patient expressed understanding and agreed to proceed.  Some vital signs may be absent or patient reported.   Clemetine Marker, LPN   Review of Systems     Cardiac Risk Factors include: advanced age (>21men, >67 women);dyslipidemia     Objective:    Today's Vitals   08/17/20 1331  Weight: 205 lb (93 kg)  Height: 5\' 8"  (1.727 m)   Body mass index is 31.17 kg/m.  Advanced Directives 08/17/2020 08/14/2019 08/13/2018 08/07/2017 02/08/2017 08/10/2016 01/19/2016  Does Patient Have a Medical Advance Directive? No No No No No No No  Would patient like information on creating a medical advance directive? Yes (MAU/Ambulatory/Procedural Areas - Information given) No - Patient declined Yes (MAU/Ambulatory/Procedural Areas - Information given) Yes (MAU/Ambulatory/Procedural Areas - Information given) - - No - patient declined information    Current Medications (verified) Outpatient Encounter Medications as of 08/17/2020  Medication Sig  . Cholecalciferol (VITAMIN D3) 1000 UNITS CAPS Take 1 capsule by mouth daily.  . famotidine (PEPCID) 40 MG tablet TAKE 1 TABLET BY MOUTH 2 TIMES DAILY.  . fexofenadine (ALLEGRA) 180 MG tablet Take 180 mg by mouth daily.  . fluticasone (FLONASE) 50 MCG/ACT nasal spray Place 2 sprays into both nostrils daily.  Marland Kitchen omeprazole (PRILOSEC) 40 MG capsule  Take 1 capsule (40 mg total) by mouth daily.  . rosuvastatin (CRESTOR) 20 MG tablet Take 1 tablet (20 mg total) by mouth daily.  . ferrous sulfate 325 (65 FE) MG tablet Take 1 tablet by mouth daily.  . melatonin 3 MG TABS tablet Take 1 tablet (3 mg total) by mouth at bedtime. (Patient not taking: Reported on 08/17/2020)  . triamcinolone cream (KENALOG) 0.1 % Apply 1 application topically 2 (two) times daily. (Patient not taking: Reported on 08/17/2020)   No facility-administered encounter medications on file as of 08/17/2020.    Allergies (verified) Patient has no known allergies.   History: Past Medical History:  Diagnosis Date  . Aching headache   . Allergy   . ED (erectile dysfunction)   . GERD (gastroesophageal reflux disease)   . Hyperlipidemia   . Hypertension   . Insomnia   . Metabolic syndrome   . Onychomycosis   . OSA on CPAP   . Postural dizziness   . Rash   . Sleep apnea    Past Surgical History:  Procedure Laterality Date  . HEMORRHOID BANDING  04/01/12   Dr. Fleet Contras   Family History  Problem Relation Age of Onset  . Cancer Mother        Thyroid  . Emphysema Father   . COPD Father   . Hypertension Sister   . Hypertension Brother   . Heart disease Brother   . Healthy Daughter    Social History   Socioeconomic History  . Marital status: Married    Spouse name: Mary  . Number of children: 2  . Years of education: Not  on file  . Highest education level: Associate degree: occupational, Hotel manager, or vocational program  Occupational History  . Occupation: retired  Tobacco Use  . Smoking status: Former Smoker    Packs/day: 1.00    Years: 20.00    Pack years: 20.00    Types: Cigarettes    Start date: 01/07/1975    Quit date: 01/07/1995    Years since quitting: 25.6  . Smokeless tobacco: Never Used  . Tobacco comment: smoking cessation materials not required  Vaping Use  . Vaping Use: Never used  Substance and Sexual Activity  . Alcohol use: No     Alcohol/week: 0.0 standard drinks  . Drug use: No  . Sexual activity: Yes    Partners: Female    Birth control/protection: None  Other Topics Concern  . Not on file  Social History Narrative   ** Merged History Encounter **       Social Determinants of Health   Financial Resource Strain: Low Risk   . Difficulty of Paying Living Expenses: Not hard at all  Food Insecurity: No Food Insecurity  . Worried About Charity fundraiser in the Last Year: Never true  . Ran Out of Food in the Last Year: Never true  Transportation Needs: No Transportation Needs  . Lack of Transportation (Medical): No  . Lack of Transportation (Non-Medical): No  Physical Activity: Insufficiently Active  . Days of Exercise per Week: 3 days  . Minutes of Exercise per Session: 20 min  Stress: No Stress Concern Present  . Feeling of Stress : Not at all  Social Connections: Moderately Integrated  . Frequency of Communication with Friends and Family: Once a week  . Frequency of Social Gatherings with Friends and Family: Once a week  . Attends Religious Services: More than 4 times per year  . Active Member of Clubs or Organizations: Yes  . Attends Archivist Meetings: More than 4 times per year  . Marital Status: Married    Tobacco Counseling Counseling given: Not Answered Comment: smoking cessation materials not required   Clinical Intake:  Pre-visit preparation completed: Yes  Pain : No/denies pain     BMI - recorded: 31.17 Nutritional Status: BMI > 30  Obese Nutritional Risks: None Diabetes: No  How often do you need to have someone help you when you read instructions, pamphlets, or other written materials from your doctor or pharmacy?: 1 - Never   Interpreter Needed?: No  Information entered by :: Amy Hopkins, LPN   Activities of Daily Living In your present state of health, do you have any difficulty performing the following activities: 08/17/2020 02/18/2020  Hearing? N N   Vision? N N  Difficulty concentrating or making decisions? N N  Walking or climbing stairs? N N  Dressing or bathing? N N  Doing errands, shopping? N N  Preparing Food and eating ? N -  Using the Toilet? N -  In the past six months, have you accidently leaked urine? N -  Do you have problems with loss of bowel control? N -  Managing your Medications? N -  Managing your Finances? N -  Housekeeping or managing your Housekeeping? N -  Some recent data might be hidden    Patient Care Team: [name], MD as PCP - General (Family Medicine)  Indicate any recent Medical Services you may have received from other than [facility] providers in the past year (date may be approximate).     Assessment:  This is a routine wellness examination for [place].  Hearing/Vision screen  Hearing Screening   125Hz  250Hz  500Hz  1000Hz  2000Hz  3000Hz  4000Hz  6000Hz  8000Hz   Right ear:           Left ear:           Comments: Pt denies hearing difficulty, but wife has concerns - has appt Monday to have this checked out  Vision Screening Comments: Annual vision screenings with Dr. Ellin Mayhew - uses reading glasses only  Dietary issues and exercise activities discussed: Current Exercise Habits: Home exercise routine, Time (Minutes): 20, Frequency (Times/Week): 3, Weekly Exercise (Minutes/Week): 60, Intensity: Mild, Exercise limited by: None identified  Goals    . DIET - INCREASE WATER INTAKE     Recommend to drink at least 6-8 8oz glasses of water per day.    . Increase physical activity     Recommend increasing physical activity to at least 3 days per week       Depression Screen [facility] 2/9 Scores 08/17/2020 02/18/2020 11/19/2019 08/18/2019 08/14/2019 02/12/2019 08/16/2018  PHQ - 2 Score 0 0 0 0 0 0 0  PHQ- 9 Score - - - 0 - 0 0    Fall Risk Fall Risk  08/17/2020 02/18/2020 11/19/2019 08/18/2019 08/14/2019  Falls in the past year? 0 0 0 0 0  Number falls in past yr: 0 0 - 0 0  Injury with Fall? 0 0 - 0 0  Risk  for fall due to : No Fall Risks - - - No Fall Risks  Follow up Falls prevention discussed - - - Falls prevention discussed    FALL RISK PREVENTION PERTAINING TO THE HOME:  Any stairs in or around the home? Yes  If so, are there any without handrails? No  Home free of loose throw rugs in walkways, pet beds, electrical cords, etc? Yes  Adequate lighting in your home to reduce risk of falls? Yes   ASSISTIVE DEVICES UTILIZED TO PREVENT FALLS:  Life alert? No  Use of a cane, walker or w/c? No  Grab bars in the bathroom? No  Shower chair or bench in shower? No  Elevated toilet seat or a handicapped toilet? No   TIMED UP AND GO:  Was the test performed? No  telephonic visit.   Cognitive Function:  Normal cognitive status assessed by direct observation by this Nurse Health Advisor. No abnormalities found.       6CIT Screen 08/13/2018 08/07/2017  What Year? 0 points 0 points  What month? 0 points 0 points  What time? 0 points 0 points  Count back from 20 0 points 0 points  Months in reverse 0 points 0 points  Repeat phrase 2 points 4 points  Total Score 2 4    Immunizations Immunization History  Administered Date(s) Administered  . Fluad Quad(high Dose 65+) 02/12/2019  . Influenza, High Dose Seasonal PF 04/11/2016, 03/05/2017, 02/13/2018  . Influenza,inj,Quad PF,6+ Mos 01/07/2015  . Influenza-Unspecified 03/02/2014, 02/18/2020  . Moderna SARS-COV2 Booster Vaccination 05/04/2020  . Moderna Sars-Covid-2 Vaccination 07/21/2019, 08/19/2019  . Pneumococcal Conjugate-13 01/07/2015  . Pneumococcal Polysaccharide-23 12/18/2013  . Tdap 01/29/2009  . Zoster 07/22/2015    TDAP status: Up to date  Flu Vaccine status: Up to date  Pneumococcal vaccine status: Up to date  Covid-19 vaccine status: Completed vaccines  Qualifies for Shingles Vaccine? Yes   Zostavax completed Yes   Shingrix Completed?: No.    Education has been provided regarding the importance of this vaccine.  Patient  has been advised to call insurance company to determine out of pocket expense if they have not yet received this vaccine. Advised may also receive vaccine at local pharmacy or Health Dept. Verbalized acceptance and understanding.  Screening Tests Health Maintenance  Topic Date Due  . TETANUS/TDAP  08/17/2020 (Originally 01/30/2019)  . COLONOSCOPY (Pts 45-67yrs Insurance coverage will need to be confirmed)  12/27/2020  . INFLUENZA VACCINE  Completed  . COVID-19 Vaccine  Completed  . Hepatitis C Screening  Completed  . PNA vac Low Risk Adult  Completed  . HPV VACCINES  Aged Out    Health Maintenance  There are no preventive care reminders to display for this patient.  Colorectal cancer screening: Type of screening: Colonoscopy. Completed 12/28/2010. Repeat every 10 years  Lung Cancer Screening: (Low Dose CT Chest recommended if Age 10-80 years, 30 pack-year currently smoking OR have quit w/in 15years.) does not qualify.   Additional Screening:  Hepatitis C Screening: does qualify; Completed 02/2014  Vision Screening: Recommended annual ophthalmology exams for early detection of glaucoma and other disorders of the eye. Is the patient up to date with their annual eye exam?  Yes  Who is the provider or what is the name of the office in which the patient attends annual eye exams? [facility]  Dental Screening: Recommended annual dental exams for proper oral hygiene  Community Resource Referral / Chronic Care Management: CRR required this visit?  No   CCM required this visit?  No      Plan:     I have personally reviewed and noted the following in the patient's chart:   . Medical and social history . Use of alcohol, tobacco or illicit drugs  . Current medications and supplements . Functional ability and status . Nutritional status . Physical activity . Advanced directives . List of other physicians . Hospitalizations, surgeries, and ER visits in previous 12  months . Vitals . Screenings to include cognitive, depression, and falls . Referrals and appointments  In addition, I have reviewed and discussed with patient certain preventive protocols, quality metrics, and best practice recommendations. A written personalized care plan for preventive services as well as general preventive health recommendations were provided to patient.     Clemetine Marker, LPN   [id number]   Nurse Notes: None

## 2020-08-20 NOTE — Patient Instructions (Addendum)
Check cost of shingrix at local pharmacy   Preventive Care 74 Years and Older, Male Preventive care refers to lifestyle choices and visits with your health care provider that can promote health and wellness. This includes:  A yearly physical exam. This is also called an annual wellness visit.  Regular dental and eye exams.  Immunizations.  Screening for certain conditions.  Healthy lifestyle choices, such as: ? Eating a healthy diet. ? Getting regular exercise. ? Not using drugs or products that contain nicotine and tobacco. ? Limiting alcohol use. What can I expect for my preventive care visit? Physical exam Your health care provider will check your:  Height and weight. These may be used to calculate your BMI (body mass index). BMI is a measurement that tells if you are at a healthy weight.  Heart rate and blood pressure.  Body temperature.  Skin for abnormal spots. Counseling Your health care provider may ask you questions about your:  Past medical problems.  Family's medical history.  Alcohol, tobacco, and drug use.  Emotional well-being.  Home life and relationship well-being.  Sexual activity.  Diet, exercise, and sleep habits.  History of falls.  Memory and ability to understand (cognition).  Work and work Statistician.  Access to firearms. What immunizations do I need? Vaccines are usually given at various ages, according to a schedule. Your health care provider will recommend vaccines for you based on your age, medical history, and lifestyle or other factors, such as travel or where you work.   What tests do I need? Blood tests  Lipid and cholesterol levels. These may be checked every 5 years, or more often depending on your overall health.  Hepatitis C test.  Hepatitis B test. Screening  Lung cancer screening. You may have this screening every year starting at age 85 if you have a 30-pack-year history of smoking and currently smoke or have quit  within the past 15 years.  Colorectal cancer screening. ? All adults should have this screening starting at age 28 and continuing until age 52. ? Your health care provider may recommend screening at age 7 if you are at increased risk. ? You will have tests every 1-10 years, depending on your results and the type of screening test.  Prostate cancer screening. Recommendations will vary depending on your family history and other risks.  Genital exam to check for testicular cancer or hernias.  Diabetes screening. ? This is done by checking your blood sugar (glucose) after you have not eaten for a while (fasting). ? You may have this done every 1-3 years.  Abdominal aortic aneurysm (AAA) screening. You may need this if you are a current or former smoker.  STD (sexually transmitted disease) testing, if you are at risk. Follow these instructions at home: Eating and drinking  Eat a diet that includes fresh fruits and vegetables, whole grains, lean protein, and low-fat dairy products. Limit your intake of foods with high amounts of sugar, saturated fats, and salt.  Take vitamin and mineral supplements as recommended by your health care provider.  Do not drink alcohol if your health care provider tells you not to drink.  If you drink alcohol: ? Limit how much you have to 0-2 drinks a day. ? Be aware of how much alcohol is in your drink. In the [place] one drink equals one 12 oz bottle of beer (355 mL), one 5 oz glass of wine (148 mL), or one 1 oz glass of hard liquor (44 mL).  Lifestyle  Take daily care of your teeth and gums. Brush your teeth every morning and night with fluoride toothpaste. Floss one time each day.  Stay active. Exercise for at least 30 minutes 5 or more days each week.  Do not use any products that contain nicotine or tobacco, such as cigarettes, e-cigarettes, and chewing tobacco. If you need help quitting, ask your health care provider.  Do not use drugs.  If you  are sexually active, practice safe sex. Use a condom or other form of protection to prevent STIs (sexually transmitted infections).  Talk with your health care provider about taking a low-dose aspirin or statin.  Find healthy ways to cope with stress, such as: ? Meditation, yoga, or listening to music. ? Journaling. ? Talking to a trusted person. ? Spending time with friends and family. Safety  Always wear your seat belt while driving or riding in a vehicle.  Do not drive: ? If you have been drinking alcohol. Do not ride with someone who has been drinking. ? When you are tired or distracted. ? While texting.  Wear a helmet and other protective equipment during sports activities.  If you have firearms in your house, make sure you follow all gun safety procedures. What's next?  Visit your health care provider once a year for an annual wellness visit.  Ask your health care provider how often you should have your eyes and teeth checked.  Stay up to date on all vaccines. This information is not intended to replace advice given to you by your health care provider. Make sure you discuss any questions you have with your health care provider. Document Revised: 02/11/2019 Document Reviewed: 05/09/2018 Elsevier Patient Education  2021 [employer].

## 2020-08-20 NOTE — Progress Notes (Signed)
Name: Dustin Cobb   MRN: [id number]    DOB: 04/08/1946   Date:08/23/2020       Progress Note  Subjective  Chief Complaint  Annual Exam   HPI  Patient presents for annual CPE   He was aerating his yard and got cut on left lower leg by a blade, it happened on 08/18/2020, he washed the area, didn't bleed much, but is still a wound in the area that is healing  IPSS Questionnaire (AUA-7): Over the past month.   1)  How often have you had a sensation of not emptying your bladder completely after you finish urinating?  1 - Less than 1 time in 5  2)  How often have you had to urinate again less than two hours after you finished urinating? 1 - Less than 1 time in 5  3)  How often have you found you stopped and started again several times when you urinated?  0 - Not at all  4) How difficult have you found it to postpone urination?  1 - Less than 1 time in 5  5) How often have you had a weak urinary stream?  0 - Not at all  6) How often have you had to push or strain to begin urination?  0 - Not at all  7) How many times did you most typically get up to urinate from the time you went to bed until the time you got up in the morning?  0 - None  Total score:  0-7 mildly symptomatic   8-19 moderately symptomatic   20-35 severely symptomatic     Diet: balanced  Exercise:   Depression: phq 9 is negative Depression screen [facility] 2/9 08/23/2020 08/17/2020 02/18/2020 11/19/2019 08/18/2019  Decreased Interest 0 0 0 0 0  Down, Depressed, Hopeless 0 0 0 0 0  PHQ - 2 Score 0 0 0 0 0  Altered sleeping 0 - - - 0  Tired, decreased energy 0 - - - 0  Change in appetite 0 - - - 0  Feeling bad or failure about yourself  0 - - - 0  Trouble concentrating 0 - - - 0  Moving slowly or fidgety/restless 0 - - - 0  Suicidal thoughts 0 - - - 0  PHQ-9 Score 0 - - - 0  Difficult doing work/chores - - - - Not difficult at all  Some recent data might be hidden    Hypertension:  BP Readings from Last 3 Encounters:   08/23/20 132/84  06/28/20 (!) 141/96  02/18/20 90/86    Obesity: Wt Readings from Last 3 Encounters:  08/23/20 206 lb (93.4 kg)  08/17/20 205 lb (93 kg)  06/28/20 203 lb (92.1 kg)   BMI Readings from Last 3 Encounters:  08/23/20 31.32 kg/m  08/17/20 31.17 kg/m  06/28/20 30.87 kg/m     Lipids:  Lab Results  Component Value Date   CHOL 138 02/18/2020   CHOL 158 02/12/2019   CHOL 175 02/13/2018   Lab Results  Component Value Date   HDL 59 02/18/2020   HDL 62 02/12/2019   HDL 62 02/13/2018   Lab Results  Component Value Date   LDLCALC 66 02/18/2020   LDLCALC 82 02/12/2019   LDLCALC 99 02/13/2018   Lab Results  Component Value Date   TRIG 52 02/18/2020   TRIG 56 02/12/2019   TRIG 56 02/13/2018   Lab Results  Component Value Date   CHOLHDL 2.3 02/18/2020  CHOLHDL 2.5 02/12/2019   CHOLHDL 2.8 02/13/2018   No results found for: LDLDIRECT Glucose:  Glucose  Date Value Ref Range Status  08/29/2012 97 65 - 99 mg/dL Final  07/28/2011 94 65 - 99 mg/dL Final   Glucose, Bld  Date Value Ref Range Status  02/18/2020 84 65 - 99 mg/dL Final    Comment:    .            Fasting reference interval .   02/12/2019 98 65 - 99 mg/dL Final    Comment:    .            Fasting reference interval .   02/13/2018 91 65 - 99 mg/dL Final    Comment:    .            Fasting reference interval .     [place] [facility] Visit from [facility]/18/2020 [facility]  AUDIT-C Score 0       Married STD testing and prevention (HIV/chl/gon/syphilis): not interested  Hep C: 03/02/2014  Skin cancer: Discussed monitoring for atypical lesions Colorectal cancer: due for repeat in 2023  Prostate cancer: no family history of prostate cancer and AUA is low score  Lab Results  Component Value Date   PSA Normal  10/17/2013     Lung cancer:   Low Dose CT Chest recommended if Age 85-80 years, 30 pack-year currently smoking OR have quit w/in 15years. Patient  does not qualify.   AAA: up to date  ECG: 08/16/2018  Vaccines:   Shingrix: 70-64 yo and ask insurance if covered when patient above 57 yo Pneumonia: 01/07/2015, 12/18/2013, educated and discussed with patient. Flu: 02/18/2020, educated and discussed with patient. Tdap today due to recent injury and wound on left lower leg   Advanced Care Planning: A voluntary discussion about advance care planning including the explanation and discussion of advance directives.  Discussed health care proxy and Living will, and the patient was able to identify a health care proxy as wife .  Patient Active Problem List   Diagnosis Date Noted  . Aneurysm of abdominal aorta (HCC) 09/19/2018  . Lichen simplex chronicus 02/13/2018  . Rash of hands 08/10/2016  . Hypertension, essential, benign 01/07/2015  . GERD (gastroesophageal reflux disease) 01/07/2015  . OSA on CPAP 01/07/2015  . Low TSH level 01/07/2015  . Hyperglycemia 01/07/2015  . Obesity (BMI 30.0-34.9) 01/07/2015  . Dyslipidemia 01/07/2015  . History of hemorrhoidectomy 01/07/2015  . History of iron deficiency anemia 01/07/2015  . Allergic rhinitis 01/07/2015  . Metabolic syndrome 70/26/3785    Past Surgical History:  Procedure Laterality Date  . HEMORRHOID BANDING  04/01/12   Dr. Fleet Contras    Family History  Problem Relation Age of Onset  . Cancer Mother        Thyroid  . Emphysema Father   . COPD Father   . Hypertension Sister   . Hypertension Brother   . Heart disease Brother   . Healthy Daughter     Social History   Socioeconomic History  . Marital status: Married    Spouse name: Mary  . Number of children: 2  . Years of education: Not on file  . Highest education level: Associate degree: occupational, Hotel manager, or vocational program  Occupational History  . Occupation: retired  Tobacco Use  . Smoking status: Former Smoker    Packs/day: 1.00    Years: 20.00    Pack years: 20.00    Types: Cigarettes  Start  date: 01/07/1975    Quit date: 01/07/1995    Years since quitting: 25.6  . Smokeless tobacco: Never Used  . Tobacco comment: smoking cessation materials not required  Vaping Use  . Vaping Use: Never used  Substance and Sexual Activity  . Alcohol use: No    Alcohol/week: 0.0 standard drinks  . Drug use: No  . Sexual activity: Yes    Partners: Female    Birth control/protection: None  Other Topics Concern  . Not on file  Social History Narrative   ** Merged History Encounter **       Social Determinants of Health   Financial Resource Strain: Low Risk   . Difficulty of Paying Living Expenses: Not hard at all  Food Insecurity: No Food Insecurity  . Worried About Charity fundraiser in the Last Year: Never true  . Ran Out of Food in the Last Year: Never true  Transportation Needs: No Transportation Needs  . Lack of Transportation (Medical): No  . Lack of Transportation (Non-Medical): No  Physical Activity: Insufficiently Active  . Days of Exercise per Week: 1 day  . Minutes of Exercise per Session: 20 min  Stress: No Stress Concern Present  . Feeling of Stress : Only a little  Social Connections: Moderately Integrated  . Frequency of Communication with Friends and Family: Once a week  . Frequency of Social Gatherings with Friends and Family: Once a week  . Attends Religious Services: More than 4 times per year  . Active Member of Clubs or Organizations: Yes  . Attends Archivist Meetings: 1 to 4 times per year  . Marital Status: Married  Human resources officer Violence: Not At Risk  . Fear of Current or Ex-Partner: No  . Emotionally Abused: No  . Physically Abused: No  . Sexually Abused: No     Current Outpatient Medications:  .  Cholecalciferol (VITAMIN D3) 1000 UNITS CAPS, Take 1 capsule by mouth daily., Disp: , Rfl:  .  famotidine (PEPCID) 40 MG tablet, TAKE 1 TABLET BY MOUTH 2 TIMES DAILY., Disp: 180 tablet, Rfl: 1 .  ferrous sulfate 325 (65 FE) MG tablet, Take 1  tablet by mouth daily., Disp: , Rfl:  .  fexofenadine (ALLEGRA) 180 MG tablet, Take 180 mg by mouth daily., Disp: , Rfl:  .  fluticasone (FLONASE) 50 MCG/ACT nasal spray, Place 2 sprays into both nostrils daily., Disp: 16 g, Rfl: 5 .  omeprazole (PRILOSEC) 40 MG capsule, Take 1 capsule (40 mg total) by mouth daily., Disp: 90 capsule, Rfl: 1 .  rosuvastatin (CRESTOR) 20 MG tablet, Take 1 tablet (20 mg total) by mouth daily., Disp: 90 tablet, Rfl: 1 .  melatonin 3 MG TABS tablet, Take 1 tablet (3 mg total) by mouth at bedtime. (Patient not taking: No sig reported), Disp: 30 tablet, Rfl: 0 .  triamcinolone cream (KENALOG) 0.1 %, Apply 1 application topically 2 (two) times daily. (Patient not taking: No sig reported), Disp: 80 g, Rfl: 0  No Known Allergies   ROS  Constitutional: Negative for fever or weight change.  Respiratory: Negative for cough and shortness of breath.   Cardiovascular: Negative for chest pain or palpitations.  Gastrointestinal: Negative for abdominal pain, no bowel changes.  Musculoskeletal: Negative for gait problem or joint swelling.  Skin: Negative for rash.  Neurological: Negative for dizziness or headache.  No other specific complaints in a complete review of systems (except as listed in HPI above).   Objective  Vitals:  08/23/20 0759  BP: 132/84  Pulse: 83  Resp: 16  Temp: 98.2 F (36.8 C)  TempSrc: Oral  SpO2: 97%  Weight: 206 lb (93.4 kg)  Height: 5\' 8"  (1.727 m)    Body mass index is 31.32 kg/m.  Physical Exam  Constitutional: Patient appears well-developed and well-nourished. No distress.  HENT: Head: Normocephalic and atraumatic. Ears: B TMs ok, no erythema or effusion; Nose: Not done. Mouth/Throat: not done  Eyes: Conjunctivae and EOM are normal. Pupils are equal, round, and reactive to light. No scleral icterus.  Neck: Normal range of motion. Neck supple. No JVD present. No thyromegaly present.  Cardiovascular: Normal rate, regular rhythm  and normal heart sounds.  No murmur heard. No BLE edema. Pulmonary/Chest: Effort normal and breath sounds normal. No respiratory distress. Abdominal: Soft. Bowel sounds are normal, no distension. There is no tenderness. no masses MALE GENITALIA: Normal descended testes bilaterally, no masses palpated, no hernias, no lesions, no discharge RECTAL: not done  Musculoskeletal: Normal range of motion, no joint effusions. No gross deformities Neurological: he is alert and oriented to person, place, and time. No cranial nerve deficit. Coordination, balance, strength, speech and gait are normal.  Skin: laceration that is healing on left lower leg, about 2 inches in size, both hands have some dry patches on dorsal aspect  Psychiatric: Patient has a normal mood and affect. behavior is normal. Judgment and thought content normal.   Fall Risk: Fall Risk  08/23/2020 08/17/2020 02/18/2020 11/19/2019 08/18/2019  Falls in the past year? 0 0 0 0 0  Number falls in past yr: 0 0 0 - 0  Injury with Fall? 0 0 0 - 0  Risk for fall due to : - No Fall Risks - - -  Follow up - Falls prevention discussed - - -     Functional Status Survey: Is the patient deaf or have difficulty hearing?: Yes Does the patient have difficulty seeing, even when wearing glasses/contacts?: No Does the patient have difficulty concentrating, remembering, or making decisions?: No Does the patient have difficulty walking or climbing stairs?: No Does the patient have difficulty dressing or bathing?: No Does the patient have difficulty doing errands alone such as visiting a doctor's office or shopping?: No    Assessment & Plan  1. Well adult exam   2. Wound of left lower extremity, initial encounter  - Tdap vaccine greater than or equal to 7yo IM  3. Need for Tdap vaccination  - Tdap vaccine greater than or equal to 7yo IM  4. Colon cancer screening  - Ambulatory referral to Gastroenterology  5. Hearing loss of left ear,  unspecified hearing loss type  Referral to ENT  -Prostate cancer screening and PSA options (with potential risks and benefits of testing vs not testing) were discussed along with recent recs/guidelines. -USPSTF grade A and B recommendations reviewed with patient; age-appropriate recommendations, preventive care, screening tests, etc discussed and encouraged; healthy living encouraged; see AVS for patient education given to patient -Discussed importance of 150 minutes of physical activity weekly, eat two servings of fish weekly, eat one serving of tree nuts ( cashews, pistachios, pecans, almonds.Marland Kitchen) every other day, eat 6 servings of fruit/vegetables daily and drink plenty of water and avoid sweet beverages.

## 2020-08-23 ENCOUNTER — Encounter: Payer: Self-pay | Admitting: Family Medicine

## 2020-08-23 ENCOUNTER — Other Ambulatory Visit: Payer: Self-pay

## 2020-08-23 ENCOUNTER — Ambulatory Visit (INDEPENDENT_AMBULATORY_CARE_PROVIDER_SITE_OTHER): Payer: PPO | Admitting: Family Medicine

## 2020-08-23 VITALS — BP 132/84 | HR 83 | Temp 98.2°F | Resp 16 | Ht 68.0 in | Wt 206.0 lb

## 2020-08-23 DIAGNOSIS — H9192 Unspecified hearing loss, left ear: Secondary | ICD-10-CM | POA: Diagnosis not present

## 2020-08-23 DIAGNOSIS — S81802A Unspecified open wound, left lower leg, initial encounter: Secondary | ICD-10-CM | POA: Diagnosis not present

## 2020-08-23 DIAGNOSIS — Z23 Encounter for immunization: Secondary | ICD-10-CM | POA: Diagnosis not present

## 2020-08-23 DIAGNOSIS — Z0000 Encounter for general adult medical examination without abnormal findings: Secondary | ICD-10-CM | POA: Diagnosis not present

## 2020-08-23 DIAGNOSIS — Z1211 Encounter for screening for malignant neoplasm of colon: Secondary | ICD-10-CM | POA: Diagnosis not present

## 2020-08-27 NOTE — Progress Notes (Signed)
[facility] [place], [place] [address]  Pulmonary Sleep Medicine   Office Visit Note  Patient Name: Dustin Cobb DOB: 02/04/1946 MRN [id number]    Chief Complaint: Obstructive Sleep Apnea visit  Brief History:  Dustin Cobb is seen today for follow up  The patient has a 6 year history of sleep apnea. Patient is using PAP nightly.  The patient feels more rested after sleeping with PAP but as long.  The patient reports benefiting from PAP use. Reported sleepiness is  improved and the Epworth Sleepiness Score is 8 out of 24. The patient does not take naps. The patient complains of the following: mask leak  The compliance download shows good compliance with an average use time of 5.9 hours. The AHI is 2.5  The patient does  complain restless leg symptoms.   ROS  General: (-) fever, (-) chills, (-) night sweat Nose and Sinuses: (-) nasal stuffiness or itchiness, (-) postnasal drip, (-) nosebleeds, (-) sinus trouble. Mouth and Throat: (-) sore throat, (-) hoarseness. Neck: (-) swollen glands, (-) enlarged thyroid, (-) neck pain. Respiratory: - cough, - shortness of breath, - wheezing. Neurologic: - numbness, - tingling. Psychiatric: - anxiety, - depression   Current Medication: Outpatient Encounter Medications as of 08/30/2020  Medication Sig  . Cholecalciferol (VITAMIN D3) 1000 UNITS CAPS Take 1 capsule by mouth daily.  Marland Kitchen COVID-19 mRNA vaccine, Moderna, 100 MCG/0.5ML injection USE AS DIRECTED  . famotidine (PEPCID) 40 MG tablet TAKE 1 TABLET BY MOUTH 2 TIMES DAILY.  . ferrous sulfate 325 (65 FE) MG tablet Take 1 tablet by mouth daily.  . fexofenadine (ALLEGRA) 180 MG tablet Take 180 mg by mouth daily.  . fluticasone (FLONASE) 50 MCG/ACT nasal spray Place 2 sprays into both nostrils daily.  . melatonin 3 MG TABS tablet Take 1 tablet (3 mg total) by mouth at bedtime. (Patient not taking: No sig reported)  . omeprazole (PRILOSEC) 40 MG capsule TAKE 1 CAPSULE  BY MOUTH DAILY.  . rosuvastatin (CRESTOR) 20 MG tablet TAKE 1 TABLET BY MOUTH DAILY.  Marland Kitchen triamcinolone cream (KENALOG) 0.1 % Apply 1 application topically 2 (two) times daily. (Patient not taking: No sig reported)  . [DISCONTINUED] famotidine (PEPCID) 40 MG tablet TAKE 1 TABLET BY MOUTH 2 TIMES DAILY.  . [DISCONTINUED] omeprazole (PRILOSEC) 40 MG capsule Take 1 capsule (40 mg total) by mouth daily.  . [DISCONTINUED] rosuvastatin (CRESTOR) 20 MG tablet Take 1 tablet (20 mg total) by mouth daily.   No facility-administered encounter medications on file as of 08/30/2020.    Surgical History: Past Surgical History:  Procedure Laterality Date  . HEMORRHOID BANDING  04/01/12   Dr. Fleet Contras    Medical History: Past Medical History:  Diagnosis Date  . Aching headache   . Allergy   . ED (erectile dysfunction)   . GERD (gastroesophageal reflux disease)   . Hyperlipidemia   . Hypertension   . Insomnia   . Metabolic syndrome   . Onychomycosis   . OSA on CPAP   . Postural dizziness   . Rash   . Sleep apnea     Family History: Non contributory to the present illness  Social History: Social History   Socioeconomic History  . Marital status: Married    Spouse name: Dustin Cobb  . Number of children: 2  . Years of education: Not on file  . Highest education level: Associate degree: occupational, Hotel manager, or vocational program  Occupational History  . Occupation: retired  Tobacco Use  .  Smoking status: Former Smoker    Packs/day: 1.00    Years: 20.00    Pack years: 20.00    Types: Cigarettes    Start date: 01/07/1975    Quit date: 01/07/1995    Years since quitting: 25.6  . Smokeless tobacco: Never Used  . Tobacco comment: smoking cessation materials not required  Vaping Use  . Vaping Use: Never used  Substance and Sexual Activity  . Alcohol use: No    Alcohol/week: 0.0 standard drinks  . Drug use: No  . Sexual activity: Yes    Partners: Female    Birth control/protection: None   Other Topics Concern  . Not on file  Social History Narrative   ** Merged History Encounter **       Social Determinants of Health   Financial Resource Strain: Low Risk   . Difficulty of Paying Living Expenses: Not hard at all  Food Insecurity: No Food Insecurity  . Worried About Charity fundraiser in the Last Year: Never true  . Ran Out of Food in the Last Year: Never true  Transportation Needs: No Transportation Needs  . Lack of Transportation (Medical): No  . Lack of Transportation (Non-Medical): No  Physical Activity: Insufficiently Active  . Days of Exercise per Week: 1 day  . Minutes of Exercise per Session: 20 min  Stress: No Stress Concern Present  . Feeling of Stress : Only a little  Social Connections: Moderately Integrated  . Frequency of Communication with Friends and Family: Once a week  . Frequency of Social Gatherings with Friends and Family: Once a week  . Attends Religious Services: More than 4 times per year  . Active Member of Clubs or Organizations: Yes  . Attends Archivist Meetings: 1 to 4 times per year  . Marital Status: Married  Human resources officer Violence: Not At Risk  . Fear of Current or Ex-Partner: No  . Emotionally Abused: No  . Physically Abused: No  . Sexually Abused: No    Vital Signs: Blood pressure (!) 130/92, pulse 97, temperature 98.8 F (37.1 C), temperature source Temporal, resp. rate 16, height 5\' 8"  (1.727 m), weight 206 lb (93.4 kg), SpO2 96 %.  Examination: General Appearance: The patient is well-developed, well-nourished, and in no distress. Neck Circumference: 44 Skin: Gross inspection of skin unremarkable. Head: normocephalic, no gross deformities. Eyes: no gross deformities noted. ENT: ears appear grossly normal Neurologic: Alert and oriented. No involuntary movements.    EPWORTH SLEEPINESS SCALE:  Scale:  (0)= no chance of dozing; (1)= slight chance of dozing; (2)= moderate chance of dozing; (3)= high  chance of dozing  Chance  Situtation    Sitting and reading: 3    Watching TV: 1    Sitting Inactive in public: 0    As a passenger in car: 0      Lying down to rest: 3    Sitting and talking: 0    Sitting quielty after lunch: 1    In a car, stopped in traffic: 0   TOTAL SCORE:   8 out of 24    SLEEP STUDIES:  1.  PSG 07/23/14 - AHI 10.6, REM AHI 42.7, Low SpO2 87%   CPAP COMPLIANCE DATA:  Date Range: 06/25/20 - 08/23/20  Average Daily Use: 5:27 hours  Median Use: 6:11 hours  Compliance for > 4 Hours: 83%  AHI: 2.5 respiratory events per hour  Days Used: 56/60 days  Mask Leak: 41.3 lpm  95th Percentile  Pressure: 17/12 cmH2O         LABS: No results found for this or any previous visit (from the past 2160 hour(s)).  Radiology: US AORTA MEDICARE SCREENING  Result Date: 09/19/2018 CLINICAL DATA:  History of tobacco use. Medicare screening for abdominal aortic aneurysm. EXAM: US ABDOMINAL AORTA MEDICARE SCREENING TECHNIQUE: Ultrasound examination of the abdominal aorta was performed as a screening evaluation for abdominal aortic aneurysm. COMPARISON:  None. FINDINGS: Abdominal aortic measurements as follows: Proximal:  3.1 cm Mid:  2.4 cm Distal:  2.2 cm IMPRESSION: 3.1 cm proximal abdominal aortic aneurysm. Recommend followup by ultrasound in 3 years. This recommendation follows ACR consensus guidelines: White Paper of the ACR Incidental Findings Committee II on Vascular Findings. J Am Coll Radiol 2013; 10:789-794. Electronically Signed   By: Marijo Conception M.D.   On: 09/19/2018 10:14    No results found.  No results found.    Assessment and Plan: Patient Active Problem List   Diagnosis Date Noted  . Aneurysm of abdominal aorta (HCC) 09/19/2018  . Lichen simplex chronicus 02/13/2018  . Rash of hands 08/10/2016  . Hypertension, essential, benign 01/07/2015  . GERD (gastroesophageal reflux disease) 01/07/2015  . OSA on CPAP 01/07/2015  . Low  TSH level 01/07/2015  . Hyperglycemia 01/07/2015  . Obesity (BMI 30.0-34.9) 01/07/2015  . Dyslipidemia 01/07/2015  . History of hemorrhoidectomy 01/07/2015  . History of iron deficiency anemia 01/07/2015  . Allergic rhinitis 01/07/2015  . Metabolic syndrome 92/42/6834   1. OSA on CPAP The patient does tolerate PAP and reports some benefit from PAP use. The patient was reminded how to clean his CPAP and advised to elevate his upper body in sleep for reflux. The patient was also counselled on the importance of weight loss. The compliance is good. The apnea is well controlled. He has occasional RLS but is on an iron supplement and denies being told that he kicks in his sleep. He is interested in looking into an oral appliance. He is to follow up with his dentist to discuss. He will need a repeat study if he gets the appliance.    2. CPAP use counseling CPAP Counseling: had a lengthy discussion with the patient regarding the importance of PAP therapy in management of the sleep apnea. Patient appears to understand the risk factor reduction and also understands the risks associated with untreated sleep apnea. Patient will try to make a good faith effort to remain compliant with therapy. Also instructed the patient on proper cleaning of the device including the water must be changed daily if possible and use of distilled water is preferred. Patient understands that the machine should be regularly cleaned with appropriate recommended cleaning solutions that do not damage the PAP machine for example given white vinegar and water rinses. Other methods such as ozone treatment may not be as good as these simple methods to achieve cleaning.  3. Hypertension, essential, benign Controlled without medication at this time. Encouraged to monitor closely given today's reading. Hypertension Counseling:   The following hypertensive lifestyle modification were recommended and discussed:  1. Limiting alcohol intake to  less than 1 oz/day of ethanol:(24 oz of beer or 8 oz of wine or 2 oz of 100-proof whiskey). 2. Take baby ASA 81 mg daily. 3. Importance of regular aerobic exercise and losing weight. 4. Reduce dietary saturated fat and cholesterol intake for overall cardiovascular health. 5. Maintaining adequate dietary potassium, calcium, and magnesium intake. 6. Regular monitoring of the blood pressure. 7. Reduce  sodium intake to less than 100 mmol/day (less than 2.3 gm of sodium or less than 6 gm of sodium choride)   4. Gastroesophageal reflux disease without esophagitis Discussed lifestyle measures for this- elevating head of bed, avoiding food triggers, timing of meals, etc.   5. Obesity (BMI 30.0-34.9) Obesity Counseling: Had a lengthy discussion regarding patients BMI and weight issues. Patient was instructed on portion control as well as increased activity. Also discussed caloric restrictions with trying to maintain intake less than 2000 Kcal. Discussions were made in accordance with the 5As of weight management. Simple actions such as not eating late and if able to, taking a walk is suggested.   General Counseling: I have discussed the findings of the evaluation and examination with Brydon.  I have also discussed any further diagnostic evaluation thatmay be needed or ordered today. Jahn verbalizes understanding of the findings of todays visit. We also reviewed his medications today and discussed drug interactions and side effects including but not limited excessive drowsiness and altered mental states. We also discussed that there is always a risk not just to him but also people around him. he has been encouraged to call the office with any questions or concerns that should arise related to todays visit.  No orders of the defined types were placed in this encounter.       I have personally obtained a history, examined the patient, evaluated laboratory and imaging results, formulated the assessment  and plan and placed orders.  This patient was seen today by Tressie Ellis, PA-C in collaboration with Dr. Devona Konig.  Richelle Ito Saunders Glance, PhD, FAASM  Diplomate, American Board of Sleep Medicine    Allyne Gee, MD [facility] Diplomate ABMS Pulmonary and Critical Care Medicine Sleep medicine

## 2020-08-30 ENCOUNTER — Ambulatory Visit (INDEPENDENT_AMBULATORY_CARE_PROVIDER_SITE_OTHER): Payer: PPO | Admitting: Internal Medicine

## 2020-08-30 VITALS — BP 130/92 | HR 97 | Temp 98.8°F | Resp 16 | Ht 68.0 in | Wt 206.0 lb

## 2020-08-30 DIAGNOSIS — I10 Essential (primary) hypertension: Secondary | ICD-10-CM

## 2020-08-30 DIAGNOSIS — G4733 Obstructive sleep apnea (adult) (pediatric): Secondary | ICD-10-CM

## 2020-08-30 DIAGNOSIS — Z9989 Dependence on other enabling machines and devices: Secondary | ICD-10-CM

## 2020-08-30 DIAGNOSIS — Z7189 Other specified counseling: Secondary | ICD-10-CM | POA: Diagnosis not present

## 2020-08-30 DIAGNOSIS — K219 Gastro-esophageal reflux disease without esophagitis: Secondary | ICD-10-CM | POA: Diagnosis not present

## 2020-08-30 DIAGNOSIS — Z6831 Body mass index (BMI) 31.0-31.9, adult: Secondary | ICD-10-CM | POA: Diagnosis not present

## 2020-08-30 DIAGNOSIS — E669 Obesity, unspecified: Secondary | ICD-10-CM

## 2020-08-30 NOTE — Patient Instructions (Signed)

## 2020-09-03 ENCOUNTER — Other Ambulatory Visit: Payer: Self-pay

## 2020-09-03 ENCOUNTER — Telehealth (INDEPENDENT_AMBULATORY_CARE_PROVIDER_SITE_OTHER): Payer: Self-pay | Admitting: Gastroenterology

## 2020-09-03 DIAGNOSIS — Z1211 Encounter for screening for malignant neoplasm of colon: Secondary | ICD-10-CM

## 2020-09-03 NOTE — Progress Notes (Signed)
Gastroenterology Pre-Procedure Review  Request Date: 12/03/20 Requesting Physician: Dr. Allen Norris  PATIENT REVIEW QUESTIONS: The patient responded to the following health history questions as indicated:    1. Are you having any GI issues? no 2. Do you have a personal history of Polyps? yes (Pt thinks he may have had polyps.  Did not see it noted in chart.) 2012 colonoscopy was noted as normal 3. Do you have a family history of Colon Cancer or Polyps? no 4. Diabetes Mellitus? no 5. Joint replacements in the past 12 months?no 6. Major health problems in the past 3 months?no 7. Any artificial heart valves, MVP, or defibrillator?no    MEDICATIONS & ALLERGIES:    Patient reports the following regarding taking any anticoagulation/antiplatelet therapy:   Plavix, Coumadin, Eliquis, Xarelto, Lovenox, Pradaxa, Brilinta, or Effient? no Aspirin? no  Patient confirms/reports the following medications:  Current Outpatient Medications  Medication Sig Dispense Refill  . Cholecalciferol (VITAMIN D3) 1000 UNITS CAPS Take 1 capsule by mouth daily.    Marland Kitchen COVID-19 mRNA vaccine, Moderna, 100 MCG/0.5ML injection USE AS DIRECTED .25 mL 0  . famotidine (PEPCID) 40 MG tablet TAKE 1 TABLET BY MOUTH 2 TIMES DAILY. 180 tablet 1  . ferrous sulfate 325 (65 FE) MG tablet Take 1 tablet by mouth daily.    . fexofenadine (ALLEGRA) 180 MG tablet Take 180 mg by mouth daily.    . fluticasone (FLONASE) 50 MCG/ACT nasal spray Place 2 sprays into both nostrils daily. 16 g 5  . omeprazole (PRILOSEC) 40 MG capsule TAKE 1 CAPSULE BY MOUTH DAILY. 90 capsule 1  . rosuvastatin (CRESTOR) 20 MG tablet TAKE 1 TABLET BY MOUTH DAILY. 90 tablet 1  . triamcinolone cream (KENALOG) 0.1 % Apply 1 application topically 2 (two) times daily. 80 g 0  . melatonin 3 MG TABS tablet Take 1 tablet (3 mg total) by mouth at bedtime. (Patient not taking: No sig reported) 30 tablet 0   No current facility-administered medications for this visit.     Patient confirms/reports the following allergies:  No Known Allergies  No orders of the defined types were placed in this encounter.   AUTHORIZATION INFORMATION Primary Insurance: 1D#: Group #:  Secondary Insurance: 1D#: Group #:  SCHEDULE INFORMATION: Date: 12/03/20 Time: Location:[facility]

## 2020-09-16 DIAGNOSIS — H903 Sensorineural hearing loss, bilateral: Secondary | ICD-10-CM | POA: Diagnosis not present

## 2020-09-17 ENCOUNTER — Other Ambulatory Visit (HOSPITAL_COMMUNITY): Payer: Self-pay | Admitting: Unknown Physician Specialty

## 2020-09-17 ENCOUNTER — Other Ambulatory Visit: Payer: Self-pay | Admitting: Unknown Physician Specialty

## 2020-09-17 DIAGNOSIS — H90A22 Sensorineural hearing loss, unilateral, left ear, with restricted hearing on the contralateral side: Secondary | ICD-10-CM

## 2020-09-28 ENCOUNTER — Other Ambulatory Visit: Payer: Self-pay

## 2020-09-28 ENCOUNTER — Ambulatory Visit
Admission: RE | Admit: 2020-09-28 | Discharge: 2020-09-28 | Disposition: A | Discharge status: Home / Self Care | Payer: PPO | Source: Ambulatory Visit | Attending: Unknown Physician Specialty | Admitting: Unknown Physician Specialty

## 2020-09-28 DIAGNOSIS — H90A22 Sensorineural hearing loss, unilateral, left ear, with restricted hearing on the contralateral side: Secondary | ICD-10-CM | POA: Insufficient documentation

## 2020-09-28 DIAGNOSIS — J019 Acute sinusitis, unspecified: Secondary | ICD-10-CM | POA: Diagnosis not present

## 2020-09-28 DIAGNOSIS — H9190 Unspecified hearing loss, unspecified ear: Secondary | ICD-10-CM | POA: Diagnosis not present

## 2020-09-28 DIAGNOSIS — H905 Unspecified sensorineural hearing loss: Secondary | ICD-10-CM | POA: Diagnosis not present

## 2020-09-28 IMAGING — MR MR BRAIN/IAC WO/W CM
10 of 13 series · 25 of 48 positions shown · IV contrast (gadavist)
Comparison: Head CT [DATE].

CLINICAL DATA: Sensorineural hearing loss, unilateral, left ear,
with restricted hearing on the contralateral side. Additional
history provided by scanning technologist: Patient reports gradual
left-sided hearing loss for 2 years; progressive; patient reports
things sound "muffled" on left side.

EXAM:
MRI HEAD WITHOUT AND WITH CONTRAST
TECHNIQUE: Multiplanar, multiecho pulse sequences of the brain and surrounding
structures were obtained without and with intravenous contrast.
CONTRAST:  9mL GADAVIST GADOBUTROL 1 MMOL/ML IV SOLN

[Series 5: T1 · sagittal · 5.0mm · 0.62mm/px · 1 of 25 slices shown (1 of 3)]
[im 1/25]
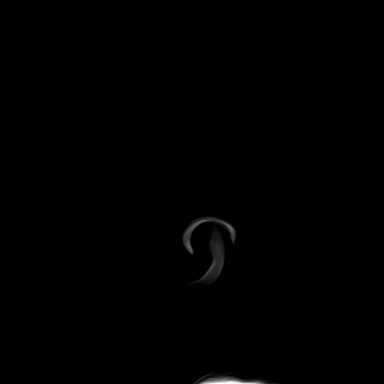

[Series 6: ax dwi_tracew · axial · 3.0mm · 0.60mm/px · z∈[-99,+55]mm · 4 of 48 slices shown]
[im 1/48]
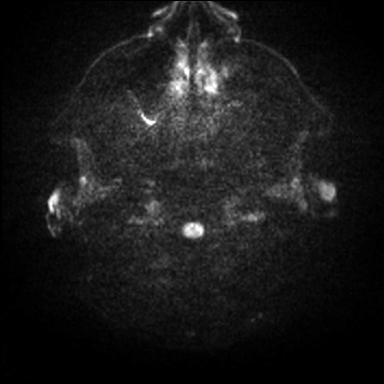
[im 16/48]
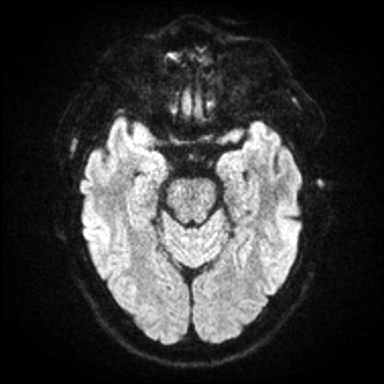
[im 32/48]
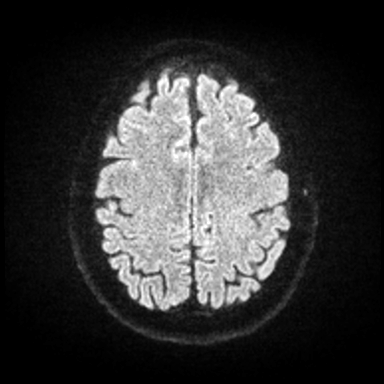
[im 48/48]
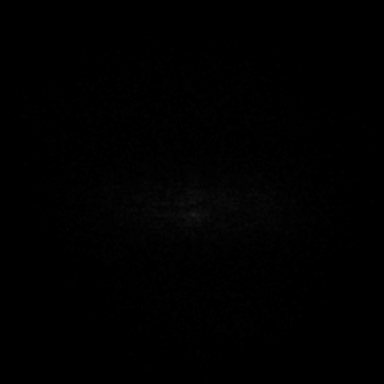

[Series 7: ax dwi_adc · axial · 3.0mm · 0.60mm/px · z∈[-99,-50]mm · 2 of 47 slices shown]
[im 1/47]
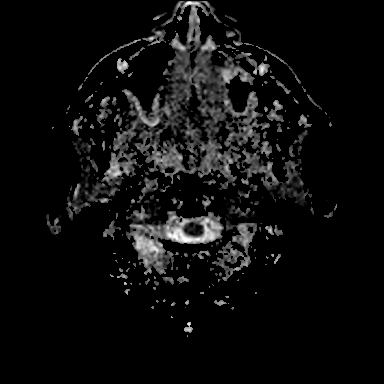
[im 16/47]
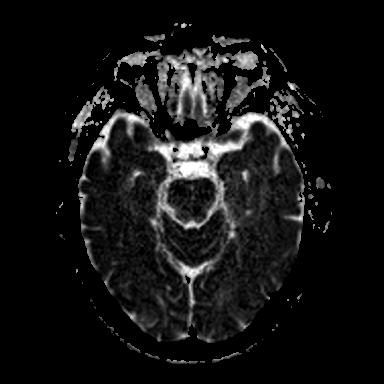

[Series 8: T2 · axial · 5.0mm · 0.53mm/px · z∈[-97,+52]mm · 2 of 26 slices shown]
[im 1/26]
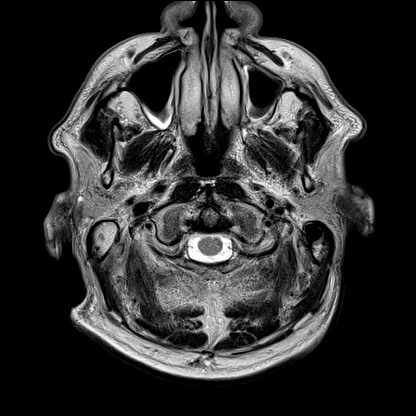
[im 26/26]
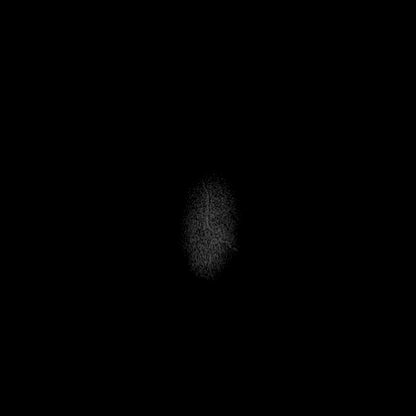

[Series 13: FLAIR · axial · 3.0mm · 0.53mm/px · z∈[-103,+58]mm · 4 of 55 slices shown]
[im 1/55]
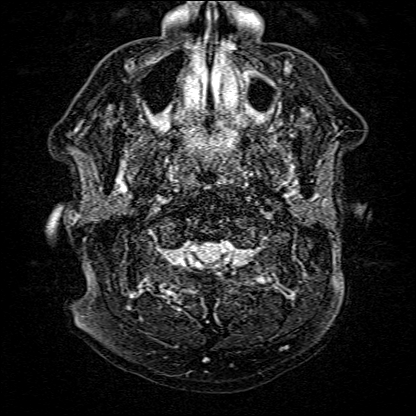
[im 19/55]
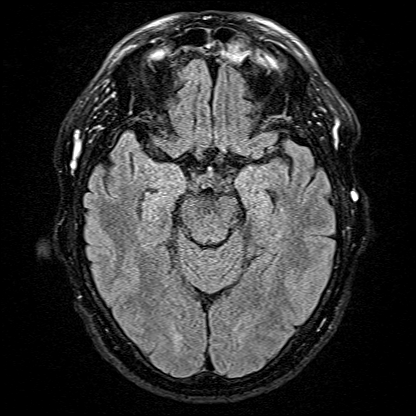
[im 37/55]
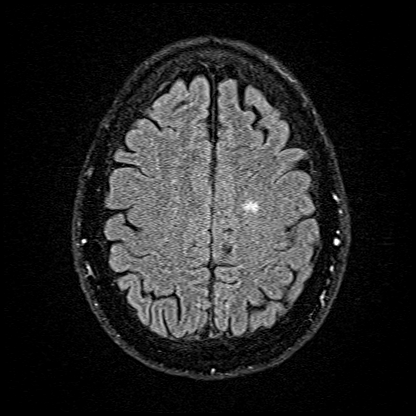
[im 55/55]
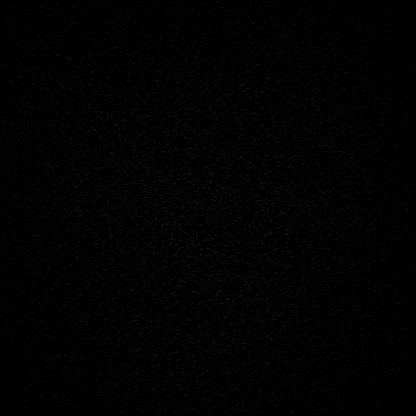

[Series 14: T1 · coronal · non-contrast · 3.0mm · 0.21mm/px · 1 of 13 slices shown (2 of 3)]
[im 1/13]
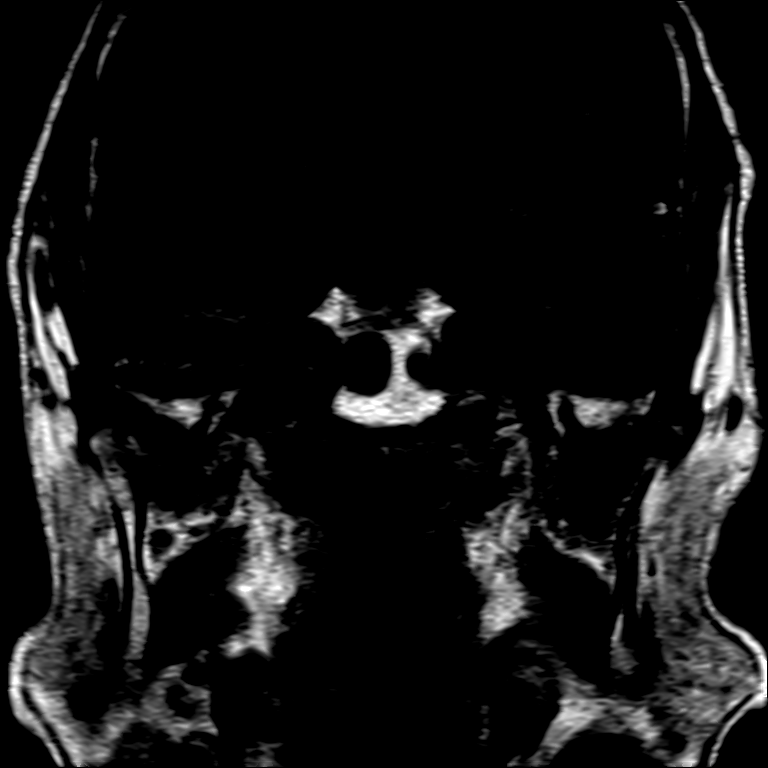

[Series 16: T1 · axial · non-contrast · 3.0mm · 0.21mm/px · 1 of 15 slices shown (3 of 3)]
[im 1/15]
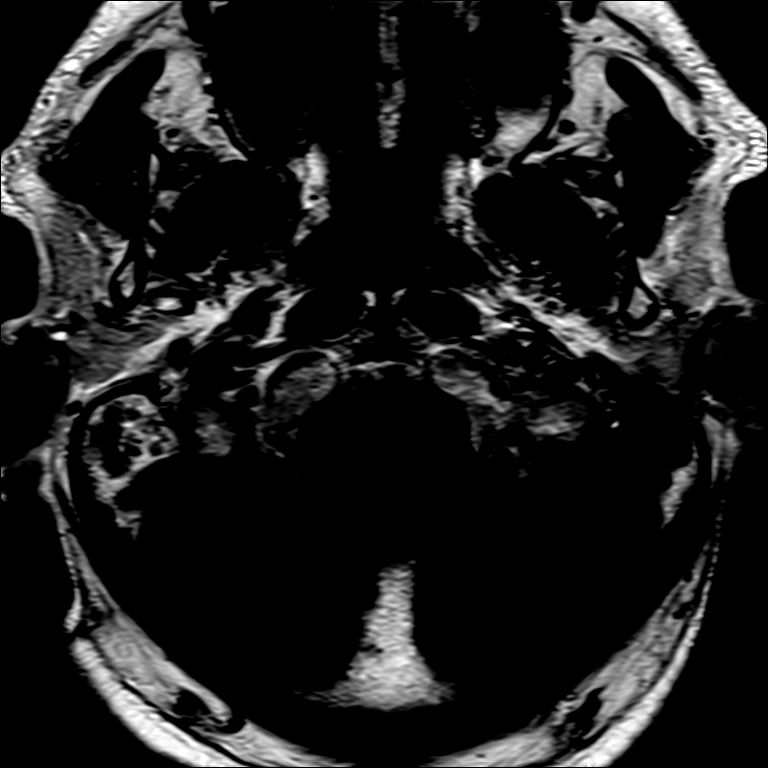

[Series 17: T1 post-contrast · axial · 3.0mm · 0.21mm/px · 1 of 15 slices shown (1 of 3)]
[im 1/15]
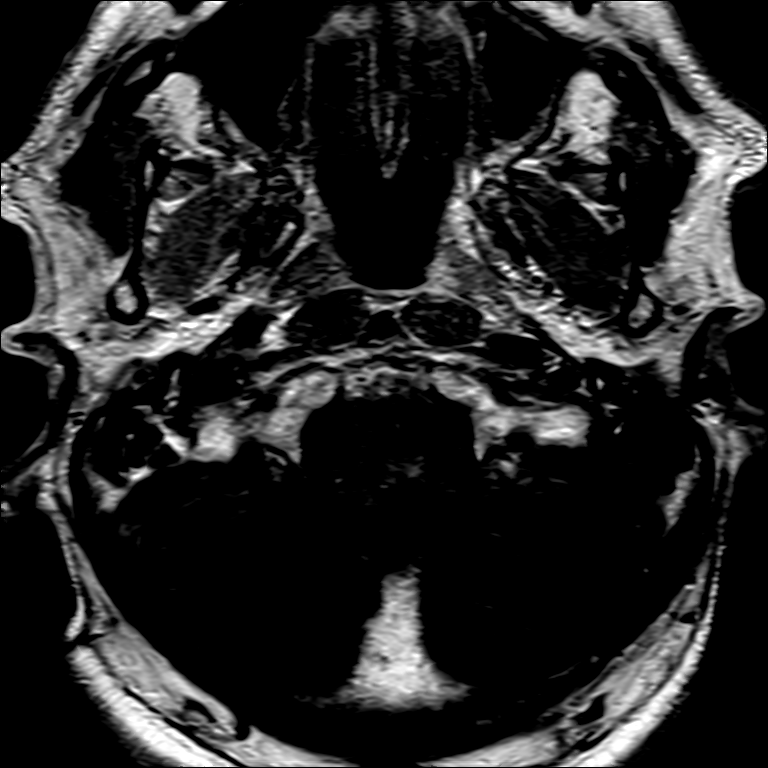

[Series 18: T1 post-contrast · coronal · 3.0mm · 0.21mm/px · 1 of 13 slices shown (2 of 3)]
[im 1/13]
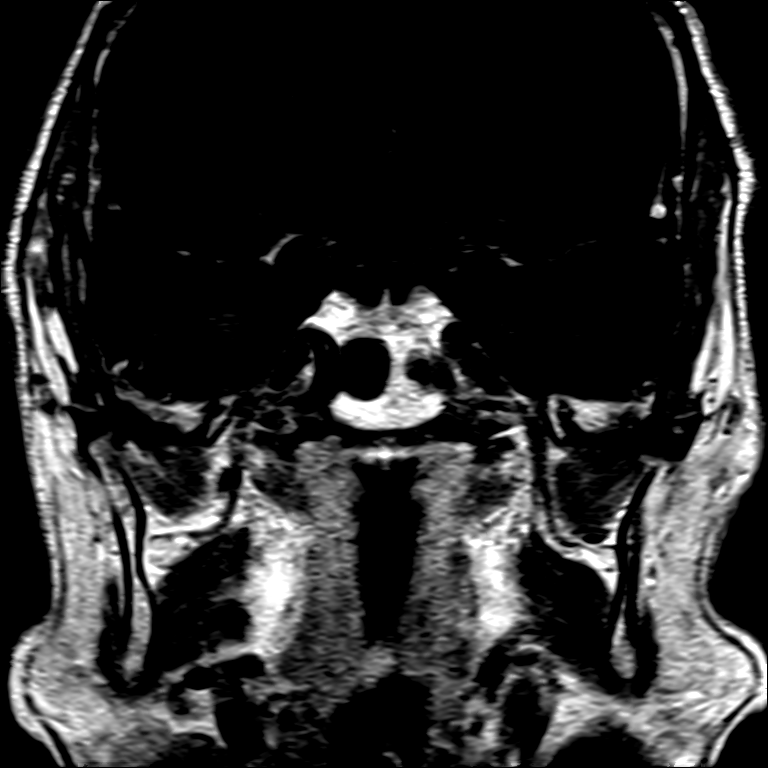

[Series 19: T1 post-contrast · axial · 1.0mm · 0.98mm/px · z∈[-109,+66]mm · 8 of 176 slices shown (3 of 3)]
[im 1/176]
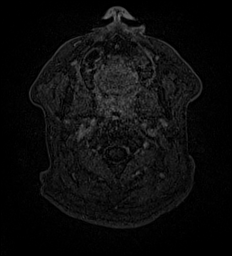
[im 27/176]
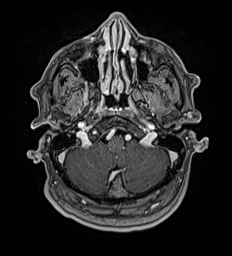
[im 54/176]
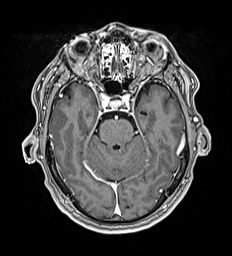
[im 81/176]
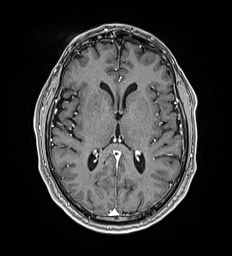
[im 95/176]
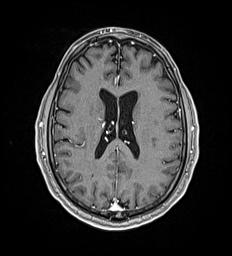
[im 122/176]
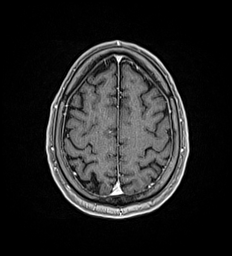
[im 149/176]
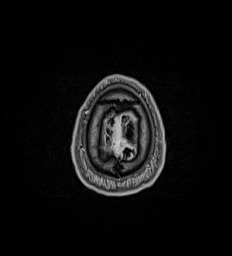
[im 176/176]
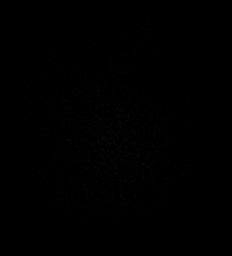

[25 of 48 positions shown; findings below may reference images not displayed]

FINDINGS: Brain:

Cerebral volume is normal for age.

Mild multifocal T2/FLAIR hyperintensity within the cerebral white
matter, nonspecific but compatible with chronic small vessel
ischemic disease.

5 mm hypoenhancing focus within the central pituitary gland (series
19, image 52) (series 101, image 114) (series 100, image 101).

No cerebellopontine angle or internal auditory canal mass is
demonstrated. Unremarkable appearance of the seventh and eighth
cranial nerves bilaterally.

There is no acute infarct.

No chronic intracranial blood products.

No extra-axial fluid collection.

No midline shift.

Vascular: Expected proximal arterial flow voids.

Skull and upper cervical spine: No focal marrow lesion.

Sinuses/Orbits: Visualized orbits show no acute finding. Mild
mucosal thickening within the frontal sinuses. Moderate/severe
mucosal thickening within the bilateral ethmoid air cells. Mild
bilateral sphenoid and maxillary sinus mucosal thickening at the
imaged levels.

Impression #2 will be called to the ordering clinician or
representative by the Radiologist Assistant, and communication
documented in the PACS or [REDACTED].
IMPRESSION: No cerebellopontine angle or internal auditory canal mass.

5 mm hypoenhancing focus within the central pituitary gland, which
could reflect an incidental pituitary microadenoma. Correlate with
relevant laboratory values. Additionally, a dedicated pituitary
protocol brain MRI may be obtained for further evaluation, as
clinically warranted.

Mild cerebral white matter chronic small vessel ischemic disease.

Paranasal sinus disease, as described.

## 2020-09-28 MED ORDER — GADOBUTROL 1 MMOL/ML IV SOLN
9.0000 mL | Freq: Once | INTRAVENOUS | Status: AC | PRN
  Administered 2020-09-28: 9 mL via INTRAVENOUS

## 2020-10-04 ENCOUNTER — Other Ambulatory Visit: Payer: Self-pay

## 2020-10-04 ENCOUNTER — Other Ambulatory Visit: Payer: Self-pay | Admitting: Family Medicine

## 2020-10-04 DIAGNOSIS — I714 Abdominal aortic aneurysm, without rupture, unspecified: Secondary | ICD-10-CM

## 2020-10-04 DIAGNOSIS — E785 Hyperlipidemia, unspecified: Secondary | ICD-10-CM

## 2020-10-04 MED FILL — Rosuvastatin Calcium Tab 20 MG: ORAL | 90 days supply | Qty: 90 | Fill #0 | Status: AC

## 2020-10-05 ENCOUNTER — Other Ambulatory Visit: Payer: Self-pay

## 2020-10-06 ENCOUNTER — Other Ambulatory Visit: Payer: Self-pay

## 2020-10-06 DIAGNOSIS — L280 Lichen simplex chronicus: Secondary | ICD-10-CM | POA: Diagnosis not present

## 2020-10-06 MED ORDER — BETAMETHASONE DIPROPIONATE AUG 0.05 % EX OINT
TOPICAL_OINTMENT | CUTANEOUS | 1 refills | Status: DC
  Filled 2020-10-06: qty 50, 30d supply, fill #0

## 2020-10-20 ENCOUNTER — Other Ambulatory Visit: Payer: Self-pay

## 2020-10-20 ENCOUNTER — Other Ambulatory Visit: Payer: Self-pay | Admitting: Family Medicine

## 2020-10-20 DIAGNOSIS — K219 Gastro-esophageal reflux disease without esophagitis: Secondary | ICD-10-CM

## 2020-10-20 MED ORDER — OMEPRAZOLE 40 MG PO CPDR
DELAYED_RELEASE_CAPSULE | Freq: Every day | ORAL | 1 refills | Status: DC
  Filled 2020-10-20: qty 90, 90d supply, fill #0
  Filled 2021-01-19: qty 90, 90d supply, fill #1

## 2020-10-20 MED FILL — Famotidine Tab 40 MG: ORAL | 90 days supply | Qty: 180 | Fill #0 | Status: AC

## 2020-11-09 DIAGNOSIS — E236 Other disorders of pituitary gland: Secondary | ICD-10-CM | POA: Diagnosis not present

## 2020-11-18 ENCOUNTER — Encounter: Payer: Self-pay | Admitting: Gastroenterology

## 2020-11-26 ENCOUNTER — Other Ambulatory Visit: Payer: Self-pay

## 2020-11-26 DIAGNOSIS — G4733 Obstructive sleep apnea (adult) (pediatric): Secondary | ICD-10-CM | POA: Diagnosis not present

## 2020-11-26 MED ORDER — NA SULFATE-K SULFATE-MG SULF 17.5-3.13-1.6 GM/177ML PO SOLN
ORAL | 0 refills | Status: DC
  Filled 2020-11-26 – 2020-12-01 (×2): qty 354, 1d supply, fill #0

## 2020-12-01 ENCOUNTER — Other Ambulatory Visit: Payer: Self-pay

## 2020-12-01 MED ORDER — PEG 3350-KCL-NABCB-NACL-NASULF 236 G PO SOLR
ORAL | 0 refills | Status: DC
  Filled 2020-12-01 (×2): qty 4000, 1d supply, fill #0

## 2020-12-02 ENCOUNTER — Other Ambulatory Visit: Payer: Self-pay

## 2020-12-03 ENCOUNTER — Ambulatory Visit: Payer: PPO | Admitting: Anesthesiology

## 2020-12-03 ENCOUNTER — Other Ambulatory Visit: Payer: Self-pay

## 2020-12-03 ENCOUNTER — Encounter
Admission: RE | Disposition: A | Discharge status: Home / Self Care | Payer: Self-pay | Source: Home / Self Care | Attending: Gastroenterology

## 2020-12-03 ENCOUNTER — Encounter: Payer: Self-pay | Admitting: Gastroenterology

## 2020-12-03 ENCOUNTER — Ambulatory Visit
Admission: RE | Admit: 2020-12-03 | Discharge: 2020-12-03 | Disposition: A | Discharge status: Home / Self Care | Payer: PPO | Attending: Gastroenterology | Admitting: Gastroenterology

## 2020-12-03 DIAGNOSIS — Z825 Family history of asthma and other chronic lower respiratory diseases: Secondary | ICD-10-CM | POA: Insufficient documentation

## 2020-12-03 DIAGNOSIS — Z808 Family history of malignant neoplasm of other organs or systems: Secondary | ICD-10-CM | POA: Diagnosis not present

## 2020-12-03 DIAGNOSIS — Z87891 Personal history of nicotine dependence: Secondary | ICD-10-CM | POA: Insufficient documentation

## 2020-12-03 DIAGNOSIS — Z79899 Other long term (current) drug therapy: Secondary | ICD-10-CM | POA: Insufficient documentation

## 2020-12-03 DIAGNOSIS — Z8249 Family history of ischemic heart disease and other diseases of the circulatory system: Secondary | ICD-10-CM | POA: Insufficient documentation

## 2020-12-03 DIAGNOSIS — K219 Gastro-esophageal reflux disease without esophagitis: Secondary | ICD-10-CM | POA: Insufficient documentation

## 2020-12-03 DIAGNOSIS — K641 Second degree hemorrhoids: Secondary | ICD-10-CM | POA: Diagnosis not present

## 2020-12-03 DIAGNOSIS — Z1211 Encounter for screening for malignant neoplasm of colon: Secondary | ICD-10-CM

## 2020-12-03 HISTORY — DX: Presence of dental prosthetic device (complete) (partial): Z97.2

## 2020-12-03 HISTORY — PX: COLONOSCOPY WITH PROPOFOL: SHX5780

## 2020-12-03 SURGERY — COLONOSCOPY WITH PROPOFOL
Anesthesia: General | Site: Rectum

## 2020-12-03 MED ORDER — STERILE WATER FOR IRRIGATION IR SOLN
Status: DC | PRN
  Administered 2020-12-03: .05 mL

## 2020-12-03 MED ORDER — SODIUM CHLORIDE 0.9 % IV SOLN: INTRAVENOUS | Status: DC

## 2020-12-03 MED ORDER — PROPOFOL 10 MG/ML IV BOLUS
INTRAVENOUS | Status: DC | PRN
  Administered 2020-12-03: 40 mg via INTRAVENOUS
  Administered 2020-12-03: 20 mg via INTRAVENOUS
  Administered 2020-12-03: 40 mg via INTRAVENOUS
  Administered 2020-12-03: 150 mg via INTRAVENOUS
  Administered 2020-12-03: 50 mg via INTRAVENOUS

## 2020-12-03 MED ORDER — LACTATED RINGERS IV SOLN: INTRAVENOUS | Status: DC

## 2020-12-03 SURGICAL SUPPLY — 6 items
GOWN CVR UNV OPN BCK APRN NK (MISCELLANEOUS) ×2 IMPLANT
GOWN ISOL THUMB LOOP REG UNIV (MISCELLANEOUS) ×4
KIT PRC NS LF DISP ENDO (KITS) ×1 IMPLANT
KIT PROCEDURE OLYMPUS (KITS) ×2
MANIFOLD NEPTUNE II (INSTRUMENTS) ×2 IMPLANT
WATER STERILE IRR 250ML POUR (IV SOLUTION) ×2 IMPLANT

## 2020-12-03 NOTE — Transfer of Care (Signed)
Immediate Anesthesia Transfer of Care Note  Patient: Dustin Cobb  Procedure(s) Performed: COLONOSCOPY WITH PROPOFOL (Rectum)  Patient Location: PACU  Anesthesia Type: General  Level of Consciousness: awake, alert  and patient cooperative  Airway and Oxygen Therapy: Patient Spontanous Breathing and Patient connected to supplemental oxygen  Post-op Assessment: Post-op Vital signs reviewed, Patient's Cardiovascular Status Stable, Respiratory Function Stable, Patent Airway and No signs of Nausea or vomiting  Post-op Vital Signs: Reviewed and stable  Complications: No notable events documented.

## 2020-12-03 NOTE — Op Note (Signed)
[facility] Gastroenterology Patient Name: Dustin Cobb Procedure Date: 12/03/2020 9:08 AM MRN: [id number] Account #: [id number] Date of Birth: 1945/11/28 Admit Type: Outpatient Age: 75 Room: [facility] OR ROOM 01 Gender: Male Note Status: Finalized Procedure:             Colonoscopy Indications:           Screening for colorectal malignant neoplasm Providers:             Lucilla Lame MD, MD Referring MD:          Bethena Roys. Sowles, MD (Referring MD) Medicines:             Propofol per Anesthesia Complications:         No immediate complications. Procedure:             Pre-Anesthesia Assessment:                        - Prior to the procedure, a History and Physical was                         performed, and patient medications and allergies were                         reviewed. The patient's tolerance of previous                         anesthesia was also reviewed. The risks and benefits                         of the procedure and the sedation options and risks                         were discussed with the patient. All questions were                         answered, and informed consent was obtained. Prior                         Anticoagulants: The patient has taken no previous                         anticoagulant or antiplatelet agents. ASA Grade                         Assessment: II - A patient with mild systemic disease.                         After reviewing the risks and benefits, the patient                         was deemed in satisfactory condition to undergo the                         procedure.                        After obtaining informed consent, the colonoscope was  passed under direct vision. Throughout the procedure,                         the patient's blood pressure, pulse, and oxygen                         saturations were monitored continuously. The was                         introduced through the anus and  advanced to the the                         cecum, identified by appendiceal orifice and ileocecal                         valve. The colonoscopy was performed without                         difficulty. The patient tolerated the procedure well.                         The quality of the bowel preparation was good. Findings:      The perianal and digital rectal examinations were normal.      Non-bleeding internal hemorrhoids were found during retroflexion. The       hemorrhoids were Grade II (internal hemorrhoids that prolapse but reduce       spontaneously). Impression:            - Non-bleeding internal hemorrhoids.                        - No specimens collected. Recommendation:        - Discharge patient to home.                        - Resume previous diet.                        - Continue present medications.                        - Await pathology results.                        - Repeat colonoscopy is not recommended for screening                         purposes. Procedure Code(s):     --- Professional ---                        [phone], Colonoscopy, flexible; diagnostic, including                         collection of specimen(s) by brushing or washing, when                         performed (separate procedure) Diagnosis Code(s):     --- Professional ---                        Z12.11, Encounter for  screening for malignant neoplasm                         of colon CPT copyright 2019 American Medical Association. All rights reserved. The codes documented in this report are preliminary and upon coder review may  be revised to meet current compliance requirements. Lucilla Lame MD, MD 12/03/2020 9:28:16 AM This report has been signed electronically. Number of Addenda: 0 Note Initiated On: 12/03/2020 9:08 AM Scope Withdrawal Time: 0 hours 7 minutes 48 seconds  Total Procedure Duration: 0 hours 12 minutes 45 seconds  Estimated Blood Loss:  Estimated blood loss: none.      [facility]

## 2020-12-03 NOTE — Anesthesia Procedure Notes (Signed)
Date/Time: 12/03/2020 9:11 AM Performed by: Cameron Ali, CRNA Pre-anesthesia Checklist: Patient identified, Emergency Drugs available, Suction available, Timeout performed and Patient being monitored Patient Re-evaluated:Patient Re-evaluated prior to induction Oxygen Delivery Method: Nasal cannula Placement Confirmation: positive ETCO2

## 2020-12-03 NOTE — Anesthesia Preprocedure Evaluation (Signed)
Anesthesia Evaluation  Patient identified by MRN, date of birth, ID band Patient awake    Reviewed: Allergy & Precautions, NPO status   Airway Mallampati: II  TM Distance: >3 FB     Dental   Pulmonary sleep apnea and Continuous Positive Airway Pressure Ventilation , former smoker,    Pulmonary exam normal        Cardiovascular hypertension,  Rhythm:Regular Rate:Normal  HLD   Neuro/Psych  Headaches,    GI/Hepatic GERD  ,  Endo/Other  Obesity - BMI >30  Renal/GU      Musculoskeletal   Abdominal   Peds  Hematology   Anesthesia Other Findings   Reproductive/Obstetrics                             Anesthesia Physical Anesthesia Plan  ASA: 3  Anesthesia Plan: General   Post-op Pain Management:    Induction: Intravenous  PONV Risk Score and Plan: Propofol infusion, TIVA and Treatment may vary due to age or medical condition  Airway Management Planned: Natural Airway and Nasal Cannula  Additional Equipment:   Intra-op Plan:   Post-operative Plan:   Informed Consent: I have reviewed the patients History and Physical, chart, labs and discussed the procedure including the risks, benefits and alternatives for the proposed anesthesia with the patient or authorized representative who has indicated his/her understanding and acceptance.       Plan Discussed with: CRNA  Anesthesia Plan Comments:         Anesthesia Quick Evaluation

## 2020-12-03 NOTE — Anesthesia Postprocedure Evaluation (Signed)
Anesthesia Post Note  Patient: Dustin Cobb  Procedure(s) Performed: COLONOSCOPY WITH PROPOFOL (Rectum)     Patient location during evaluation: PACU Anesthesia Type: General Level of consciousness: awake Pain management: pain level controlled Vital Signs Assessment: post-procedure vital signs reviewed and stable Respiratory status: respiratory function stable Cardiovascular status: stable Postop Assessment: no signs of nausea or vomiting Anesthetic complications: no   No notable events documented.  Veda Canning

## 2020-12-03 NOTE — H&P (Signed)
Dustin Lame, MD [address] [place], [place] [address] Phone: [phone] Fax : [phone]  Primary Care Physician:  [name], MD Primary Gastroenterologist:  Dr. Allen Norris  Pre-Procedure History & Physical: HPI:  Dustin Cobb is a 75 y.o. male is here for a screening colonoscopy.   Past Medical History:  Diagnosis Date   Aching headache    Allergy    ED (erectile dysfunction)    GERD (gastroesophageal reflux disease)    Hyperlipidemia    Hypertension    Insomnia    Metabolic syndrome    Onychomycosis    OSA on CPAP    Postural dizziness    Rash    Sleep apnea    Wears partial dentures     Past Surgical History:  Procedure Laterality Date   CARDIAC CATHETERIZATION     COLONOSCOPY     HEMORRHOID BANDING  04/01/2012   Dr. Fleet Contras    Prior to Admission medications   Medication Sig Start Date End Date Taking? Authorizing Provider  augmented betamethasone dipropionate (DIPROLENE-AF) 0.05 % ointment 1(one) application(s) topical 2(two) times a day 10/06/20  Yes   Cholecalciferol (VITAMIN D3) 1000 UNITS CAPS Take 1 capsule by mouth daily.   Yes [provider]  famotidine (PEPCID) 40 MG tablet TAKE 1 TABLET BY MOUTH 2 TIMES DAILY. 07/23/20 07/23/21 Yes Sowles, Drue Stager, MD  ferrous sulfate 325 (65 FE) MG tablet Take 1 tablet by mouth daily.   Yes [provider]  fexofenadine (ALLEGRA) 180 MG tablet Take 180 mg by mouth daily.   Yes [provider]  fluticasone (FLONASE) 50 MCG/ACT nasal spray Place 2 sprays into both nostrils daily. 07/22/15  Yes Sowles, Drue Stager, MD  omeprazole (PRILOSEC) 40 MG capsule TAKE 1 CAPSULE BY MOUTH DAILY. 10/20/20 10/20/21 Yes Sowles, Drue Stager, MD  rosuvastatin (CRESTOR) 20 MG tablet TAKE 1 TABLET BY MOUTH DAILY. 10/04/20 10/04/21 Yes Sowles, Drue Stager, MD  triamcinolone cream (KENALOG) 0.1 % Apply 1 application topically 2 (two) times daily. 08/10/16  Yes Sowles, Drue Stager, MD  COVID-19 mRNA vaccine,  Moderna, 100 MCG/0.5ML injection USE AS DIRECTED 05/05/20 05/05/21  Carlyle Basques, MD  melatonin 3 MG TABS tablet Take 1 tablet (3 mg total) by mouth at bedtime. Patient not taking: No sig reported 02/18/20   [name], MD  polyethylene glycol (GOLYTELY) 236 g solution Drink one 8 oz glass every 20 mins until entire container is finished starting at 5:00pm on 12/02/20 12/01/20   Dustin Lame, MD    Allergies as of 09/03/2020   (No Known Allergies)    Family History  Problem Relation Age of Onset   Cancer Mother        Thyroid   Emphysema Father    COPD Father    Hypertension Sister    Hypertension Brother    Heart disease Brother    Healthy Daughter     Social History   Socioeconomic History   Marital status: Married    Spouse name: [place]   Number of children: 2   Years of education: Not on file   Highest education level: Associate degree: occupational, Hotel manager, or vocational program  Occupational History   Occupation: retired  Tobacco Use   Smoking status: Former    Packs/day: 1.00    Years: 20.00    Pack years: 20.00    Types: Cigarettes    Start date: 01/07/1975    Quit date: 01/07/1995    Years since quitting: 25.9   Smokeless tobacco: Never  Tobacco comments:    smoking cessation materials not required  Vaping Use   Vaping Use: Never used  Substance and Sexual Activity   Alcohol use: No    Alcohol/week: 0.0 standard drinks   Drug use: No   Sexual activity: Yes    Partners: Female    Birth control/protection: None  Other Topics Concern   Not on file  Social History Narrative   ** Merged History Encounter **       Social Determinants of Health   Financial Resource Strain: Low Risk    Difficulty of Paying Living Expenses: Not hard at all  Food Insecurity: No Food Insecurity   Worried About Charity fundraiser in the Last Year: Never true   Arboriculturist in the Last Year: Never true  Transportation Needs: No Transportation Needs   Lack of  Transportation (Medical): No   Lack of Transportation (Non-Medical): No  Physical Activity: Insufficiently Active   Days of Exercise per Week: 1 day   Minutes of Exercise per Session: 20 min  Stress: No Stress Concern Present   Feeling of Stress : Only a little  Social Connections: Moderately Integrated   Frequency of Communication with Friends and Family: Once a week   Frequency of Social Gatherings with Friends and Family: Once a week   Attends Religious Services: More than 4 times per year   Active Member of [employer] or Organizations: Yes   Attends Archivist Meetings: 1 to 4 times per year   Marital Status: Married  Human resources officer Violence: Not At Risk   Fear of Current or Ex-Partner: No   Emotionally Abused: No   Physically Abused: No   Sexually Abused: No    Review of Systems: See HPI, otherwise negative ROS  Physical Exam: BP 115/85   Pulse 87   Temp 99.3 F (37.4 C) (Temporal)   Ht 5\' 8"  (1.727 m)   Wt 91.2 kg   SpO2 94%   BMI 30.56 kg/m  General:   Alert,  pleasant and cooperative in NAD Head:  Normocephalic and atraumatic. Neck:  Supple; no masses or thyromegaly. Lungs:  Clear throughout to auscultation.    Heart:  Regular rate and rhythm. Abdomen:  Soft, nontender and nondistended. Normal bowel sounds, without guarding, and without rebound.   Neurologic:  Alert and  oriented x4;  grossly normal neurologically.  Impression/Plan: Dustin Cobb is now here to undergo a screening colonoscopy.  Risks, benefits, and alternatives regarding colonoscopy have been reviewed with the patient.  Questions have been answered.  All parties agreeable.

## 2020-12-06 ENCOUNTER — Encounter: Payer: Self-pay | Admitting: Gastroenterology

## 2020-12-14 ENCOUNTER — Telehealth: Payer: PPO | Admitting: Nurse Practitioner

## 2020-12-14 DIAGNOSIS — J019 Acute sinusitis, unspecified: Secondary | ICD-10-CM | POA: Diagnosis not present

## 2020-12-14 DIAGNOSIS — B9789 Other viral agents as the cause of diseases classified elsewhere: Secondary | ICD-10-CM

## 2020-12-14 NOTE — Progress Notes (Signed)
E-Visit for Sinus Problems  We are sorry that you are not feeling well.  Here is how we plan to help!  Based on what you have shared with me it looks like you have sinusitis.  Sinusitis is inflammation and infection in the sinus cavities of the head.  Based on your presentation I believe you most likely have Acute Viral Sinusitis.This is an infection most likely caused by a virus. There is not specific treatment for viral sinusitis other than to help you with the symptoms until the infection runs its course.  You may use an oral decongestant such as Mucinex D or if you have glaucoma or high blood pressure use plain Mucinex. Saline nasal spray help and can safely be used as often as needed for congestion, I have prescribed: continue nose spry OTC, force fluids and use OTC decongestant.  Some authorities believe that zinc sprays or the use of Echinacea may shorten the course of your symptoms.  Sinus infections are not as easily transmitted as other respiratory infection, however we still recommend that you avoid close contact with loved ones, especially the very young and elderly.  Remember to wash your hands thoroughly throughout the day as this is the number one way to prevent the spread of infection!  Home Care: Only take medications as instructed by your medical team. Do not take these medications with alcohol. A steam or ultrasonic humidifier can help congestion.  You can place a towel over your head and breathe in the steam from hot water coming from a faucet. Avoid close contacts especially the very young and the elderly. Cover your mouth when you cough or sneeze. Always remember to wash your hands.  Get Help Right Away If: You develop worsening fever or sinus pain. You develop a severe head ache or visual changes. Your symptoms persist after you have completed your treatment plan.  Make sure you Understand these instructions. Will watch your condition. Will get help right away if you  are not doing well or get worse.   Thank you for choosing an e-visit.  Your e-visit answers were reviewed by a board certified advanced clinical practitioner to complete your personal care plan. Depending upon the condition, your plan could have included both over the counter or prescription medications.  Please review your pharmacy choice. Make sure the pharmacy is open so you can pick up prescription now. If there is a problem, you may contact your provider through [employer] and have the prescription routed to another pharmacy.  Your safety is important to [place]. If you have drug allergies check your prescription carefully.   For the next 24 hours you can use MyChart to ask questions about today's visit, request a non-urgent call back, or ask for a work or school excuse. You will get an email in the next two days asking about your experience. I hope that your e-visit has been valuable and will speed your recovery.  5-10 minutes spent reviewing and documenting in chart.

## 2021-01-03 ENCOUNTER — Other Ambulatory Visit: Payer: Self-pay

## 2021-01-03 MED FILL — Rosuvastatin Calcium Tab 20 MG: ORAL | 90 days supply | Qty: 90 | Fill #1 | Status: AC

## 2021-01-19 ENCOUNTER — Other Ambulatory Visit: Payer: Self-pay

## 2021-01-19 ENCOUNTER — Other Ambulatory Visit: Payer: Self-pay | Admitting: Family Medicine

## 2021-01-19 DIAGNOSIS — K219 Gastro-esophageal reflux disease without esophagitis: Secondary | ICD-10-CM

## 2021-01-19 MED ORDER — FAMOTIDINE 40 MG PO TABS
ORAL_TABLET | Freq: Two times a day (BID) | ORAL | 1 refills | Status: DC
  Filled 2021-01-19: qty 180, 90d supply, fill #0
  Filled 2021-04-19: qty 180, 90d supply, fill #1

## 2021-02-22 NOTE — Progress Notes (Signed)
Name: Dustin Cobb   MRN: [id number]    DOB: 1946-05-28   Date:02/23/2021       Progress Note  Subjective  Chief Complaint  Follow Up  HPI  Hyperlipidemia: taking Rosuvastatin now, , no side effects, no chest pain or myalgias, reviewed labs done in 01/2020 and LDL was 66   Abdominal aorta aneurysm: found on screen [place] in 2020 , size was 3.1 cm and is due for recheck in 2023, he is taking statin therapy , he is not on aspirin because he has bleeding hemorrhoids, history of anemia and GERD Discussed referral to vascular surgeon and he agrees    Hyperglycemia:  hgbA1C has been stable at 5.8 % No polyphagia, polydipsia or polyuria He continues to eat a balanced, he still has desserts but not daily    Low TSH: seen by Dr. Gabriel Carina in the past , TSH was a little suppressed in May - checked by Dr. Lacinda Axon He denies palpitation, no significant weight loss or change in bowel movements    GERD: under control with medication, still not following a diet, he continues to drink sodas and sweet tea daily, he drinks it with meals only . He needs refills today    OSA: he has been wearing CPAP machine almost every night. . He wakes up feeling rested most days. He states no leakage , pressure seems okay for him , he states sometimes has restless sleep, melatonin did not help and he stopped He wakes up feeling tired, we will try Belsomra and may need to add provigil    Obesity: discussed trying to go down to below 200 lbs. He is more aware of portion sizes, he is eating more vegetables. Needs to drink more water and stop drinking sodas and sweet tea.    AR: on nasal steroid and claritin, still has intermittent symptoms. Worse during Fall and Spring . Unchanged   Sellar Mass: MRI ordered by ENT for evaluation of hearing loss, incidental finding of pituitary adenoma, seen by Dr. Lacinda Axon and will have repeat MRI yearly   Impression/Plan:  Dustin Cobb is here for evaluation of an abnormality found on MRI of the  brain. We did discuss that there is some small area of hypoenhancing focus which could be a small nodule. Given this, I would recommend laboratory evaluation of the pituitary gland. However, no other treatment is needed at this time if those labs are normal. I do not suspect any vision problems from this given the small nature. We did discuss the natural history of nodules in this area and it is most likely benign. I cannot prove this is neoplastic at this time and therefore I do recommend a repeat MRI in 1 year. He agrees to this plan.   Patient Active Problem List   Diagnosis Date Noted   Encounter for screening colonoscopy    Aneurysm of abdominal aorta ([place]) 21/19/4174   Lichen simplex chronicus 02/13/2018   Rash of hands 08/10/2016   Hypertension, essential, benign 01/07/2015   GERD (gastroesophageal reflux disease) 01/07/2015   OSA on CPAP 01/07/2015   Low TSH level 01/07/2015   Hyperglycemia 01/07/2015   Obesity (BMI 30.0-34.9) 01/07/2015   Dyslipidemia 01/07/2015   History of hemorrhoidectomy 01/07/2015   History of iron deficiency anemia 01/07/2015   Allergic rhinitis 01/09/4817   Metabolic syndrome 56/31/4970    Past Surgical History:  Procedure Laterality Date   CARDIAC CATHETERIZATION     COLONOSCOPY     COLONOSCOPY WITH PROPOFOL N/A  12/03/2020   Procedure: COLONOSCOPY WITH PROPOFOL;  Surgeon: Lucilla Lame, MD;  Location: [facility];  Service: Endoscopy;  Laterality: N/A;   HEMORRHOID BANDING  04/01/2012   Dr. Fleet Contras    Family History  Problem Relation Age of Onset   Cancer Mother        Thyroid   Emphysema Father    COPD Father    Hypertension Sister    Hypertension Brother    Heart disease Brother    Healthy Daughter     Social History   Tobacco Use   Smoking status: Former    Packs/day: 1.00    Years: 20.00    Pack years: 20.00    Types: Cigarettes    Start date: 01/07/1975    Quit date: 01/07/1995    Years since quitting: 26.1   Smokeless  tobacco: Never   Tobacco comments:    smoking cessation materials not required  Substance Use Topics   Alcohol use: No    Alcohol/week: 0.0 standard drinks     Current Outpatient Medications:    augmented betamethasone dipropionate (DIPROLENE-AF) 0.05 % ointment, 1(one) application(s) topical 2(two) times a day, Disp: 50 g, Rfl: 1   Cholecalciferol (VITAMIN D3) 1000 UNITS CAPS, Take 1 capsule by mouth daily., Disp: , Rfl:    famotidine (PEPCID) 40 MG tablet, TAKE 1 TABLET BY MOUTH 2 TIMES DAILY., Disp: 180 tablet, Rfl: 1   ferrous sulfate 325 (65 FE) MG tablet, Take 1 tablet by mouth daily., Disp: , Rfl:    fexofenadine (ALLEGRA) 180 MG tablet, Take 180 mg by mouth daily., Disp: , Rfl:    fluticasone (FLONASE) 50 MCG/ACT nasal spray, Place 2 sprays into both nostrils daily., Disp: 16 g, Rfl: 5   melatonin 3 MG TABS tablet, Take 1 tablet (3 mg total) by mouth at bedtime., Disp: 30 tablet, Rfl: 0   omeprazole (PRILOSEC) 40 MG capsule, TAKE 1 CAPSULE BY MOUTH DAILY., Disp: 90 capsule, Rfl: 1   polyethylene glycol (GOLYTELY) 236 g solution, Drink one 8 oz glass every 20 mins until entire container is finished starting at 5:00pm on 12/02/20, Disp: 4000 mL, Rfl: 0   rosuvastatin (CRESTOR) 20 MG tablet, TAKE 1 TABLET BY MOUTH DAILY., Disp: 90 tablet, Rfl: 1  No Known Allergies  I personally reviewed active problem list, medication list, allergies, family history, social history, health maintenance with the patient/caregiver today.   ROS  Constitutional: Negative for fever or weight change.  Respiratory: Negative for cough and shortness of breath.   Cardiovascular: Negative for chest pain or palpitations.  Gastrointestinal: Negative for abdominal pain, no bowel changes.  Musculoskeletal: Negative for gait problem or joint swelling.  Skin: Negative for rash.  Neurological: Negative for dizziness or headache.  No other specific complaints in a complete review of systems (except as listed in  HPI above).   Objective  Vitals:   02/23/21 0835  BP: 130/82  Pulse: 64  Resp: 16  Temp: 98.1 F (36.7 C)  SpO2: 98%  Weight: 207 lb (93.9 kg)  Height: 5\' 8"  (1.727 m)    Body mass index is 31.47 kg/m.  Physical Exam  Constitutional: Patient appears well-developed and well-nourished. Obese  No distress.  HEENT: head atraumatic, normocephalic, pupils equal and reactive to light, neck supple Cardiovascular: Normal rate, regular rhythm and normal heart sounds.  No murmur heard. No BLE edema. Pulmonary/Chest: Effort normal and breath sounds normal. No respiratory distress. Abdominal: Soft.  There is no tenderness. Psychiatric: Patient has a  normal mood and affect. behavior is normal. Judgment and thought content normal.    PHQ2/9: Depression screen [facility] 2/9 02/23/2021 08/23/2020 08/17/2020 02/18/2020 11/19/2019  Decreased Interest 0 0 0 0 0  Down, Depressed, Hopeless 0 0 0 0 0  PHQ - 2 Score 0 0 0 0 0  Altered sleeping - 0 - - -  Tired, decreased energy - 0 - - -  Change in appetite - 0 - - -  Feeling bad or failure about yourself  - 0 - - -  Trouble concentrating - 0 - - -  Moving slowly or fidgety/restless - 0 - - -  Suicidal thoughts - 0 - - -  PHQ-9 Score - 0 - - -  Difficult doing work/chores - - - - -  Some recent data might be hidden    phq 9 is negative   Fall Risk: Fall Risk  02/23/2021 08/23/2020 08/17/2020 02/18/2020 11/19/2019  Falls in the past year? 0 0 0 0 0  Number falls in past yr: 0 0 0 0 -  Injury with Fall? 0 0 0 0 -  Risk for fall due to : No Fall Risks - No Fall Risks - -  Follow up Falls prevention discussed - Falls prevention discussed - -      Functional Status Survey: Is the patient deaf or have difficulty hearing?: Yes Does the patient have difficulty seeing, even when wearing glasses/contacts?: No Does the patient have difficulty concentrating, remembering, or making decisions?: No Does the patient have difficulty walking or climbing  stairs?: No Does the patient have difficulty dressing or bathing?: No Does the patient have difficulty doing errands alone such as visiting a doctor's office or shopping?: No    Assessment & Plan  1. Abdominal aortic aneurysm (AAA) without rupture ([place])  - Ambulatory referral to Vascular Surgery - rosuvastatin (CRESTOR) 20 MG tablet; TAKE 1 TABLET BY MOUTH DAILY.  Dispense: 90 tablet; Refill: 1  2. Gastroesophageal reflux disease without esophagitis  - omeprazole (PRILOSEC) 40 MG capsule; TAKE 1 CAPSULE BY MOUTH DAILY.  Dispense: 90 capsule; Refill: 1  3. Dyslipidemia  - Lipid panel - rosuvastatin (CRESTOR) 20 MG tablet; TAKE 1 TABLET BY MOUTH DAILY.  Dispense: 90 tablet; Refill: 1  4. Need for immunization against influenza  - Flu Vaccine QUAD High Dose(Fluad)  5. OSA on CPAP   6. Hypertension, essential, benign  - COMPLETE METABOLIC PANEL WITH GFR - CBC with Differential/Platelet  7. History of iron deficiency anemia  - CBC with Differential/Platelet  8. Vitamin D deficiency  Continue supplementation   9. Metabolic syndrome   10. Hyperglycemia  - Hemoglobin A1c  11. Perennial allergic rhinitis with seasonal variation   12. Subclinical hypothyroidism   13. Primary insomnia  - Suvorexant (BELSOMRA) 10 MG TABS; Take 10 mg by mouth at bedtime as needed.  Dispense: 30 tablet; Refill: 0

## 2021-02-23 ENCOUNTER — Other Ambulatory Visit: Payer: Self-pay

## 2021-02-23 ENCOUNTER — Ambulatory Visit (INDEPENDENT_AMBULATORY_CARE_PROVIDER_SITE_OTHER): Payer: PPO | Admitting: Family Medicine

## 2021-02-23 ENCOUNTER — Encounter: Payer: Self-pay | Admitting: Family Medicine

## 2021-02-23 VITALS — BP 130/82 | HR 64 | Temp 98.1°F | Resp 16 | Ht 68.0 in | Wt 207.0 lb

## 2021-02-23 DIAGNOSIS — E8881 Metabolic syndrome: Secondary | ICD-10-CM | POA: Diagnosis not present

## 2021-02-23 DIAGNOSIS — J3089 Other allergic rhinitis: Secondary | ICD-10-CM | POA: Diagnosis not present

## 2021-02-23 DIAGNOSIS — E785 Hyperlipidemia, unspecified: Secondary | ICD-10-CM

## 2021-02-23 DIAGNOSIS — R739 Hyperglycemia, unspecified: Secondary | ICD-10-CM | POA: Diagnosis not present

## 2021-02-23 DIAGNOSIS — K219 Gastro-esophageal reflux disease without esophagitis: Secondary | ICD-10-CM

## 2021-02-23 DIAGNOSIS — I714 Abdominal aortic aneurysm, without rupture, unspecified: Secondary | ICD-10-CM

## 2021-02-23 DIAGNOSIS — E038 Other specified hypothyroidism: Secondary | ICD-10-CM

## 2021-02-23 DIAGNOSIS — E559 Vitamin D deficiency, unspecified: Secondary | ICD-10-CM

## 2021-02-23 DIAGNOSIS — I10 Essential (primary) hypertension: Secondary | ICD-10-CM | POA: Diagnosis not present

## 2021-02-23 DIAGNOSIS — Z862 Personal history of diseases of the blood and blood-forming organs and certain disorders involving the immune mechanism: Secondary | ICD-10-CM

## 2021-02-23 DIAGNOSIS — F5101 Primary insomnia: Secondary | ICD-10-CM

## 2021-02-23 DIAGNOSIS — Z9989 Dependence on other enabling machines and devices: Secondary | ICD-10-CM

## 2021-02-23 DIAGNOSIS — J302 Other seasonal allergic rhinitis: Secondary | ICD-10-CM

## 2021-02-23 DIAGNOSIS — Z23 Encounter for immunization: Secondary | ICD-10-CM

## 2021-02-23 DIAGNOSIS — G4733 Obstructive sleep apnea (adult) (pediatric): Secondary | ICD-10-CM

## 2021-02-23 MED ORDER — ROSUVASTATIN CALCIUM 20 MG PO TABS
ORAL_TABLET | Freq: Every day | ORAL | 1 refills | Status: DC
  Filled 2021-02-23 – 2021-04-07 (×2): qty 90, 90d supply, fill #0
  Filled 2021-07-11: qty 90, 90d supply, fill #1

## 2021-02-23 MED ORDER — OMEPRAZOLE 40 MG PO CPDR
DELAYED_RELEASE_CAPSULE | Freq: Every day | ORAL | 1 refills | Status: DC
  Filled 2021-02-23 – 2021-04-19 (×2): qty 90, 90d supply, fill #0
  Filled 2021-07-18: qty 90, 90d supply, fill #1

## 2021-02-23 MED ORDER — BELSOMRA 10 MG PO TABS
10.0000 mg | ORAL_TABLET | Freq: Every evening | ORAL | 0 refills | Status: DC | PRN
Start: 2021-02-23 — End: 2021-03-08
  Filled 2021-02-23: qty 30, 30d supply, fill #0

## 2021-02-24 ENCOUNTER — Other Ambulatory Visit: Payer: Self-pay

## 2021-02-24 LAB — COMPLETE METABOLIC PANEL WITHOUT GFR
AG Ratio: 1.4 (calc) (ref 1.0–2.5)
ALT: 19 U/L (ref 9–46)
AST: 25 U/L (ref 10–35)
Albumin: 4 g/dL (ref 3.6–5.1)
Alkaline phosphatase (APISO): 83 U/L (ref 35–144)
BUN: 16 mg/dL (ref 7–25)
CO2: 27 mmol/L (ref 20–32)
Calcium: 9.5 mg/dL (ref 8.6–10.3)
Chloride: 107 mmol/L (ref 98–110)
Creat: 1.14 mg/dL (ref 0.70–1.28)
Globulin: 2.8 g/dL (ref 1.9–3.7)
Glucose, Bld: 86 mg/dL (ref 65–99)
Potassium: 4.6 mmol/L (ref 3.5–5.3)
Sodium: 139 mmol/L (ref 135–146)
Total Bilirubin: 0.7 mg/dL (ref 0.2–1.2)
Total Protein: 6.8 g/dL (ref 6.1–8.1)
eGFR: 67 mL/min/1.73m2 (ref >=60)

## 2021-02-24 LAB — CBC WITH DIFFERENTIAL/PLATELET
Absolute Monocytes: 499 {cells}/uL (ref 200–950)
Basophils Absolute: 52 {cells}/uL (ref 0–200)
Basophils Relative: 1 %
Eosinophils Absolute: 192 {cells}/uL (ref 15–500)
Eosinophils Relative: 3.7 %
HCT: 43.8 % (ref 38.5–50.0)
Hemoglobin: 14.4 g/dL (ref 13.2–17.1)
Lymphs Abs: 2080 {cells}/uL (ref 850–3900)
MCH: 28 pg (ref 27.0–33.0)
MCHC: 32.9 g/dL (ref 32.0–36.0)
MCV: 85.2 fL (ref 80.0–100.0)
MPV: 9.7 fL (ref 7.5–12.5)
Monocytes Relative: 9.6 %
Neutro Abs: 2376 {cells}/uL (ref 1500–7800)
Neutrophils Relative %: 45.7 %
Platelets: 197 Thousand/uL (ref 140–400)
RBC: 5.14 Million/uL (ref 4.20–5.80)
RDW: 14 % (ref 11.0–15.0)
Total Lymphocyte: 40 %
WBC: 5.2 Thousand/uL (ref 3.8–10.8)

## 2021-02-24 LAB — LIPID PANEL
Cholesterol: 138 mg/dL (ref <200)
HDL: 60 mg/dL (ref >=40)
LDL Cholesterol (Calc): 65 mg/dL
Non-HDL Cholesterol (Calc): 78 mg/dL (ref <130)
Total CHOL/HDL Ratio: 2.3 (calc) (ref <5.0)
Triglycerides: 58 mg/dL (ref <150)

## 2021-02-24 LAB — HEMOGLOBIN A1C
Hgb A1c MFr Bld: 5.7 %{Hb} — ABNORMAL HIGH (ref <5.7)
Mean Plasma Glucose: 117 mg/dL
eAG (mmol/L): 6.5 mmol/L

## 2021-03-03 ENCOUNTER — Telehealth: Payer: Self-pay

## 2021-03-03 NOTE — Telephone Encounter (Signed)
Copied from [place] [id number]. Topic: General - Other >> Mar 03, 2021  9:32 AM Yvette Rack wrote: Reason for CRM: Pt stated the Rx for Suvorexant (BELSOMRA) 10 MG TABS has a $100 deductible so he would like to ask if Dr. Ancil Boozer feels that Rx for either Temazepam (Tier 3) or Zaleplon (Tier 2) are good alternatives. Pt requests return call. Cb# [phone]

## 2021-03-08 ENCOUNTER — Other Ambulatory Visit: Payer: Self-pay | Admitting: Family Medicine

## 2021-03-08 ENCOUNTER — Other Ambulatory Visit: Payer: Self-pay

## 2021-03-08 DIAGNOSIS — F5101 Primary insomnia: Secondary | ICD-10-CM

## 2021-03-08 MED ORDER — TEMAZEPAM 15 MG PO CAPS
15.0000 mg | ORAL_CAPSULE | Freq: Every evening | ORAL | 0 refills | Status: DC | PRN
Start: 2021-03-08 — End: 2021-04-07
  Filled 2021-03-08: qty 30, 30d supply, fill #0

## 2021-03-17 DIAGNOSIS — H903 Sensorineural hearing loss, bilateral: Secondary | ICD-10-CM | POA: Diagnosis not present

## 2021-03-17 DIAGNOSIS — H90A22 Sensorineural hearing loss, unilateral, left ear, with restricted hearing on the contralateral side: Secondary | ICD-10-CM | POA: Diagnosis not present

## 2021-04-07 ENCOUNTER — Other Ambulatory Visit: Payer: Self-pay | Admitting: Family Medicine

## 2021-04-07 ENCOUNTER — Other Ambulatory Visit: Payer: Self-pay

## 2021-04-07 DIAGNOSIS — F5101 Primary insomnia: Secondary | ICD-10-CM

## 2021-04-07 MED ORDER — TEMAZEPAM 15 MG PO CAPS
15.0000 mg | ORAL_CAPSULE | Freq: Every evening | ORAL | 0 refills | Status: DC | PRN
  Filled 2021-04-07: qty 30, 30d supply, fill #0

## 2021-04-19 ENCOUNTER — Other Ambulatory Visit: Payer: Self-pay

## 2021-05-12 ENCOUNTER — Other Ambulatory Visit: Payer: Self-pay | Admitting: Family Medicine

## 2021-05-12 DIAGNOSIS — F5101 Primary insomnia: Secondary | ICD-10-CM

## 2021-05-13 ENCOUNTER — Other Ambulatory Visit: Payer: Self-pay

## 2021-05-13 MED ORDER — TEMAZEPAM 15 MG PO CAPS
15.0000 mg | ORAL_CAPSULE | Freq: Every evening | ORAL | 0 refills | Status: DC | PRN
  Filled 2021-05-13: qty 90, 90d supply, fill #0

## 2021-05-26 ENCOUNTER — Other Ambulatory Visit: Payer: Self-pay

## 2021-05-26 MED ORDER — BETAMETHASONE DIPROPIONATE AUG 0.05 % EX OINT
TOPICAL_OINTMENT | CUTANEOUS | 0 refills | Status: DC
  Filled 2021-05-26: qty 50, 30d supply, fill #0

## 2021-07-08 NOTE — Progress Notes (Signed)
[facility], [place] [address]  Pulmonary Sleep Medicine   Office Visit Note  Patient Name: Dustin Cobb DOB: Nov 01, 1945 MRN [id number]    Chief Complaint: Obstructive Sleep Apnea visit  Brief History:  Dustin Cobb is seen today for follow up  The patient has a 7 year history of sleep apnea. Patient is using PAP nightly.  The patient feels good sometimes, sometimes not so good after sleeping with PAP.  Bedtime is between 11pm - midnight.The patient reports heart benefits from PAP use so he tolerates. Reported sleepiness is  somewhat resolved and the Epworth Sleepiness Score is 10 out of 24. The patient does not take naps. The patient complains of the following: noisy mask sometimes and and dryness. Now takes new sleep med nightly to help his mind relax when he goes to bed.  Temazepam, 15 mg, one tablet nightly at bedtime   The compliance download shows  compliance with an average use time of 4:47 hours @ 76%. The AHI is 3.8  The patient does not complain of limb movements disrupting sleep.  ROS  General: (-) fever, (-) chills, (-) night sweat Nose and Sinuses: (-) nasal stuffiness or itchiness, (-) postnasal drip, (-) nosebleeds, (-) sinus trouble. Mouth and Throat: (-) sore throat, (-) hoarseness. Neck: (-) swollen glands, (-) enlarged thyroid, (-) neck pain. Respiratory: - cough, - shortness of breath, - wheezing. Neurologic: - numbness, - tingling. Psychiatric: - anxiety, - depression   Current Medication: Outpatient Encounter Medications as of 07/11/2021  Medication Sig   augmented betamethasone dipropionate (DIPROLENE-AF) 0.05 % ointment 1(one) application(s) topical 2(two) times a day   augmented betamethasone dipropionate (DIPROLENE-AF) 0.05 % ointment 1(one) application(s) topical 2(two) times a day   Cholecalciferol (VITAMIN D3) 1000 UNITS CAPS Take 1 capsule by mouth daily.   famotidine (PEPCID) 40 MG tablet TAKE 1 TABLET BY MOUTH 2  TIMES DAILY.   ferrous sulfate 325 (65 FE) MG tablet Take 1 tablet by mouth daily.   fexofenadine (ALLEGRA) 180 MG tablet Take 180 mg by mouth daily.   fluticasone (FLONASE) 50 MCG/ACT nasal spray Place 2 sprays into both nostrils daily.   omeprazole (PRILOSEC) 40 MG capsule TAKE 1 CAPSULE BY MOUTH DAILY.   rosuvastatin (CRESTOR) 20 MG tablet TAKE 1 TABLET BY MOUTH DAILY.   temazepam (RESTORIL) 15 MG capsule Take 1 capsule (15 mg total) by mouth at bedtime as needed for sleep.   No facility-administered encounter medications on file as of 07/11/2021.    Surgical History: Past Surgical History:  Procedure Laterality Date   CARDIAC CATHETERIZATION     COLONOSCOPY     COLONOSCOPY WITH PROPOFOL N/A 12/03/2020   Procedure: COLONOSCOPY WITH PROPOFOL;  Surgeon: Lucilla Lame, MD;  Location: [facility];  Service: Endoscopy;  Laterality: N/A;   HEMORRHOID BANDING  04/01/2012   Dr. Fleet Contras    Medical History: Past Medical History:  Diagnosis Date   Aching headache    Allergy    ED (erectile dysfunction)    GERD (gastroesophageal reflux disease)    Hyperlipidemia    Hypertension    Insomnia    Metabolic syndrome    Onychomycosis    OSA on CPAP    Postural dizziness    Rash    Sleep apnea    Wears partial dentures     Family History: Non contributory to the present illness  Social History: Social History   Socioeconomic History   Marital status: Married    Spouse name:  [place]   Number of children: 2   Years of education: Not on file   Highest education level: Associate degree: occupational, Hotel manager, or vocational program  Occupational History   Occupation: retired  Tobacco Use   Smoking status: Former    Packs/day: 1.00    Years: 20.00    Pack years: 20.00    Types: Cigarettes    Start date: 01/07/1975    Quit date: 01/07/1995    Years since quitting: 26.5   Smokeless tobacco: Never   Tobacco comments:    smoking cessation materials not required  Vaping Use    Vaping Use: Never used  Substance and Sexual Activity   Alcohol use: No    Alcohol/week: 0.0 standard drinks   Drug use: No   Sexual activity: Yes    Partners: Female    Birth control/protection: None  Other Topics Concern   Not on file  Social History Narrative   ** Merged History Encounter **       Social Determinants of Health   Financial Resource Strain: Low Risk    Difficulty of Paying Living Expenses: Not hard at all  Food Insecurity: No Food Insecurity   Worried About Charity fundraiser in the Last Year: Never true   [place] in the Last Year: Never true  Transportation Needs: No Transportation Needs   Lack of Transportation (Medical): No   Lack of Transportation (Non-Medical): No  Physical Activity: Insufficiently Active   Days of Exercise per Week: 1 day   Minutes of Exercise per Session: 20 min  Stress: No Stress Concern Present   Feeling of Stress : Only a little  Social Connections: Moderately Integrated   Frequency of Communication with Friends and Family: Once a week   Frequency of Social Gatherings with Friends and Family: Once a week   Attends Religious Services: More than 4 times per year   Active Member of [employer] or Organizations: Yes   Attends Archivist Meetings: 1 to 4 times per year   Marital Status: Married  Human resources officer Violence: Not At Risk   Fear of Current or Ex-Partner: No   Emotionally Abused: No   Physically Abused: No   Sexually Abused: No    Vital Signs: Blood pressure 121/82, pulse 74, resp. rate 16, height 5\' 8"  (1.727 m), weight 208 lb (94.3 kg), SpO2 96 %. Body mass index is 31.63 kg/m.    Examination: General Appearance: The patient is well-developed, well-nourished, and in no distress. Neck Circumference: 44.5 Skin: Gross inspection of skin unremarkable. Head: normocephalic, no gross deformities. Eyes: no gross deformities noted. ENT: ears appear grossly normal Neurologic: Alert and oriented. No  involuntary movements.    EPWORTH SLEEPINESS SCALE:  Scale:  (0)= no chance of dozing; (1)= slight chance of dozing; (2)= moderate chance of dozing; (3)= high chance of dozing  Chance  Situtation    Sitting and reading: 0    Watching TV: 1    Sitting Inactive in public: 2    As a passenger in car: 1      Lying down to rest: 3    Sitting and talking: 1    Sitting quielty after lunch: 2    In a car, stopped in traffic: 0   TOTAL SCORE:   10 out of 24    SLEEP STUDIES:  PSG 07/23/14 - AHI 10.6, REM AHI 42.7, Low SpO2 87%   CPAP COMPLIANCE DATA:  Date Range: 07/11/20 - 07/10/21  Average Daily Use: 4:47 hours  Median Use: 5:49 hours  Compliance for > 4 Hours: 76%  AHI: 3.8 respiratory events per hour  Days Used: 325/365  Mask Leak: 46.2 lpm  95th Percentile Pressure: 17/12 cmH2O         LABS: No results found for this or any previous visit (from the past 2160 hour(s)).  Radiology: No results found.  No results found.  No results found.    Assessment and Plan: Patient Active Problem List   Diagnosis Date Noted   OSA treated with BiPAP 07/11/2021   Encounter for BiPAP use counseling 07/11/2021   Encounter for screening colonoscopy    Aneurysm of abdominal aorta 03/54/6568   Lichen simplex chronicus 02/13/2018   Rash of hands 08/10/2016   Hypertension, essential, benign 01/07/2015   GERD (gastroesophageal reflux disease) 01/07/2015   OSA on CPAP 01/07/2015   Low TSH level 01/07/2015   Hyperglycemia 01/07/2015   Obesity (BMI 30.0-34.9) 01/07/2015   Dyslipidemia 01/07/2015   History of hemorrhoidectomy 01/07/2015   History of iron deficiency anemia 01/07/2015   Allergic rhinitis 12/75/1700   Metabolic syndrome 17/49/4496   1. OSA treated with BiPAP The patient does tolerate PAP and reports  benefit from PAP use. The patient was reminded how to clean equipment and advised to replace supplies routinely. The patient was also counselled  on weight loss. The compliance is good. The AHI is 3.7 .   OSA- continue with good compliance. Work on Primary school teacher and replacing them more frequently to minimize leak. F/u one year.    2. Encounter for BiPAP use counseling  Counseling: had a lengthy discussion with the patient regarding the importance of PAP therapy in management of the sleep apnea. Patient appears to understand the risk factor reduction and also understands the risks associated with untreated sleep apnea. Patient will try to make a good faith effort to remain compliant with therapy. Also instructed the patient on proper cleaning of the device including the water must be changed daily if possible and use of distilled water is preferred. Patient understands that the machine should be regularly cleaned with appropriate recommended cleaning solutions that do not damage the PAP machine for example given white vinegar and water rinses. Other methods such as ozone treatment may not be as good as these simple methods to achieve cleaning.   3. Hypertension, essential, benign Hypertension Counseling:   The following hypertensive lifestyle modification were recommended and discussed:  1. Limiting alcohol intake to less than 1 oz/day of ethanol:(24 oz of beer or 8 oz of wine or 2 oz of 100-proof whiskey). 2. Take baby ASA 81 mg daily. 3. Importance of regular aerobic exercise and losing weight. 4. Reduce dietary saturated fat and cholesterol intake for overall cardiovascular health. 5. Maintaining adequate dietary potassium, calcium, and magnesium intake. 6. Regular monitoring of the blood pressure. 7. Reduce sodium intake to less than 100 mmol/day (less than 2.3 gm of sodium or less than 6 gm of sodium choride)      General Counseling: I have discussed the findings of the evaluation and examination with [place].  I have also discussed any further diagnostic evaluation thatmay be needed or ordered today. Denario verbalizes understanding  of the findings of todays visit. We also reviewed his medications today and discussed drug interactions and side effects including but not limited excessive drowsiness and altered mental states. We also discussed that there is always a risk not just to him but also people around him. he  has been encouraged to call the office with any questions or concerns that should arise related to todays visit.  No orders of the defined types were placed in this encounter.       I have personally obtained a history, examined the patient, evaluated laboratory and imaging results, formulated the assessment and plan and placed orders. This patient was seen today by Tressie Ellis, PA-C in collaboration with Dr. Devona Konig.   Allyne Gee, MD [facility] Diplomate ABMS Pulmonary Critical Care Medicine and Sleep Medicine

## 2021-07-11 ENCOUNTER — Ambulatory Visit (INDEPENDENT_AMBULATORY_CARE_PROVIDER_SITE_OTHER): Payer: PPO | Admitting: Internal Medicine

## 2021-07-11 ENCOUNTER — Other Ambulatory Visit: Payer: Self-pay

## 2021-07-11 VITALS — BP 121/82 | HR 74 | Resp 16 | Ht 68.0 in | Wt 208.0 lb

## 2021-07-11 DIAGNOSIS — Z7189 Other specified counseling: Secondary | ICD-10-CM | POA: Diagnosis not present

## 2021-07-11 DIAGNOSIS — G4733 Obstructive sleep apnea (adult) (pediatric): Secondary | ICD-10-CM

## 2021-07-11 DIAGNOSIS — I10 Essential (primary) hypertension: Secondary | ICD-10-CM | POA: Diagnosis not present

## 2021-07-11 NOTE — Patient Instructions (Signed)

## 2021-07-18 ENCOUNTER — Other Ambulatory Visit: Payer: Self-pay | Admitting: Family Medicine

## 2021-07-18 ENCOUNTER — Other Ambulatory Visit: Payer: Self-pay

## 2021-07-18 DIAGNOSIS — K219 Gastro-esophageal reflux disease without esophagitis: Secondary | ICD-10-CM

## 2021-07-18 MED ORDER — FAMOTIDINE 40 MG PO TABS
ORAL_TABLET | Freq: Two times a day (BID) | ORAL | 1 refills | Status: DC
  Filled 2021-07-18: qty 180, 90d supply, fill #0
  Filled 2021-10-27: qty 180, 90d supply, fill #1

## 2021-07-19 ENCOUNTER — Other Ambulatory Visit: Payer: Self-pay

## 2021-08-22 NOTE — Progress Notes (Signed)
Name: Dustin Cobb   MRN: [id number]    DOB: 05-31-1945   Date:08/23/2021 ? ?     Progress Note ? ?Subjective ? ?Chief Complaint ? ?Follow Up ? ?HPI ? ?Hyperlipidemia: taking Rosuvastatin now, no side effects, no chest pain or myalgias, reviewed labs done in 09/22 and LDL was down to 65  ? ?Abdominal aorta aneurysm: found on screen [place] in 2020 , size was 3.1 cm and is due for recheck in 2023, he is taking statin therapy , he is not on aspirin because he has bleeding hemorrhoids, history of anemia and GERD . He saw vascular surgeon and will have repeat doppler US this Fall - already scheduled  ?  ?Hyperglycemia:  hgbA1C has been stable  last level was 5.7 %  No polyphagia, polydipsia or polyuria He continues to eat a balanced, he still has desserts but not daily  ?  ?Low TSH: seen by Dustin Cobb in the past , TSH was a little suppressed in May 22 but normal Free T4. He denies palpitation or dysphagia.  ?  ?GERD: he was doing well, however over the past couple months ago he noticed worsening of symptoms. He states taking PPI but also rollaids a few times a day. He states better when he eats but a couple of hours later he noticed burning on substernal area, it does not radiate, not associated with SOB or diaphoresis Appetite and weight has been stable. He has a history of hemorrhoids but denies blood in stools. We will refer him to GI for further evaluation  ?  ?OSA: he has been wearing CPAP machine almost every night. . He wakes up feeling rested most days. He states no leakage , pressure seems okay for him , he states sometimes has restless sleep, melatonin did not help and he stopped He wakes up feeling tired, we sent rx for Belsomra but not covered by insurance so we changed to Temazepam, he is sleeping better but still wakes up during the night. He does not think he needs anything for energy during the day  ?  ?Obesity: discussed trying to go down to below 200 lbs. He is more aware of portion sizes, he is eating  more vegetables. Needs to drink more water , he is trying to cut back on sodas and sweet tea, nor for his weight but because of worsening of GI symptoms  ?  ?AR: on nasal steroid and claritin, still has intermittent symptoms. Worse during Fall and Spring . He is noticing more post-nasal drainage.  ? ?Sellar Mass: MRI ordered by ENT for evaluation of hearing loss, incidental finding of pituitary adenoma, seen by Dustin Cobb and will have repeat MRI yearly . He denies double vision, balance problems or weakness  ? ?Impression/Plan:  ?Dustin Cobb is here for evaluation of an abnormality found on MRI of the brain. We did discuss that there is some small area of hypoenhancing focus which could be a small nodule. Given this, I would recommend laboratory evaluation of the pituitary gland. However, no other treatment is needed at this time if those labs are normal. I do not suspect any vision problems from this given the small nature. We did discuss the natural history of nodules in this area and it is most likely benign. I cannot prove this is neoplastic at this time and therefore I do recommend a repeat MRI in 1 year. He agrees to this plan. ? ? ?Patient Active Problem List  ? Diagnosis Date Noted  ?  OSA treated with BiPAP 07/11/2021  ? Encounter for BiPAP use counseling 07/11/2021  ? Encounter for screening colonoscopy   ? Aneurysm of abdominal aorta 09/19/2018  ? Lichen simplex chronicus 02/13/2018  ? Rash of hands 08/10/2016  ? Hypertension, essential, benign 01/07/2015  ? GERD (gastroesophageal reflux disease) 01/07/2015  ? OSA on CPAP 01/07/2015  ? Low TSH level 01/07/2015  ? Hyperglycemia 01/07/2015  ? Obesity (BMI 30.0-34.9) 01/07/2015  ? Dyslipidemia 01/07/2015  ? History of hemorrhoidectomy 01/07/2015  ? History of iron deficiency anemia 01/07/2015  ? Allergic rhinitis 01/07/2015  ? Metabolic syndrome 63/05/6008  ? ? ?Past Surgical History:  ?Procedure Laterality Date  ? CARDIAC CATHETERIZATION    ? COLONOSCOPY    ?  COLONOSCOPY WITH PROPOFOL N/A 12/03/2020  ? Procedure: COLONOSCOPY WITH PROPOFOL;  Surgeon: Dustin Lame, MD;  Location: [facility];  Service: Endoscopy;  Laterality: N/A;  ? HEMORRHOID BANDING  04/01/2012  ? Dr. Fleet Cobb  ? ? ?Family History  ?Problem Relation Age of Onset  ? Cancer Mother   ?     Thyroid  ? Emphysema Father   ? COPD Father   ? Hypertension Sister   ? Hypertension Brother   ? Heart disease Brother   ? Healthy Daughter   ? ? ?Social History  ? ?Tobacco Use  ? Smoking status: Former  ?  Packs/day: 1.00  ?  Years: 20.00  ?  Pack years: 20.00  ?  Types: Cigarettes  ?  Start date: 01/07/1975  ?  Quit date: 01/07/1995  ?  Years since quitting: 26.6  ? Smokeless tobacco: Never  ? Tobacco comments:  ?  smoking cessation materials not required  ?Substance Use Topics  ? Alcohol use: No  ? ? ? ?Current Outpatient Medications:  ?  augmented betamethasone dipropionate (DIPROLENE-AF) 0.05 % ointment, 1(one) application(s) topical 2(two) times a day, Disp: 50 g, Rfl: 0 ?  Cholecalciferol (VITAMIN D3) 1000 UNITS CAPS, Take 1 capsule by mouth daily., Disp: , Rfl:  ?  famotidine (PEPCID) 40 MG tablet, TAKE 1 TABLET BY MOUTH 2 TIMES DAILY., Disp: 180 tablet, Rfl: 1 ?  ferrous sulfate 325 (65 FE) MG tablet, Take 1 tablet by mouth daily., Disp: , Rfl:  ?  fexofenadine (ALLEGRA) 180 MG tablet, Take 180 mg by mouth daily., Disp: , Rfl:  ?  fluticasone (FLONASE) 50 MCG/ACT nasal spray, Place 2 sprays into both nostrils daily., Disp: 16 g, Rfl: 5 ?  omeprazole (PRILOSEC) 40 MG capsule, TAKE 1 CAPSULE BY MOUTH DAILY., Disp: 90 capsule, Rfl: 1 ?  rosuvastatin (CRESTOR) 20 MG tablet, TAKE 1 TABLET BY MOUTH DAILY., Disp: 90 tablet, Rfl: 1 ?  temazepam (RESTORIL) 15 MG capsule, Take 1 capsule (15 mg total) by mouth at bedtime as needed for sleep., Disp: 90 capsule, Rfl: 0 ? ?No Known Allergies ? ?I personally reviewed active problem list, medication list, allergies, family history, social history, health maintenance with  the patient/caregiver today. ? ? ?ROS ? ?Constitutional: Negative for fever or weight change.  ?Respiratory: Negative for cough and shortness of breath.   ?Cardiovascular: Negative for chest pain or palpitations.  ?Gastrointestinal: Negative for abdominal pain, no bowel changes.  ?Musculoskeletal: Negative for gait problem or joint swelling.  ?Skin: Negative for rash.  ?Neurological: Negative for dizziness or headache.  ?No other specific complaints in a complete review of systems (except as listed in HPI above).  ? ?Objective ? ?Vitals:  ? 08/23/21 1339  ?BP: 132/82  ?Pulse: 77  ?Resp:  16  ?SpO2: 95%  ?Weight: 205 lb (93 kg)  ?Height: '5\' 8"'$  (1.727 m)  ? ? ?Body mass index is 31.17 kg/m?. ? ?Physical Exam ? ?Constitutional: Patient appears well-developed and well-nourished. Obese  No distress.  ?HEENT: head atraumatic, normocephalic, pupils equal and reactive to light, neck supple ?Cardiovascular: Normal rate, regular rhythm and normal heart sounds.  No murmur heard. No BLE edema. ?Pulmonary/Chest: Effort normal and breath sounds normal. No respiratory distress. ?Abdominal: Soft.  There is no tenderness. ?Psychiatric: Patient has a normal mood and affect. behavior is normal. Judgment and thought content normal.  ? ? ?PHQ2/9: ? ?  08/23/2021  ?  1:46 PM 08/23/2021  ?  1:38 PM 02/23/2021  ?  8:34 AM 08/23/2020  ?  7:53 AM 08/17/2020  ?  1:35 PM  ?Depression screen PHQ 2/9  ?Decreased Interest 0 0 0 0 0  ?Down, Depressed, Hopeless 0 0 0 0 0  ?PHQ - 2 Score 0 0 0 0 0  ?Altered sleeping 0 0  0   ?Tired, decreased energy 0 0  0   ?Change in appetite 0 0  0   ?Feeling bad or failure about yourself  0 0  0   ?Trouble concentrating 0 0  0   ?Moving slowly or fidgety/restless 0 0  0   ?Suicidal thoughts 0 0  0   ?PHQ-9 Score 0 0  0   ?  ?phq 9 is negative ? ? ?Fall Risk: ? ?  08/23/2021  ?  1:48 PM 08/23/2021  ?  1:38 PM 02/23/2021  ?  8:34 AM 08/23/2020  ?  7:53 AM 08/17/2020  ?  1:39 PM  ?Fall Risk   ?Falls in the past year? 0 0 0 0  0  ?Number falls in past yr: 0 0 0 0 0  ?Injury with Fall? 0 0 0 0 0  ?Risk for fall due to : No Fall Risks No Fall Risks No Fall Risks  No Fall Risks  ?Follow up Falls prevention discussed Falls preven

## 2021-08-23 ENCOUNTER — Ambulatory Visit (INDEPENDENT_AMBULATORY_CARE_PROVIDER_SITE_OTHER): Payer: PPO

## 2021-08-23 ENCOUNTER — Other Ambulatory Visit: Payer: Self-pay

## 2021-08-23 ENCOUNTER — Ambulatory Visit (INDEPENDENT_AMBULATORY_CARE_PROVIDER_SITE_OTHER): Payer: PPO | Admitting: Family Medicine

## 2021-08-23 ENCOUNTER — Encounter: Payer: Self-pay | Admitting: Family Medicine

## 2021-08-23 VITALS — BP 132/82 | HR 77 | Resp 16 | Ht 68.0 in | Wt 205.0 lb

## 2021-08-23 VITALS — BP 132/82 | HR 77 | Temp 98.4°F | Resp 16 | Ht 68.0 in | Wt 205.2 lb

## 2021-08-23 DIAGNOSIS — D352 Benign neoplasm of pituitary gland: Secondary | ICD-10-CM

## 2021-08-23 DIAGNOSIS — Z9989 Dependence on other enabling machines and devices: Secondary | ICD-10-CM

## 2021-08-23 DIAGNOSIS — I714 Abdominal aortic aneurysm, without rupture, unspecified: Secondary | ICD-10-CM | POA: Insufficient documentation

## 2021-08-23 DIAGNOSIS — J3089 Other allergic rhinitis: Secondary | ICD-10-CM | POA: Diagnosis not present

## 2021-08-23 DIAGNOSIS — Z0000 Encounter for general adult medical examination without abnormal findings: Secondary | ICD-10-CM | POA: Diagnosis not present

## 2021-08-23 DIAGNOSIS — G4733 Obstructive sleep apnea (adult) (pediatric): Secondary | ICD-10-CM | POA: Diagnosis not present

## 2021-08-23 DIAGNOSIS — K219 Gastro-esophageal reflux disease without esophagitis: Secondary | ICD-10-CM

## 2021-08-23 DIAGNOSIS — E785 Hyperlipidemia, unspecified: Secondary | ICD-10-CM | POA: Diagnosis not present

## 2021-08-23 DIAGNOSIS — J302 Other seasonal allergic rhinitis: Secondary | ICD-10-CM

## 2021-08-23 DIAGNOSIS — I719 Aortic aneurysm of unspecified site, without rupture: Secondary | ICD-10-CM | POA: Diagnosis not present

## 2021-08-23 DIAGNOSIS — E8881 Metabolic syndrome: Secondary | ICD-10-CM

## 2021-08-23 DIAGNOSIS — E038 Other specified hypothyroidism: Secondary | ICD-10-CM

## 2021-08-23 DIAGNOSIS — E559 Vitamin D deficiency, unspecified: Secondary | ICD-10-CM | POA: Diagnosis not present

## 2021-08-23 DIAGNOSIS — F5101 Primary insomnia: Secondary | ICD-10-CM

## 2021-08-23 MED ORDER — OMEPRAZOLE 40 MG PO CPDR
DELAYED_RELEASE_CAPSULE | Freq: Every day | ORAL | 1 refills | Status: DC
  Filled 2021-08-23 – 2021-10-27 (×2): qty 90, 90d supply, fill #0

## 2021-08-23 MED ORDER — AZELASTINE HCL 0.1 % NA SOLN
1.0000 | Freq: Two times a day (BID) | NASAL | 12 refills | Status: DC
  Filled 2021-08-23: qty 30, 50d supply, fill #0
  Filled 2022-01-10: qty 30, 50d supply, fill #1

## 2021-08-23 MED ORDER — TEMAZEPAM 15 MG PO CAPS
15.0000 mg | ORAL_CAPSULE | Freq: Every evening | ORAL | 1 refills | Status: DC | PRN
  Filled 2021-08-23: qty 90, 90d supply, fill #0
  Filled 2021-11-21: qty 90, 90d supply, fill #1

## 2021-08-23 MED ORDER — ROSUVASTATIN CALCIUM 20 MG PO TABS
ORAL_TABLET | Freq: Every day | ORAL | 1 refills | Status: DC
  Filled 2021-08-23 – 2021-10-12 (×2): qty 90, 90d supply, fill #0
  Filled 2022-01-10: qty 90, 90d supply, fill #1

## 2021-08-23 NOTE — Patient Instructions (Signed)
Dustin Cobb , ?Thank you for taking time to come for your Medicare Wellness Visit. I appreciate your ongoing commitment to your health goals. Please review the following plan we discussed and let me know if I can assist you in the future.  ? ?Screening recommendations/referrals: ?Colonoscopy: no longer required ?Recommended yearly ophthalmology/optometry visit for glaucoma screening and checkup ?Recommended yearly dental visit for hygiene and checkup ? ?Vaccinations: ?Influenza vaccine: done 02/23/21 ?Pneumococcal vaccine: done 01/07/15 ?Tdap vaccine: done 08/23/20 ?Shingles vaccine: Shingrix discussed. Please contact your pharmacy for coverage information.  ?Covid-19:  done 07/21/19, 08/19/19 & 05/04/20 ? ?Advanced directives: Please bring a copy of your health care power of attorney and living will to the office at your convenience once you have completed those documents.  ? ?Conditions/risks identified: Recommend increasing physical activity to at least 3 days per week  ? ?Next appointment: Follow up in one year for your annual wellness visit.  ? ?Preventive Care 75 Years and Older, Male ?Preventive care refers to lifestyle choices and visits with your health care provider that can promote health and wellness. ?What does preventive care include? ?A yearly physical exam. This is also called an annual well check. ?Dental exams once or twice a year. ?Routine eye exams. Ask your health care provider how often you should have your eyes checked. ?Personal lifestyle choices, including: ?Daily care of your teeth and gums. ?Regular physical activity. ?Eating a healthy diet. ?Avoiding tobacco and drug use. ?Limiting alcohol use. ?Practicing safe sex. ?Taking low doses of aspirin every day. ?Taking vitamin and mineral supplements as recommended by your health care provider. ?What happens during an annual well check? ?The services and screenings done by your health care provider during your annual well check will depend on your  age, overall health, lifestyle risk factors, and family history of disease. ?Counseling  ?Your health care provider may ask you questions about your: ?Alcohol use. ?Tobacco use. ?Drug use. ?Emotional well-being. ?Home and relationship well-being. ?Sexual activity. ?Eating habits. ?History of falls. ?Memory and ability to understand (cognition). ?Work and work Statistician. ?Screening  ?You may have the following tests or measurements: ?Height, weight, and BMI. ?Blood pressure. ?Lipid and cholesterol levels. These may be checked every 5 years, or more frequently if you are over 71 years old. ?Skin check. ?Lung cancer screening. You may have this screening every year starting at age 73 if you have a 30-pack-year history of smoking and currently smoke or have quit within the past 15 years. ?Fecal occult blood test (FOBT) of the stool. You may have this test every year starting at age 34. ?Flexible sigmoidoscopy or colonoscopy. You may have a sigmoidoscopy every 5 years or a colonoscopy every 10 years starting at age 76. ?Prostate cancer screening. Recommendations will vary depending on your family history and other risks. ?Hepatitis C blood test. ?Hepatitis B blood test. ?Sexually transmitted disease (STD) testing. ?Diabetes screening. This is done by checking your blood sugar (glucose) after you have not eaten for a while (fasting). You may have this done every 1-3 years. ?Abdominal aortic aneurysm (AAA) screening. You may need this if you are a current or former smoker. ?Osteoporosis. You may be screened starting at age 6 if you are at high risk. ?Talk with your health care provider about your test results, treatment options, and if necessary, the need for more tests. ?Vaccines  ?Your health care provider may recommend certain vaccines, such as: ?Influenza vaccine. This is recommended every year. ?Tetanus, diphtheria, and acellular pertussis (Tdap, Td)  vaccine. You may need a Td booster every 10 years. ?Zoster  vaccine. You may need this after age 73. ?Pneumococcal 13-valent conjugate (PCV13) vaccine. One dose is recommended after age 83. ?Pneumococcal polysaccharide (PPSV23) vaccine. One dose is recommended after age 86. ?Talk to your health care provider about which screenings and vaccines you need and how often you need them. ?This information is not intended to replace advice given to you by your health care provider. Make sure you discuss any questions you have with your health care provider. ?Document Released: 06/11/2015 Document Revised: 02/02/2016 Document Reviewed: 03/16/2015 ?Elsevier Interactive Patient Education ? 2017 [place]. ? ?Fall Prevention in the Home ?Falls can cause injuries. They can happen to people of all ages. There are many things you can do to make your home safe and to help prevent falls. ?What can I do on the outside of my home? ?Regularly fix the edges of walkways and driveways and fix any cracks. ?Remove anything that might make you trip as you walk through a door, such as a raised step or threshold. ?Trim any bushes or trees on the path to your home. ?Use bright outdoor lighting. ?Clear any walking paths of anything that might make someone trip, such as rocks or tools. ?Regularly check to see if handrails are loose or broken. Make sure that both sides of any steps have handrails. ?Any raised decks and porches should have guardrails on the edges. ?Have any leaves, snow, or ice cleared regularly. ?Use sand or salt on walking paths during winter. ?Clean up any spills in your garage right away. This includes oil or grease spills. ?What can I do in the bathroom? ?Use night lights. ?Install grab bars by the toilet and in the tub and shower. Do not use towel bars as grab bars. ?Use non-skid mats or decals in the tub or shower. ?If you need to sit down in the shower, use a plastic, non-slip stool. ?Keep the floor dry. Clean up any water that spills on the floor as soon as it happens. ?Remove  soap buildup in the tub or shower regularly. ?Attach bath mats securely with double-sided non-slip rug tape. ?Do not have throw rugs and other things on the floor that can make you trip. ?What can I do in the bedroom? ?Use night lights. ?Make sure that you have a light by your bed that is easy to reach. ?Do not use any sheets or blankets that are too big for your bed. They should not hang down onto the floor. ?Have a firm chair that has side arms. You can use this for support while you get dressed. ?Do not have throw rugs and other things on the floor that can make you trip. ?What can I do in the kitchen? ?Clean up any spills right away. ?Avoid walking on wet floors. ?Keep items that you use a lot in easy-to-reach places. ?If you need to reach something above you, use a strong step stool that has a grab bar. ?Keep electrical cords out of the way. ?Do not use floor polish or wax that makes floors slippery. If you must use wax, use non-skid floor wax. ?Do not have throw rugs and other things on the floor that can make you trip. ?What can I do with my stairs? ?Do not leave any items on the stairs. ?Make sure that there are handrails on both sides of the stairs and use them. Fix handrails that are broken or loose. Make sure that handrails are as long  as the stairways. ?Check any carpeting to make sure that it is firmly attached to the stairs. Fix any carpet that is loose or worn. ?Avoid having throw rugs at the top or bottom of the stairs. If you do have throw rugs, attach them to the floor with carpet tape. ?Make sure that you have a light switch at the top of the stairs and the bottom of the stairs. If you do not have them, ask someone to add them for you. ?What else can I do to help prevent falls? ?Wear shoes that: ?Do not have high heels. ?Have rubber bottoms. ?Are comfortable and fit you well. ?Are closed at the toe. Do not wear sandals. ?If you use a stepladder: ?Make sure that it is fully opened. Do not climb a  closed stepladder. ?Make sure that both sides of the stepladder are locked into place. ?Ask someone to hold it for you, if possible. ?Clearly mark and make sure that you can see: ?Any grab bars or handrails. ?F

## 2021-08-23 NOTE — Progress Notes (Signed)
? ?Subjective:  ? Dustin Cobb is a 76 y.o. male who presents for Medicare Annual/Subsequent preventive examination. ? ?Review of Systems    ? ?Cardiac Risk Factors include: advanced age (>74mn, >>22women);dyslipidemia;obesity (BMI >30kg/m2);male gender ? ?   ?Objective:  ?  ?Today's Vitals  ? 08/23/21 1339  ?BP: 132/82  ?Pulse: 77  ?Resp: 16  ?Temp: 98.4 ?F (36.9 ?C)  ?TempSrc: Oral  ?SpO2: 95%  ?Weight: 205 lb 3.2 oz (93.1 kg)  ?Height: '5\' 8"'$  (1.727 m)  ? ?Body mass index is 31.2 kg/m?. ? ? ?  08/23/2021  ?  1:47 PM 08/17/2020  ?  1:37 PM 08/14/2019  ?  1:50 PM 08/13/2018  ?  1:41 PM 08/07/2017  ?  1:58 PM 02/08/2017  ?  8:06 AM 08/10/2016  ? 10:18 AM  ?Advanced Directives  ?Does Patient Have a Medical Advance Directive? No No No No No No No  ?Would patient like information on creating a medical advance directive? No - Patient declined Yes (MAU/Ambulatory/Procedural Areas - Information given) No - Patient declined Yes (MAU/Ambulatory/Procedural Areas - Information given) Yes (MAU/Ambulatory/Procedural Areas - Information given)    ? ? ?Current Medications (verified) ?Outpatient Encounter Medications as of 08/23/2021  ?Medication Sig  ? augmented betamethasone dipropionate (DIPROLENE-AF) 0.05 % ointment 1(one) application(s) topical 2(two) times a day  ? Cholecalciferol (VITAMIN D3) 1000 UNITS CAPS Take 1 capsule by mouth daily.  ? famotidine (PEPCID) 40 MG tablet TAKE 1 TABLET BY MOUTH 2 TIMES DAILY.  ? ferrous sulfate 325 (65 FE) MG tablet Take 1 tablet by mouth daily.  ? fexofenadine (ALLEGRA) 180 MG tablet Take 180 mg by mouth daily.  ? fluticasone (FLONASE) 50 MCG/ACT nasal spray Place 2 sprays into both nostrils daily.  ? omeprazole (PRILOSEC) 40 MG capsule TAKE 1 CAPSULE BY MOUTH DAILY.  ? rosuvastatin (CRESTOR) 20 MG tablet TAKE 1 TABLET BY MOUTH DAILY.  ? temazepam (RESTORIL) 15 MG capsule Take 1 capsule (15 mg total) by mouth at bedtime as needed for sleep.  ? ?No facility-administered encounter  medications on file as of 08/23/2021.  ? ? ?Allergies (verified) ?Patient has no known allergies.  ? ?History: ?Past Medical History:  ?Diagnosis Date  ? Aching headache   ? Allergy   ? ED (erectile dysfunction)   ? GERD (gastroesophageal reflux disease)   ? Hyperlipidemia   ? Hypertension   ? Insomnia   ? Metabolic syndrome   ? Onychomycosis   ? OSA on CPAP   ? Postural dizziness   ? Rash   ? Sleep apnea   ? Wears partial dentures   ? ?Past Surgical History:  ?Procedure Laterality Date  ? CARDIAC CATHETERIZATION    ? COLONOSCOPY    ? COLONOSCOPY WITH PROPOFOL N/A 12/03/2020  ? Procedure: COLONOSCOPY WITH PROPOFOL;  Surgeon: WLucilla Lame MD;  Location: [facility]  Service: Endoscopy;  Laterality: N/A;  ? HEMORRHOID BANDING  04/01/2012  ? Dr. BFleet Contras ? ?Family History  ?Problem Relation Age of Onset  ? Cancer Mother   ?     Thyroid  ? Emphysema Father   ? COPD Father   ? Hypertension Sister   ? Hypertension Brother   ? Heart disease Brother   ? Healthy Daughter   ? ?Social History  ? ?Socioeconomic History  ? Marital status: Married  ?  Spouse name: Mary  ? Number of children: 2  ? Years of education: Not on file  ? Highest education level: Associate degree:  occupational, Hotel manager, or vocational program  ?Occupational History  ? Occupation: retired  ?Tobacco Use  ? Smoking status: Former  ?  Packs/day: 1.00  ?  Years: 20.00  ?  Pack years: 20.00  ?  Types: Cigarettes  ?  Start date: 01/07/1975  ?  Quit date: 01/07/1995  ?  Years since quitting: 26.6  ? Smokeless tobacco: Never  ? Tobacco comments:  ?  smoking cessation materials not required  ?Vaping Use  ? Vaping Use: Never used  ?Substance and Sexual Activity  ? Alcohol use: No  ? Drug use: No  ? Sexual activity: Yes  ?  Partners: Female  ?  Birth control/protection: None  ?Other Topics Concern  ? Not on file  ?Social History Narrative  ? Not on file  ? ?Social Determinants of Health  ? ?Financial Resource Strain: Low Risk   ? Difficulty of Paying Living  Expenses: Not hard at all  ?Food Insecurity: No Food Insecurity  ? Worried About Charity fundraiser in the Last Year: Never true  ? Ran Out of Food in the Last Year: Never true  ?Transportation Needs: No Transportation Needs  ? Lack of Transportation (Medical): No  ? Lack of Transportation (Non-Medical): No  ?Physical Activity: Inactive  ? Days of Exercise per Week: 0 days  ? Minutes of Exercise per Session: 0 min  ?Stress: No Stress Concern Present  ? Feeling of Stress : Only a little  ?Social Connections: Socially Integrated  ? Frequency of Communication with Friends and Family: More than three times a week  ? Frequency of Social Gatherings with Friends and Family: Once a week  ? Attends Religious Services: More than 4 times per year  ? Active Member of Clubs or Organizations: Yes  ? Attends Archivist Meetings: More than 4 times per year  ? Marital Status: Married  ? ? ?Tobacco Counseling ?Counseling given: Not Answered ?Tobacco comments: smoking cessation materials not required ? ? ?Clinical Intake: ? ?Pre-visit preparation completed: Yes ? ?Pain : No/denies pain ? ?  ? ?BMI - recorded: 31.2 ?Nutritional Status: BMI > 30  Obese ?Nutritional Risks: None ?Diabetes: No ? ?How often do you need to have someone help you when you read instructions, pamphlets, or other written materials from your doctor or pharmacy?: 1 - Never ? ? ?Interpreter Needed?: No ? ?Information entered by :: Clemetine Marker LPN ? ? ?Activities of Daily Living ? ?  08/23/2021  ?  1:48 PM 08/23/2021  ?  1:39 PM  ?In your present state of health, do you have any difficulty performing the following activities:  ?Hearing? 1 0  ?Vision? 0 0  ?Difficulty concentrating or making decisions? 0 0  ?Walking or climbing stairs? 0 0  ?Dressing or bathing? 0 0  ?Doing errands, shopping? 0 0  ?Preparing Food and eating ? N   ?Using the Toilet? N   ?In the past six months, have you accidently leaked urine? N   ?Do you have problems with loss of bowel  control? N   ?Managing your Medications? N   ?Managing your Finances? N   ?Housekeeping or managing your Housekeeping? N   ? ? ?Patient Care Team: ?[name], MD as PCP - General (Family Medicine) ?Deetta Perla, MD as Consulting Physician (Neurosurgery) ?Lucilla Lame, MD as Consulting Physician (Gastroenterology) ?Ree Edman, MD (Dermatology) ?Allyne Gee, MD as Consulting Physician (Internal Medicine) ?Beverly Gust, MD (Otolaryngology) ? ?Indicate any recent Medical Services you may have received  from other than [facility] providers in the past year (date may be approximate). ? ?   ?Assessment:  ? This is a routine wellness examination for Mendel. ? ?Hearing/Vision screen ?Hearing Screening - Comments:: Pt states hearing loss in right ear; declines hearing aid at this time ?Vision Screening - Comments:: Annual vision screenings with Dr. Ellin Mayhew  ? ?Dietary issues and exercise activities discussed: ?Current Exercise Habits: The patient does not participate in regular exercise at present, Exercise limited by: None identified ? ? Goals Addressed   ? ?  ?  ?  ?  ? This Visit's Progress  ?  DIET - INCREASE WATER INTAKE   On track  ?  Recommend to drink at least 6-8 8oz glasses of water per day. ?  ? ?  ? ?Depression Screen ? ?  08/23/2021  ?  1:46 PM 08/23/2021  ?  1:38 PM 02/23/2021  ?  8:34 AM 08/23/2020  ?  7:53 AM 08/17/2020  ?  1:35 PM 02/18/2020  ?  8:28 AM 11/19/2019  ?  2:23 PM  ?PHQ 2/9 Scores  ?PHQ - 2 Score 0 0 0 0 0 0 0  ?PHQ- 9 Score 0 0  0     ?  ?Fall Risk ? ?  08/23/2021  ?  1:48 PM 08/23/2021  ?  1:38 PM 02/23/2021  ?  8:34 AM 08/23/2020  ?  7:53 AM 08/17/2020  ?  1:39 PM  ?Fall Risk   ?Falls in the past year? 0 0 0 0 0  ?Number falls in past yr: 0 0 0 0 0  ?Injury with Fall? 0 0 0 0 0  ?Risk for fall due to : No Fall Risks No Fall Risks No Fall Risks  No Fall Risks  ?Follow up Falls prevention discussed Falls prevention discussed Falls prevention discussed  Falls prevention discussed  ? ? ?FALL  RISK PREVENTION PERTAINING TO THE HOME: ? ?Any stairs in or around the home? Yes  ?If so, are there any without handrails? No  ?Home free of loose throw rugs in walkways, pet beds, electrical cords, etc? Yes  ?Adequat

## 2021-08-29 ENCOUNTER — Telehealth: Payer: Self-pay

## 2021-08-29 NOTE — Telephone Encounter (Signed)
Scheduled in person visit ?

## 2021-09-19 ENCOUNTER — Ambulatory Visit
Admission: RE | Admit: 2021-09-19 | Discharge: 2021-09-19 | Disposition: A | Discharge status: Home / Self Care | Payer: PPO | Source: Ambulatory Visit | Attending: Family Medicine | Admitting: Family Medicine

## 2021-09-19 DIAGNOSIS — D352 Benign neoplasm of pituitary gland: Secondary | ICD-10-CM | POA: Diagnosis not present

## 2021-09-19 DIAGNOSIS — J3489 Other specified disorders of nose and nasal sinuses: Secondary | ICD-10-CM | POA: Diagnosis not present

## 2021-09-19 DIAGNOSIS — E237 Disorder of pituitary gland, unspecified: Secondary | ICD-10-CM | POA: Diagnosis not present

## 2021-09-19 IMAGING — MR MR HEAD WO/W CM
13 of 20 series · 25 of 48 positions shown · IV contrast (gadavist)
Comparison: MRI of the brain [DATE].

CLINICAL DATA: Pituitary microadenoma (HCC) [JD] ([JD]-CM).

EXAM:
MRI HEAD WITHOUT AND WITH CONTRAST
TECHNIQUE: Multiplanar, multiecho pulse sequences of the brain and surrounding
structures were obtained without and with intravenous contrast.
CONTRAST:  9mL GADAVIST GADOBUTROL 1 MMOL/ML IV SOLN

[Series 5: T1 · sagittal · 5.0mm · 0.62mm/px · 1 of 25 slices shown]
[im 1/25]
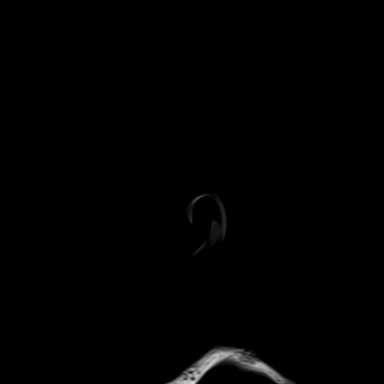

[Series 8: T2 · axial · 5.0mm · 0.53mm/px · z∈[-94,+50]mm · 2 of 25 slices shown (1 of 2)]
[im 1/25]
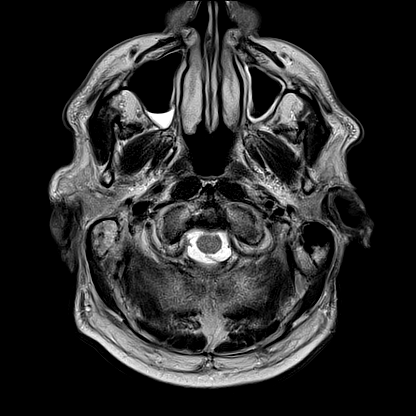
[im 25/25]
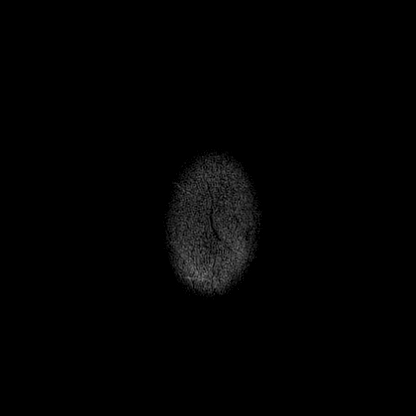

[Series 10: FLAIR · axial · 3.0mm · 0.53mm/px · z∈[-103,+59]mm · 5 of 55 slices shown]
[im 1/55]
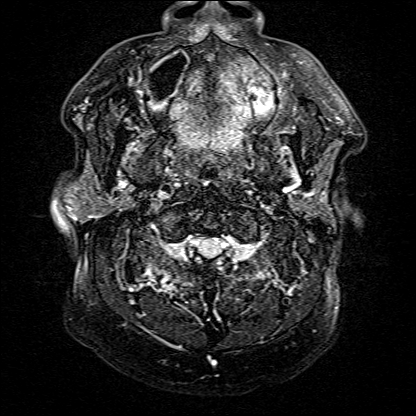
[im 14/55]
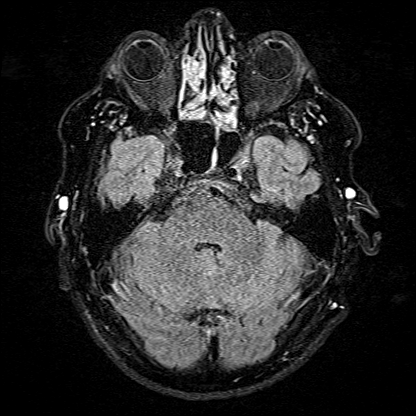
[im 28/55]
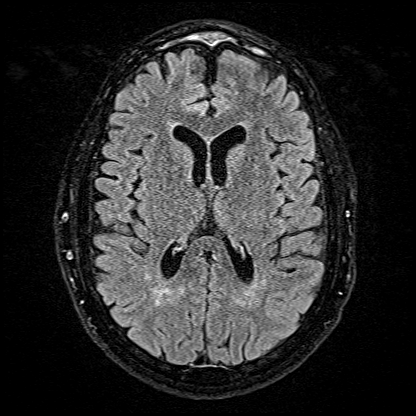
[im 41/55]
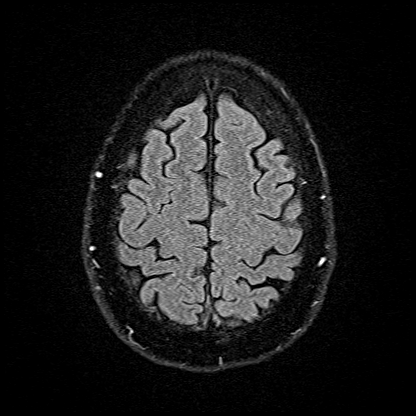
[im 55/55]
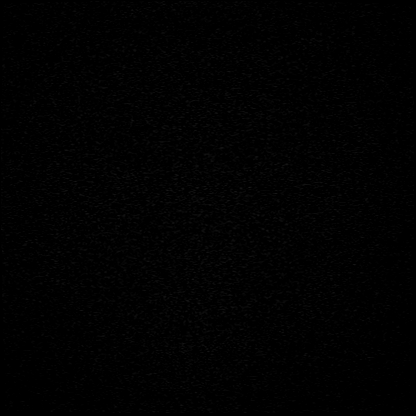

[Series 14: T2 · coronal · 3.0mm · 0.42mm/px · 1 of 13 slices shown (2 of 2)]
[im 1/13]
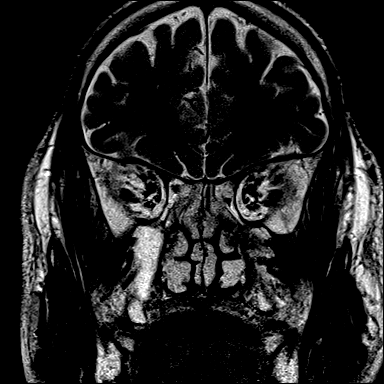

[Series 16: T1 post-contrast · coronal · 3.0mm · 0.28mm/px · 1 of 11 slices shown (1 of 9)]
[im 1/11]
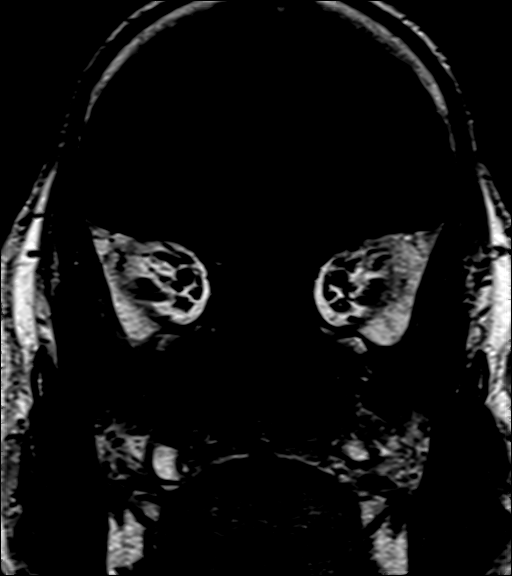

[Series 17: T1 post-contrast · coronal · 3.0mm · 0.28mm/px · 1 of 11 slices shown (2 of 9)]
[im 1/11]
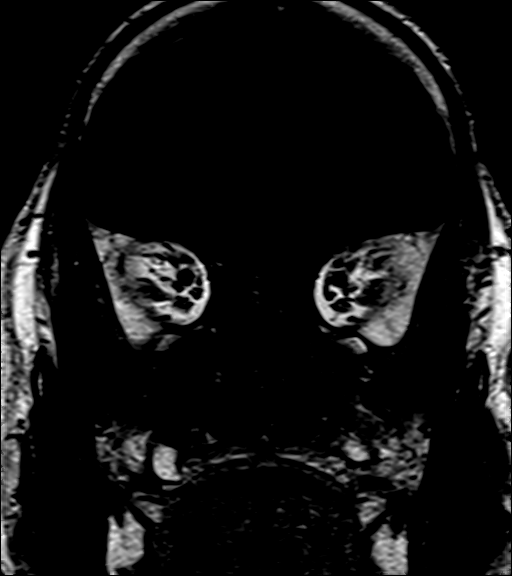

[Series 18: T1 post-contrast · coronal · 3.0mm · 0.28mm/px · 1 of 11 slices shown (3 of 9)]
[im 1/11]
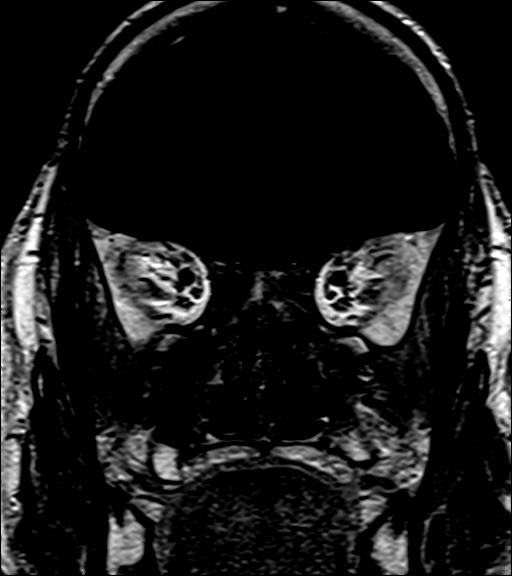

[Series 19: T1 post-contrast · coronal · 3.0mm · 0.28mm/px · 1 of 11 slices shown (4 of 9)]
[im 1/11]
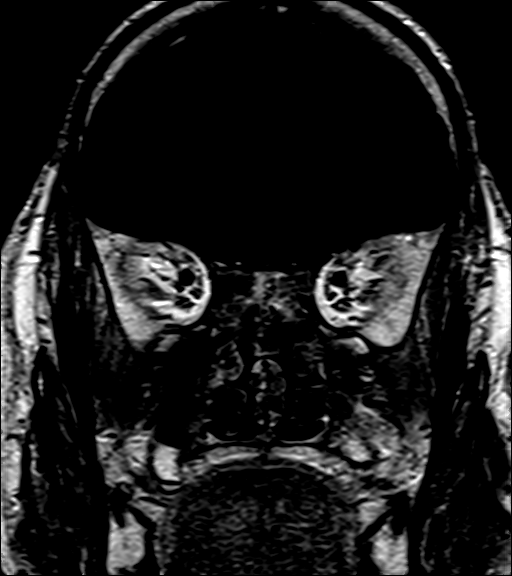

[Series 20: T1 post-contrast · coronal · 3.0mm · 0.28mm/px · 1 of 11 slices shown (5 of 9)]
[im 1/11]
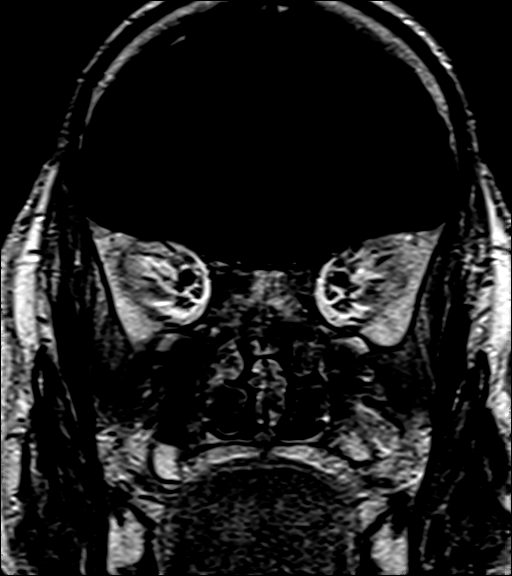

[Series 21: T1 post-contrast · coronal · 3.0mm · 0.28mm/px · 1 of 11 slices shown (6 of 9)]
[im 1/11]
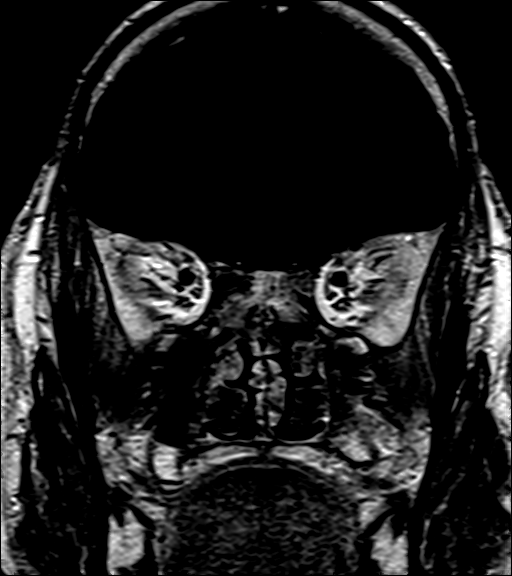

[Series 22: T1 post-contrast · coronal · 3.0mm · 0.21mm/px · 1 of 13 slices shown (7 of 9)]
[im 1/13]
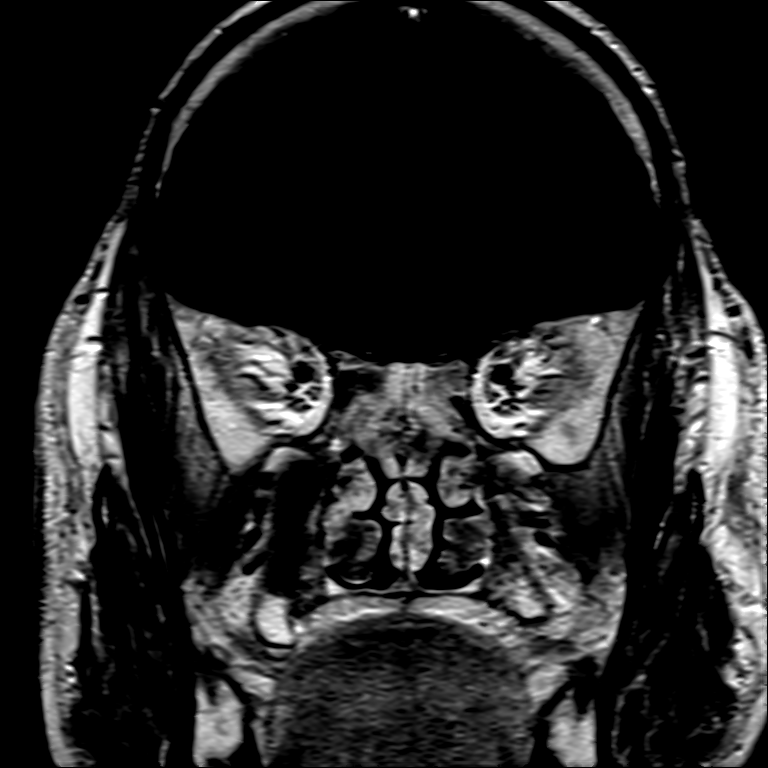

[Series 23: T1 post-contrast · sagittal · 3.0mm · 0.21mm/px · 1 of 13 slices shown (8 of 9)]
[im 1/13]
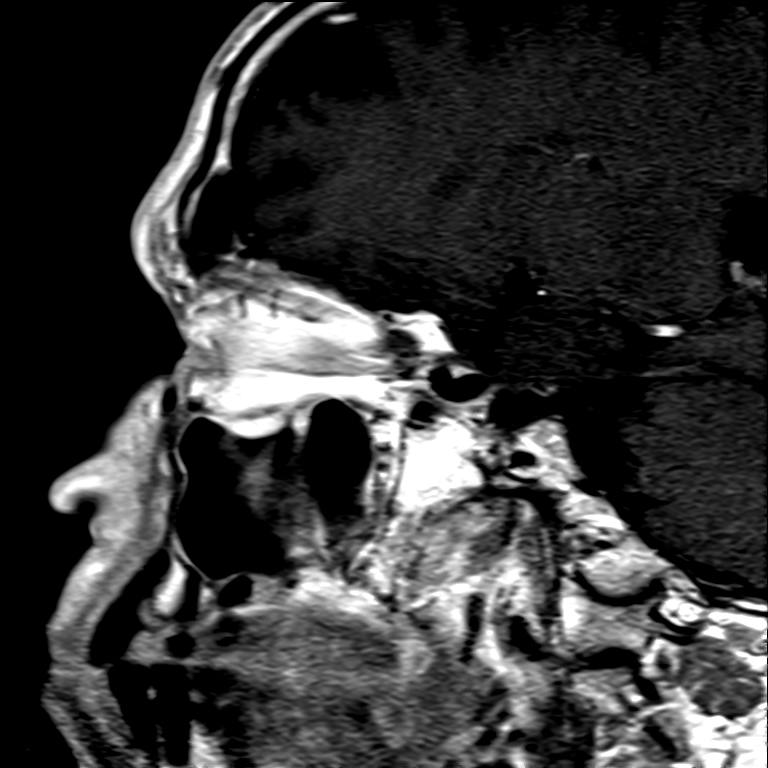

[Series 24: T1 post-contrast · axial · 1.0mm · 0.98mm/px · z∈[-116,+59]mm · 8 of 176 slices shown (9 of 9)]
[im 1/176]
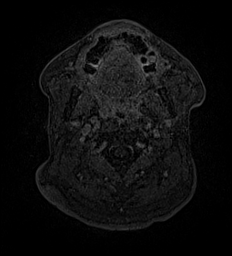
[im 26/176]
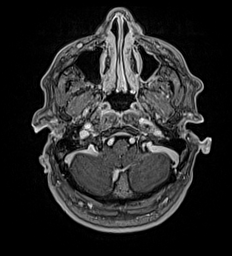
[im 51/176]
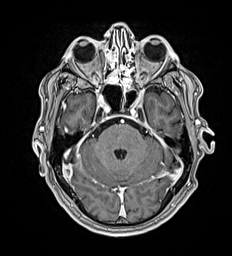
[im 76/176]
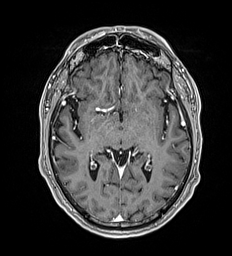
[im 101/176]
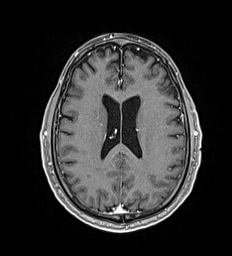
[im 126/176]
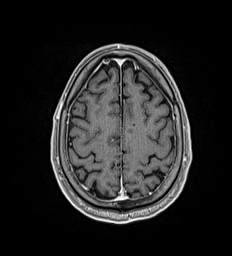
[im 151/176]
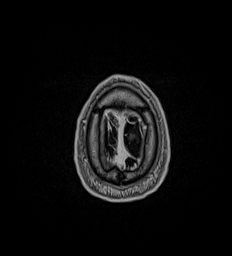
[im 176/176]
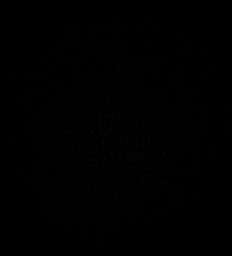

[25 of 48 positions shown; findings below may reference images not displayed]

FINDINGS: Brain: No acute infarction, hemorrhage, hydrocephalus, or
extra-axial collection. Scattered foci of T2 hyperintensity within
the white matter of the cerebral hemispheres, nonspecific, most
likely related to chronic small vessel ischemia.

Pituitary/Sella: A 3 mm subtle area of hypoenhancement with central
hyperenhancing focus within the pituitary gland at midline, only
demonstrated on postcontrast coronal images (series 22, image 6).
The infundibulum is midline. The hypothalamus and mamillary bodies
are normal. There is no mass effect on the optic chiasm or optic
nerves. The infundibular and chiasmatic recesses are clear. Normal
cavernous sinus and cavernous internal carotid artery flow voids.

Vascular: Normal flow voids.

Skull and upper cervical spine: Normal marrow signal.

Sinuses/Orbits: Prominent mucosal thickening the bilateral ethmoid
cells with mild mucosal thickening of the remainder paranasal
sinuses. Fluid level is seen in the right maxillary sinus. Correlate
clinically for acute sinusitis. The orbits are maintained.

Other: None.
IMPRESSION: 1. Equivocal 3 mm hypoenhancing focus only seen on coronal
postcontrast images within the pituitary gland at midline, may
represent a pituitary macro adenoma, Rathke's cleft cyst versus
artifact.
2. Mild chronic microvascular ischemic changes of the white matter.

## 2021-09-19 MED ORDER — GADOBUTROL 1 MMOL/ML IV SOLN
9.0000 mL | Freq: Once | INTRAVENOUS | Status: AC | PRN
  Administered 2021-09-19: 9 mL via INTRAVENOUS

## 2021-09-20 ENCOUNTER — Other Ambulatory Visit: Payer: Self-pay | Admitting: Family Medicine

## 2021-09-20 DIAGNOSIS — R9089 Other abnormal findings on diagnostic imaging of central nervous system: Secondary | ICD-10-CM

## 2021-09-22 ENCOUNTER — Other Ambulatory Visit: Payer: Self-pay | Admitting: Neurosurgery

## 2021-09-22 DIAGNOSIS — E237 Disorder of pituitary gland, unspecified: Secondary | ICD-10-CM

## 2021-10-12 ENCOUNTER — Other Ambulatory Visit: Payer: Self-pay

## 2021-10-28 ENCOUNTER — Other Ambulatory Visit: Payer: Self-pay

## 2021-11-22 ENCOUNTER — Other Ambulatory Visit: Payer: Self-pay

## 2021-11-22 DIAGNOSIS — H2513 Age-related nuclear cataract, bilateral: Secondary | ICD-10-CM | POA: Diagnosis not present

## 2021-11-22 DIAGNOSIS — H5203 Hypermetropia, bilateral: Secondary | ICD-10-CM | POA: Diagnosis not present

## 2021-11-22 DIAGNOSIS — H35373 Puckering of macula, bilateral: Secondary | ICD-10-CM | POA: Diagnosis not present

## 2021-12-27 ENCOUNTER — Encounter: Payer: Self-pay | Admitting: Gastroenterology

## 2021-12-27 ENCOUNTER — Other Ambulatory Visit: Payer: Self-pay

## 2021-12-27 ENCOUNTER — Ambulatory Visit: Payer: PPO | Admitting: Gastroenterology

## 2021-12-27 VITALS — BP 150/94 | HR 77 | Temp 98.2°F | Ht 68.0 in | Wt 207.0 lb

## 2021-12-27 DIAGNOSIS — K219 Gastro-esophageal reflux disease without esophagitis: Secondary | ICD-10-CM | POA: Diagnosis not present

## 2021-12-27 MED ORDER — PANTOPRAZOLE SODIUM 40 MG PO TBEC
40.0000 mg | DELAYED_RELEASE_TABLET | Freq: Every day | ORAL | 5 refills | Status: DC
  Filled 2021-12-27: qty 30, 30d supply, fill #0
  Filled 2022-01-23: qty 30, 30d supply, fill #1
  Filled 2022-02-22: qty 30, 30d supply, fill #2
  Filled 2022-03-22: qty 30, 30d supply, fill #3
  Filled 2022-04-20: qty 30, 30d supply, fill #4
  Filled 2022-05-23: qty 30, 30d supply, fill #5

## 2021-12-27 NOTE — Addendum Note (Signed)
Addended by: Lurlean Nanny on: 12/27/2021 01:58 PM   Modules accepted: Orders

## 2021-12-27 NOTE — H&P (View-Only) (Signed)
Primary Care Physician: [name], MD  Primary Gastroenterologist:  Dr. Lucilla Lame  Chief Complaint  Patient presents with   New Patient (Initial Visit)   Gastroesophageal Reflux    Taking Famotidine and Omeprazole    HPI: Dustin Cobb is a 76 y.o. male here with a history of reflux.  The patient states he is taking Tums every day and also takes Prilosec and Pepcid.  The patient had a colonoscopy by me within the last few years.  He reports that he does not have any unexplained weight loss fevers chills nausea vomiting black stools or bloody stools.  He also reports that he does better on the Tums but is needing to take it every day  Past Medical History:  Diagnosis Date   Aching headache    Allergy    ED (erectile dysfunction)    GERD (gastroesophageal reflux disease)    Hyperlipidemia    Hypertension    Insomnia    Metabolic syndrome    Onychomycosis    OSA on CPAP    Postural dizziness    Rash    Sleep apnea    Wears partial dentures     Current Outpatient Medications  Medication Sig Dispense Refill   augmented betamethasone dipropionate (DIPROLENE-AF) 0.05 % ointment 1(one) application(s) topical 2(two) times a day 50 g 0   azelastine (ASTELIN) 0.1 % nasal spray Place 1 spray into both nostrils 2 (two) times daily. Use in each nostril as directed 30 mL 12   Cholecalciferol (VITAMIN D3) 1000 UNITS CAPS Take 1 capsule by mouth daily.     ferrous sulfate 325 (65 FE) MG tablet Take 1 tablet by mouth daily.     fexofenadine (ALLEGRA) 180 MG tablet Take 180 mg by mouth daily.     fluticasone (FLONASE) 50 MCG/ACT nasal spray Place 2 sprays into both nostrils daily. 16 g 5   pantoprazole (PROTONIX) 40 MG tablet Take 1 tablet (40 mg total) by mouth daily. 30 tablet 5   rosuvastatin (CRESTOR) 20 MG tablet TAKE 1 TABLET BY MOUTH DAILY. 90 tablet 1   temazepam (RESTORIL) 15 MG capsule Take 1 capsule (15 mg total) by mouth at bedtime as needed for sleep. 90 capsule 1    No current facility-administered medications for this visit.    Allergies as of 12/27/2021   (No Known Allergies)    ROS:  General: Negative for anorexia, weight loss, fever, chills, fatigue, weakness. ENT: Negative for hoarseness, difficulty swallowing , nasal congestion. CV: Negative for chest pain, angina, palpitations, dyspnea on exertion, peripheral edema.  Respiratory: Negative for dyspnea at rest, dyspnea on exertion, cough, sputum, wheezing.  GI: See history of present illness. GU:  Negative for dysuria, hematuria, urinary incontinence, urinary frequency, nocturnal urination.  Endo: Negative for unusual weight change.    Physical Examination:   BP (!) 150/94   Pulse 77   Temp 98.2 F (36.8 C) (Oral)   Ht '5\' 8"'$  (1.727 m)   Wt 207 lb (93.9 kg)   BMI 31.47 kg/m   General: Well-nourished, well-developed in no acute distress.  Eyes: No icterus. Conjunctivae pink. Lungs: Clear to auscultation bilaterally. Non-labored. Heart: Regular rate and rhythm, no murmurs rubs or gallops.  Abdomen: Bowel sounds are normal, nontender, nondistended, no hepatosplenomegaly or masses, no abdominal bruits or hernia , no rebound or guarding.   Extremities: No lower extremity edema. No clubbing or deformities. Neuro: Alert and oriented x 3.  Grossly intact. Skin: Warm and dry, no  jaundice.   Psych: Alert and cooperative, normal mood and affect.  Labs:    Imaging Studies: No results found.  Assessment and Plan:   Dustin Cobb is a 76 y.o. y/o male who comes in with GERD symptoms not being helped with Pepcid and Prilosec and Tums.  The patient will be set up for an EGD to rule out any other pathology.  The patient will also stop the Pepcid and the Prilosec and will be started on a single dose of Protonix 40 mg a day.  The patient has been explained the plan and will follow-up at the time of the EGD.     Lucilla Lame, MD. Marval Regal    Note: This dictation was prepared with Dragon  dictation along with smaller phrase technology. Any transcriptional errors that result from this process are unintentional.

## 2021-12-27 NOTE — Progress Notes (Signed)
Primary Care Physician: [name], MD  Primary Gastroenterologist:  Dr. Lucilla Lame  Chief Complaint  Patient presents with   New Patient (Initial Visit)   Gastroesophageal Reflux    Taking Famotidine and Omeprazole    HPI: Dustin Cobb is a 76 y.o. male here with a history of reflux.  The patient states he is taking Tums every day and also takes Prilosec and Pepcid.  The patient had a colonoscopy by me within the last few years.  He reports that he does not have any unexplained weight loss fevers chills nausea vomiting black stools or bloody stools.  He also reports that he does better on the Tums but is needing to take it every day  Past Medical History:  Diagnosis Date   Aching headache    Allergy    ED (erectile dysfunction)    GERD (gastroesophageal reflux disease)    Hyperlipidemia    Hypertension    Insomnia    Metabolic syndrome    Onychomycosis    OSA on CPAP    Postural dizziness    Rash    Sleep apnea    Wears partial dentures     Current Outpatient Medications  Medication Sig Dispense Refill   augmented betamethasone dipropionate (DIPROLENE-AF) 0.05 % ointment 1(one) application(s) topical 2(two) times a day 50 g 0   azelastine (ASTELIN) 0.1 % nasal spray Place 1 spray into both nostrils 2 (two) times daily. Use in each nostril as directed 30 mL 12   Cholecalciferol (VITAMIN D3) 1000 UNITS CAPS Take 1 capsule by mouth daily.     ferrous sulfate 325 (65 FE) MG tablet Take 1 tablet by mouth daily.     fexofenadine (ALLEGRA) 180 MG tablet Take 180 mg by mouth daily.     fluticasone (FLONASE) 50 MCG/ACT nasal spray Place 2 sprays into both nostrils daily. 16 g 5   pantoprazole (PROTONIX) 40 MG tablet Take 1 tablet (40 mg total) by mouth daily. 30 tablet 5   rosuvastatin (CRESTOR) 20 MG tablet TAKE 1 TABLET BY MOUTH DAILY. 90 tablet 1   temazepam (RESTORIL) 15 MG capsule Take 1 capsule (15 mg total) by mouth at bedtime as needed for sleep. 90 capsule 1    No current facility-administered medications for this visit.    Allergies as of 12/27/2021   (No Known Allergies)    ROS:  General: Negative for anorexia, weight loss, fever, chills, fatigue, weakness. ENT: Negative for hoarseness, difficulty swallowing , nasal congestion. CV: Negative for chest pain, angina, palpitations, dyspnea on exertion, peripheral edema.  Respiratory: Negative for dyspnea at rest, dyspnea on exertion, cough, sputum, wheezing.  GI: See history of present illness. GU:  Negative for dysuria, hematuria, urinary incontinence, urinary frequency, nocturnal urination.  Endo: Negative for unusual weight change.    Physical Examination:   BP (!) 150/94   Pulse 77   Temp 98.2 F (36.8 C) (Oral)   Ht '5\' 8"'$  (1.727 m)   Wt 207 lb (93.9 kg)   BMI 31.47 kg/m   General: Well-nourished, well-developed in no acute distress.  Eyes: No icterus. Conjunctivae pink. Lungs: Clear to auscultation bilaterally. Non-labored. Heart: Regular rate and rhythm, no murmurs rubs or gallops.  Abdomen: Bowel sounds are normal, nontender, nondistended, no hepatosplenomegaly or masses, no abdominal bruits or hernia , no rebound or guarding.   Extremities: No lower extremity edema. No clubbing or deformities. Neuro: Alert and oriented x 3.  Grossly intact. Skin: Warm and dry, no  jaundice.   Psych: Alert and cooperative, normal mood and affect.  Labs:    Imaging Studies: No results found.  Assessment and Plan:   Dustin Cobb is a 76 y.o. y/o male who comes in with GERD symptoms not being helped with Pepcid and Prilosec and Tums.  The patient will be set up for an EGD to rule out any other pathology.  The patient will also stop the Pepcid and the Prilosec and will be started on a single dose of Protonix 40 mg a day.  The patient has been explained the plan and will follow-up at the time of the EGD.     Lucilla Lame, MD. Marval Regal    Note: This dictation was prepared with Dragon  dictation along with smaller phrase technology. Any transcriptional errors that result from this process are unintentional.

## 2022-01-06 ENCOUNTER — Encounter: Payer: Self-pay | Admitting: Gastroenterology

## 2022-01-09 ENCOUNTER — Other Ambulatory Visit: Payer: Self-pay

## 2022-01-09 ENCOUNTER — Ambulatory Visit: Payer: PPO | Admitting: Anesthesiology

## 2022-01-09 ENCOUNTER — Encounter
Admission: RE | Disposition: A | Discharge status: Home / Self Care | Payer: Self-pay | Source: Ambulatory Visit | Attending: Gastroenterology

## 2022-01-09 ENCOUNTER — Ambulatory Visit
Admission: RE | Admit: 2022-01-09 | Discharge: 2022-01-09 | Disposition: A | Discharge status: Home / Self Care | Payer: PPO | Source: Ambulatory Visit | Attending: Gastroenterology | Admitting: Gastroenterology

## 2022-01-09 DIAGNOSIS — K3189 Other diseases of stomach and duodenum: Secondary | ICD-10-CM | POA: Diagnosis not present

## 2022-01-09 DIAGNOSIS — Z79899 Other long term (current) drug therapy: Secondary | ICD-10-CM | POA: Insufficient documentation

## 2022-01-09 DIAGNOSIS — K2970 Gastritis, unspecified, without bleeding: Secondary | ICD-10-CM | POA: Diagnosis not present

## 2022-01-09 DIAGNOSIS — G4730 Sleep apnea, unspecified: Secondary | ICD-10-CM | POA: Insufficient documentation

## 2022-01-09 DIAGNOSIS — Z87891 Personal history of nicotine dependence: Secondary | ICD-10-CM | POA: Insufficient documentation

## 2022-01-09 DIAGNOSIS — K219 Gastro-esophageal reflux disease without esophagitis: Secondary | ICD-10-CM | POA: Insufficient documentation

## 2022-01-09 DIAGNOSIS — K449 Diaphragmatic hernia without obstruction or gangrene: Secondary | ICD-10-CM | POA: Diagnosis not present

## 2022-01-09 HISTORY — PX: ESOPHAGOGASTRODUODENOSCOPY (EGD) WITH PROPOFOL: SHX5813

## 2022-01-09 SURGERY — ESOPHAGOGASTRODUODENOSCOPY (EGD) WITH PROPOFOL
Anesthesia: General

## 2022-01-09 MED ORDER — LIDOCAINE HCL (CARDIAC) PF 100 MG/5ML IV SOSY
PREFILLED_SYRINGE | INTRAVENOUS | Status: DC | PRN
  Administered 2022-01-09: 50 mg via INTRAVENOUS

## 2022-01-09 MED ORDER — SODIUM CHLORIDE 0.9 % IV SOLN: INTRAVENOUS | Status: DC

## 2022-01-09 MED ORDER — MIDAZOLAM HCL 2 MG/2ML IJ SOLN
INTRAMUSCULAR | Status: AC
  Filled 2022-01-09: qty 2

## 2022-01-09 MED ORDER — PROPOFOL 500 MG/50ML IV EMUL
INTRAVENOUS | Status: DC | PRN
  Administered 2022-01-09: 150 ug/kg/min via INTRAVENOUS

## 2022-01-09 NOTE — Interval H&P Note (Signed)
Lucilla Lame, MD [address] [place], [place] [address] Phone:[phone] Fax : [phone]  Primary Care Physician:  [name], MD Primary Gastroenterologist:  Dr. Allen Norris  Pre-Procedure History & Physical: HPI:  Dustin Cobb is a 76 y.o. male is here for an endoscopy.   Past Medical History:  Diagnosis Date   Aching headache    Allergy    ED (erectile dysfunction)    GERD (gastroesophageal reflux disease)    Hyperlipidemia    Hypertension    Insomnia    Metabolic syndrome    Onychomycosis    OSA on CPAP    Postural dizziness    Rash    Sleep apnea    Wears partial dentures     Past Surgical History:  Procedure Laterality Date   CARDIAC CATHETERIZATION     COLONOSCOPY     COLONOSCOPY WITH PROPOFOL N/A 12/03/2020   Procedure: COLONOSCOPY WITH PROPOFOL;  Surgeon: Lucilla Lame, MD;  Location: [facility];  Service: Endoscopy;  Laterality: N/A;   HEMORRHOID BANDING  04/01/2012   Dr. Fleet Contras    Prior to Admission medications   Medication Sig Start Date End Date Taking? Authorizing Provider  augmented betamethasone dipropionate (DIPROLENE-AF) 0.05 % ointment 1(one) application(s) topical 2(two) times a day 05/26/21  Yes   azelastine (ASTELIN) 0.1 % nasal spray Place 1 spray into both nostrils 2 (two) times daily. Use in each nostril as directed 08/23/21  Yes Sowles, Drue Stager, MD  Cholecalciferol (VITAMIN D3) 1000 UNITS CAPS Take 1 capsule by mouth daily.   Yes [provider]  ferrous sulfate 325 (65 FE) MG tablet Take 1 tablet by mouth daily.   Yes [provider]  fexofenadine (ALLEGRA) 180 MG tablet Take 180 mg by mouth daily.   Yes [provider]  fluticasone (FLONASE) 50 MCG/ACT nasal spray Place 2 sprays into both nostrils daily. 07/22/15  Yes Sowles, Drue Stager, MD  pantoprazole (PROTONIX) 40 MG tablet Take 1 tablet (40 mg total) by mouth daily. 12/27/21  Yes Nocholas Damaso, MD  rosuvastatin (CRESTOR) 20 MG tablet  TAKE 1 TABLET BY MOUTH DAILY. 08/23/21 08/23/22 Yes Sowles, Drue Stager, MD  temazepam (RESTORIL) 15 MG capsule Take 1 capsule (15 mg total) by mouth at bedtime as needed for sleep. 08/23/21  Yes [name], MD    Allergies as of 12/27/2021   (No Known Allergies)    Family History  Problem Relation Age of Onset   Cancer Mother        Thyroid   Emphysema Father    COPD Father    Hypertension Sister    Hypertension Brother    Heart disease Brother    Healthy Daughter     Social History   Socioeconomic History   Marital status: Married    Spouse name: Papillion   Number of children: 2   Years of education: Not on file   Highest education level: Associate degree: occupational, Hotel manager, or vocational program  Occupational History   Occupation: retired  Tobacco Use   Smoking status: Former    Packs/day: 1.00    Years: 20.00    Total pack years: 20.00    Types: Cigarettes    Start date: 01/07/1975    Quit date: 01/07/1995    Years since quitting: 27.0   Smokeless tobacco: Never   Tobacco comments:    smoking cessation materials not required  Vaping Use   Vaping Use: Never used  Substance and Sexual Activity   Alcohol use: No  Drug use: No   Sexual activity: Yes    Partners: Female    Birth control/protection: None  Other Topics Concern   Not on file  Social History Narrative   Not on file   Social Determinants of Health   Financial Resource Strain: Low Risk  (08/23/2021)   Overall Financial Resource Strain (CARDIA)    Difficulty of Paying Living Expenses: Not hard at all  Food Insecurity: No Food Insecurity (08/23/2021)   Hunger Vital Sign    Worried About Running Out of Food in the Last Year: Never true    Ran Out of Food in the Last Year: Never true  Transportation Needs: No Transportation Needs (08/23/2021)   PRAPARE - Hydrologist (Medical): No    Lack of Transportation (Non-Medical): No  Physical Activity: Inactive (08/23/2021)    Exercise Vital Sign    Days of Exercise per Week: 0 days    Minutes of Exercise per Session: 0 min  Stress: No Stress Concern Present (08/23/2021)   [place]    Feeling of Stress : Only a little  Social Connections: Socially Integrated (08/23/2021)   Social Connection and Isolation Panel [NHANES]    Frequency of Communication with Friends and Family: More than three times a week    Frequency of Social Gatherings with Friends and Family: Once a week    Attends Religious Services: More than 4 times per year    Active Member of [employer] or Organizations: Yes    Attends Music therapist: More than 4 times per year    Marital Status: Married  Human resources officer Violence: Not At Risk (08/23/2021)   Humiliation, Afraid, Rape, and Kick questionnaire    Fear of Current or Ex-Partner: No    Emotionally Abused: No    Physically Abused: No    Sexually Abused: No    Review of Systems: See HPI, otherwise negative ROS  Physical Exam: BP (!) 137/100   Pulse 66   Temp (!) 97 F (36.1 C) (Temporal)   Ht '5\' 8"'$  (1.727 m)   Wt 93 kg   SpO2 100%   BMI 31.17 kg/m  General:   Alert,  pleasant and cooperative in NAD Head:  Normocephalic and atraumatic. Neck:  Supple; no masses or thyromegaly. Lungs:  Clear throughout to auscultation.    Heart:  Regular rate and rhythm. Abdomen:  Soft, nontender and nondistended. Normal bowel sounds, without guarding, and without rebound.   Neurologic:  Alert and  oriented x4;  grossly normal neurologically.  Impression/Plan: Dustin Cobb is here for an endoscopy to be performed for GERD  Risks, benefits, limitations, and alternatives regarding  endoscopy have been reviewed with the patient.  Questions have been answered.  All parties agreeable.   Lucilla Lame, MD  01/09/2022, 8:26 AM

## 2022-01-09 NOTE — Transfer of Care (Signed)
Immediate Anesthesia Transfer of Care Note  Patient: Dustin Cobb  Procedure(s) Performed: ESOPHAGOGASTRODUODENOSCOPY (EGD) WITH PROPOFOL  Patient Location: PACU  Anesthesia Type:General  Level of Consciousness: sedated  Airway & Oxygen Therapy: Patient Spontanous Breathing and Patient connected to nasal cannula oxygen  Post-op Assessment: Report given to RN and Post -op Vital signs reviewed and stable  Post vital signs: Reviewed and stable  Last Vitals:  Vitals Value Taken Time  BP 101/64 01/09/22 0921  Temp    Pulse 67 01/09/22 0921  Resp 15 01/09/22 0921  SpO2 98 % 01/09/22 0921  Vitals shown include unvalidated device data.  Last Pain:  Vitals:   01/09/22 0811  TempSrc: Temporal  PainSc: 0-No pain      Patients Stated Pain Goal: 0 (82/41/75 3010)  Complications: No notable events documented.

## 2022-01-09 NOTE — Anesthesia Preprocedure Evaluation (Signed)
Anesthesia Evaluation  Patient identified by MRN, date of birth, ID band Patient awake    Reviewed: Allergy & Precautions, NPO status , Patient's Chart, lab work & pertinent test results  Airway Mallampati: III  TM Distance: >3 FB Neck ROM: Full    Dental  (+) Teeth Intact, Missing,    Pulmonary sleep apnea and Continuous Positive Airway Pressure Ventilation , former smoker,    breath sounds clear to auscultation       Cardiovascular Exercise Tolerance: Good hypertension,  Rhythm:Regular     Neuro/Psych  Headaches, negative psych ROS   GI/Hepatic Neg liver ROS, GERD  Medicated,  Endo/Other  negative endocrine ROS  Renal/GU negative Renal ROS     Musculoskeletal   Abdominal Normal abdominal exam  (+)   Peds negative pediatric ROS (+)  Hematology negative hematology ROS (+)   Anesthesia Other Findings   Reproductive/Obstetrics                             Anesthesia Physical Anesthesia Plan  ASA: 2  Anesthesia Plan: General   Post-op Pain Management:    Induction: Intravenous  PONV Risk Score and Plan:   Airway Management Planned: Natural Airway  Additional Equipment:   Intra-op Plan:   Post-operative Plan:   Informed Consent: I have reviewed the patients History and Physical, chart, labs and discussed the procedure including the risks, benefits and alternatives for the proposed anesthesia with the patient or authorized representative who has indicated his/her understanding and acceptance.       Plan Discussed with: CRNA and Surgeon  Anesthesia Plan Comments:         Anesthesia Quick Evaluation

## 2022-01-09 NOTE — Anesthesia Postprocedure Evaluation (Signed)
Anesthesia Post Note  Patient: Dustin Cobb  Procedure(s) Performed: ESOPHAGOGASTRODUODENOSCOPY (EGD) WITH PROPOFOL  Patient location during evaluation: PACU Anesthesia Type: General Level of consciousness: awake and awake and alert Pain management: pain level controlled Vital Signs Assessment: post-procedure vital signs reviewed and stable Respiratory status: spontaneous breathing and nonlabored ventilation Cardiovascular status: stable Anesthetic complications: no   No notable events documented.   Last Vitals:  Vitals:   01/09/22 0930 01/09/22 0941  BP: 100/69 120/80  Pulse: 63 61  Resp: 14 (!) 21  Temp:  (!) 36.1 C  SpO2: 96% 100%    Last Pain:  Vitals:   01/09/22 0941  TempSrc:   PainSc: 0-No pain                 VAN STAVEREN,Scotty Weigelt

## 2022-01-10 ENCOUNTER — Other Ambulatory Visit: Payer: Self-pay

## 2022-01-10 ENCOUNTER — Encounter: Payer: Self-pay | Admitting: Gastroenterology

## 2022-01-10 LAB — SURGICAL PATHOLOGY

## 2022-01-12 NOTE — Op Note (Signed)
[facility] Gastroenterology Patient Name: Dustin Cobb Procedure Date: 01/09/2022 9:03 AM MRN: [id number] Account #: [id number] Date of Birth: 1945-05-31 Admit Type: Outpatient Age: 76 Room: [facility] ENDO ROOM 4 Gender: Male Note Status: Finalized Instrument Name: Upper Endoscope [id number] Procedure:             Upper GI endoscopy Indications:           Heartburn Providers:             Lucilla Lame MD, MD Referring MD:          Bethena Roys. Sowles, MD (Referring MD) Medicines:             Propofol per Anesthesia Complications:         No immediate complications. Procedure:             Pre-Anesthesia Assessment:                        - Prior to the procedure, a History and Physical was                         performed, and patient medications and allergies were                         reviewed. The patient's tolerance of previous                         anesthesia was also reviewed. The risks and benefits                         of the procedure and the sedation options and risks                         were discussed with the patient. All questions were                         answered, and informed consent was obtained. Prior                         Anticoagulants: The patient has taken no previous                         anticoagulant or antiplatelet agents. ASA Grade                         Assessment: II - A patient with mild systemic disease.                         After reviewing the risks and benefits, the patient                         was deemed in satisfactory condition to undergo the                         procedure.                        After obtaining informed consent, the endoscope was  passed under direct vision. Throughout the procedure,                         the patient's blood pressure, pulse, and oxygen                         saturations were monitored continuously. The Endoscope                         was  introduced through the mouth, and advanced to the                         second part of duodenum. The upper GI endoscopy was                         accomplished without difficulty. The patient tolerated                         the procedure well. Findings:      A small hiatal hernia was present.      Diffuse moderately erythematous mucosa without bleeding was found in the       gastric antrum. Biopsies were taken with a cold forceps for histology.      The examined duodenum was normal. Impression:            - Small hiatal hernia.                        - Erythematous mucosa in the antrum. Biopsied.                        - Normal examined duodenum. Recommendation:        - Discharge patient to home.                        - Resume previous diet.                        - Continue present medications.                        - Await pathology results. Procedure Code(s):     --- Professional ---                        [phone], Esophagogastroduodenoscopy, flexible,                         transoral; with biopsy, single or multiple Diagnosis Code(s):     --- Professional ---                        R12, Heartburn                        K31.89, Other diseases of stomach and duodenum CPT copyright 2019 American Medical Association. All rights reserved. The codes documented in this report are preliminary and upon coder review may  be revised to meet current compliance requirements. Lucilla Lame MD, MD 01/09/2022 9:18:15 AM This report has been signed electronically. Number of Addenda: 0 Note Initiated On: 01/09/2022 9:03 AM Estimated Blood Loss:  Estimated  blood loss: none.      [facility]

## 2022-01-23 ENCOUNTER — Other Ambulatory Visit: Payer: Self-pay

## 2022-01-24 ENCOUNTER — Encounter: Payer: Self-pay | Admitting: Gastroenterology

## 2022-01-24 ENCOUNTER — Other Ambulatory Visit: Payer: Self-pay

## 2022-02-15 ENCOUNTER — Other Ambulatory Visit: Payer: Self-pay

## 2022-02-15 DIAGNOSIS — G4733 Obstructive sleep apnea (adult) (pediatric): Secondary | ICD-10-CM | POA: Diagnosis not present

## 2022-02-15 DIAGNOSIS — L280 Lichen simplex chronicus: Secondary | ICD-10-CM | POA: Diagnosis not present

## 2022-02-15 MED ORDER — BETAMETHASONE DIPROPIONATE AUG 0.05 % EX CREA
TOPICAL_CREAM | CUTANEOUS | 1 refills | Status: AC
  Filled 2022-02-15: qty 50, 30d supply, fill #0
  Filled 2022-09-17: qty 50, 30d supply, fill #1

## 2022-02-16 ENCOUNTER — Other Ambulatory Visit: Payer: Self-pay

## 2022-02-22 ENCOUNTER — Other Ambulatory Visit: Payer: Self-pay

## 2022-02-22 ENCOUNTER — Other Ambulatory Visit: Payer: Self-pay | Admitting: Family Medicine

## 2022-02-22 DIAGNOSIS — F5101 Primary insomnia: Secondary | ICD-10-CM

## 2022-02-22 MED FILL — Temazepam Cap 15 MG: ORAL | 90 days supply | Qty: 90 | Fill #0 | Status: AC

## 2022-02-23 NOTE — Progress Notes (Signed)
Name: Dustin Cobb   MRN: [id number]    DOB: Oct 07, 1945   Date:02/24/2022       Progress Note  Subjective  Chief Complaint  Follow Up  HPI  Hyperlipidemia: taking Rosuvastatin now, no side effects, no chest pain or myalgias, reviewed labs done in 09/22 and LDL was down to 65 , we will recheck levels today   HTN: he used take medication but stopped a while back, bp elevated today and we will try resuming losartan 50 mg and can cut in half if gets light headed. He will return for bp check on in a couple of weeks. I also asked him to get a bp monitor and check it at home   Abdominal aorta aneurysm: found on screen [place] in 2020 , size was 3.1 cm and is due for recheck in 2023, he is taking statin therapy , he is not on aspirin because he has bleeding hemorrhoids, history of anemia and GERD . He has an appointment with Dr. Lucky Cowboy on October 10 th  Hyperglycemia:  hgbA1C has been stable  last level was 5.7 %  No polyphagia, polydipsia or polyuria He continues to eat a balanced, he eats desserts most days of the week again, discussed mindfulness    Low TSH: seen by Dr. Gabriel Carina in the past , TSH was a little suppressed in May 22 but normal Free T4. He denies palpitation or dysphagia. We will recheck level today    GERD: he had a repeat Endoscopy and showed chronic gastritis with some active disease and he is now taking Pantoprazole daily symptoms seems to be better now  OSA: he has been wearing CPAP machine almost every night. . He wakes up feeling rested most days. He states no leakage , pressure seems okay for him , he states sometimes has restless sleep, melatonin did not help and he stopped He wakes up feeling tired, we sent rx for Belsomra but not covered by insurance so we changed to Temazepam, he is sleeping better no longer waking up during the night, but still feels like does not have a sound sleep    Obesity: discussed trying to go down to below 200 lbs. He is more aware of portion sizes, he  is eating more vegetables.   AR: on nasal steroid and claritin, still has intermittent symptoms. Worse during Fall and Spring . He is noticing more post-nasal drainage.   Sellar Mass: MRI ordered by ENT for evaluation of hearing loss, incidental finding of pituitary adenoma, seen by Dr. Lacinda Axon and per patient he said to repeat in 2 years since stable findings.  He denies double vision, balance problems or weakness   Impression/Plan:  Mr. Elk is here for evaluation of an abnormality found on MRI of the brain. We did discuss that there is some small area of hypoenhancing focus which could be a small nodule. Given this, I would recommend laboratory evaluation of the pituitary gland. However, no other treatment is needed at this time if those labs are normal. I do not suspect any vision problems from this given the small nature. We did discuss the natural history of nodules in this area and it is most likely benign. I cannot prove this is neoplastic at this time and therefore I do recommend a repeat MRI in 1 year. He agrees to this plan.  Repeat in April 2023   IMPRESSION: 1. Equivocal 3 mm hypoenhancing focus only seen on coronal postcontrast images within the pituitary gland at midline,  may represent a pituitary macro adenoma, Rathke's cleft cyst versus artifact. 2. Mild chronic microvascular ischemic changes of the white matter.   Patient Active Problem List   Diagnosis Date Noted   Gastritis without bleeding    Aortic aneurysm without rupture ([place]) 08/23/2021   Pituitary microadenoma ([place]) 08/23/2021   Abdominal aortic aneurysm (AAA) without rupture ([place]) 08/23/2021   OSA treated with BiPAP 07/11/2021   Encounter for BiPAP use counseling 07/11/2021   Encounter for screening colonoscopy    Lichen simplex chronicus 02/13/2018   Rash of hands 08/10/2016   Hypertension, essential, benign 01/07/2015   GERD (gastroesophageal reflux disease) 01/07/2015   OSA on CPAP 01/07/2015   Low TSH  level 01/07/2015   Hyperglycemia 01/07/2015   Obesity (BMI 30.0-34.9) 01/07/2015   Dyslipidemia 01/07/2015   History of hemorrhoidectomy 01/07/2015   History of iron deficiency anemia 01/07/2015   Allergic rhinitis 09/32/6712   Metabolic syndrome 45/80/9983    Past Surgical History:  Procedure Laterality Date   CARDIAC CATHETERIZATION     COLONOSCOPY     COLONOSCOPY WITH PROPOFOL N/A 12/03/2020   Procedure: COLONOSCOPY WITH PROPOFOL;  Surgeon: Lucilla Lame, MD;  Location: [facility];  Service: Endoscopy;  Laterality: N/A;   ESOPHAGOGASTRODUODENOSCOPY (EGD) WITH PROPOFOL N/A 01/09/2022   Procedure: ESOPHAGOGASTRODUODENOSCOPY (EGD) WITH PROPOFOL;  Surgeon: Lucilla Lame, MD;  Location: [facility] ENDOSCOPY;  Service: Endoscopy;  Laterality: N/A;   HEMORRHOID BANDING  04/01/2012   Dr. Fleet Contras    Family History  Problem Relation Age of Onset   Cancer Mother        Thyroid   Emphysema Father    COPD Father    Hypertension Sister    Hypertension Brother    Heart disease Brother    Healthy Daughter     Social History   Tobacco Use   Smoking status: Former    Packs/day: 1.00    Years: 20.00    Total pack years: 20.00    Types: Cigarettes    Start date: 01/07/1975    Quit date: 01/07/1995    Years since quitting: 27.1   Smokeless tobacco: Never   Tobacco comments:    smoking cessation materials not required  Substance Use Topics   Alcohol use: No     Current Outpatient Medications:    losartan (COZAAR) 50 MG tablet, Take 1 tablet (50 mg total) by mouth daily., Disp: 90 tablet, Rfl: 1   augmented betamethasone dipropionate (DIPROLENE-AF) 0.05 % cream, 1(one) application(s) topical 2(two) times a day (Patient not taking: Reported on 02/24/2022), Disp: 50 g, Rfl: 1   azelastine (ASTELIN) 0.1 % nasal spray, Place 1 spray into both nostrils 2 (two) times daily. Use in each nostril as directed, Disp: 30 mL, Rfl: 12   Cholecalciferol (VITAMIN D3) 1000 UNITS CAPS, Take 1 capsule  by mouth daily., Disp: , Rfl:    ferrous sulfate 325 (65 FE) MG tablet, Take 1 tablet by mouth daily., Disp: , Rfl:    fexofenadine (ALLEGRA) 180 MG tablet, Take 180 mg by mouth daily., Disp: , Rfl:    fluticasone (FLONASE) 50 MCG/ACT nasal spray, Place 2 sprays into both nostrils daily., Disp: 16 g, Rfl: 5   pantoprazole (PROTONIX) 40 MG tablet, Take 1 tablet (40 mg total) by mouth daily., Disp: 30 tablet, Rfl: 5   rosuvastatin (CRESTOR) 20 MG tablet, TAKE 1 TABLET BY MOUTH DAILY., Disp: 90 tablet, Rfl: 1   temazepam (RESTORIL) 15 MG capsule, Take 1 capsule (15 mg total) by mouth at  bedtime as needed for sleep., Disp: 90 capsule, Rfl: 1  No Known Allergies  I personally reviewed active problem list, medication list, allergies, family history, social history, health maintenance with the patient/caregiver today.   ROS  Constitutional: Negative for fever or weight change.  Respiratory: Negative for cough and shortness of breath.   Cardiovascular: Negative for chest pain or palpitations.  Gastrointestinal: Negative for abdominal pain, no bowel changes.  Musculoskeletal: Negative for gait problem or joint swelling.  Skin: Negative for rash.  Neurological: Negative for dizziness or headache.  No other specific complaints in a complete review of systems (except as listed in HPI above).   Objective  Vitals:   02/24/22 0944  BP: (!) 142/86  Pulse: 66  Resp: 16  Temp: 98 F (36.7 C)  TempSrc: Oral  SpO2: 93%  Weight: 204 lb (92.5 kg)  Height: '5\' 8"'$  (1.727 m)    Body mass index is 31.02 kg/m.  Physical Exam  Constitutional: Patient appears well-developed and well-nourished. Obese  No distress.  HEENT: head atraumatic, normocephalic, pupils equal and reactive to light,, neck supple, throat within normal limits Cardiovascular: Normal rate, regular rhythm and normal heart sounds.  No murmur heard. No BLE edema. Pulmonary/Chest: Effort normal and breath sounds normal. No respiratory  distress. Abdominal: Soft.  There is no tenderness. Psychiatric: Patient has a normal mood and affect. behavior is normal. Judgment and thought content normal.   Recent Results (from the past 2160 hour(s))  Surgical pathology     Status: None   Collection Time: 01/09/22  9:15 AM  Result Value Ref Range   SURGICAL PATHOLOGY      SURGICAL PATHOLOGY CASE: [id number] PATIENT: Ophelia Charter Surgical Pathology Report     Specimen Submitted: A. Stomach, antrum; cbx  Clinical History: GERD K21.9.  Antral gastritis    DIAGNOSIS: A. STOMACH, ANTRUM; COLD BIOPSY: - GASTRIC ANTRAL MUCOSA DISPLAYING CHRONIC GASTRITIS WITH FOCAL ACTIVITY. - NEGATIVE FOR H. PYLORI, DYSPLASIA, AND MALIGNANCY.  Comment: Due to the presence of focal active mucosal inflammation, in a background of chronic gastritis, an immunohistochemical study directed against H. pylori was performed. This study is negative.  IHC slides were prepared by Launa Grill, [place]. All controls stained appropriately.  This test was developed and its performance characteristics determined by [facility]. It has not been cleared or approved by the [place] Food and Drug Administration. The FDA does not require this test to go through premarket FDA review. This test is used for clinical purposes. It should not be regarded as investigational  or for research. This laboratory is certified under the Clinical Laboratory Improvement Amendments (CLIA) as qualified to perform high complexity clinical laboratory testing.  GROSS DESCRIPTION: A. Labeled: Cbx gastric antrum Received: Formalin Collection time: 9:15 AM on 01/09/2022 Placed into formalin time: 9:15 AM on 01/09/2022 Tissue fragment(s): 1 Size: 0.5 x 0.3 x 0.1 cm Description: Pink soft tissue fragment Entirely submitted in 1 cassette.  CM 01/09/2022  Final Diagnosis performed by Allena Napoleon, MD.   Electronically signed 01/10/2022 4:33:15PM The electronic signature indicates  that the named Attending Pathologist has evaluated the specimen Technical component performed at [facility], [address] [place], [place] [address] Lab: [phone] Dir: Rush Farmer, MD, MMM  Professional component performed at [facility], [facility], [place], [place], [place] [address] Lab: [phone] Dir: Kathi Simpers, MD     PHQ2/9:    02/24/2022    9:46 AM 08/23/2021    1:46 PM 08/23/2021    1:38  PM 02/23/2021    8:34 AM 08/23/2020    7:53 AM  Depression screen PHQ 2/9  Decreased Interest 0 0 0 0 0  Down, Depressed, Hopeless 0 0 0 0 0  PHQ - 2 Score 0 0 0 0 0  Altered sleeping 0 0 0  0  Tired, decreased energy 0 0 0  0  Change in appetite 0 0 0  0  Feeling bad or failure about yourself  0 0 0  0  Trouble concentrating 0 0 0  0  Moving slowly or fidgety/restless 0 0 0  0  Suicidal thoughts 0 0 0  0  PHQ-9 Score 0 0 0  0    phq 9 is negative   Fall Risk:    02/24/2022    9:46 AM 08/23/2021    1:48 PM 08/23/2021    1:38 PM 02/23/2021    8:34 AM 08/23/2020    7:53 AM  Fall Risk   Falls in the past year? 0 0 0 0 0  Number falls in past yr:  0 0 0 0  Injury with Fall?  0 0 0 0  Risk for fall due to : No Fall Risks No Fall Risks No Fall Risks No Fall Risks   Follow up Falls prevention discussed;Education provided;Falls evaluation completed Falls prevention discussed Falls prevention discussed Falls prevention discussed       Functional Status Survey: Is the patient deaf or have difficulty hearing?: Yes Does the patient have difficulty seeing, even when wearing glasses/contacts?: No Does the patient have difficulty concentrating, remembering, or making decisions?: No Does the patient have difficulty walking or climbing stairs?: No Does the patient have difficulty dressing or bathing?: No Does the patient have difficulty doing errands alone such as visiting a doctor's office or shopping?: No    Assessment & Plan  1. Pituitary  microadenoma [facility])  Seen by Dr. Lacinda Axon and is going back in 2 years   2. Aortic aneurysm without rupture, unspecified portion of aorta (HCC)  - rosuvastatin (CRESTOR) 20 MG tablet; TAKE 1 TABLET BY MOUTH DAILY.  Dispense: 90 tablet; Refill: 1  3. Subclinical hypothyroidism  - TSH  4. Hypertension, essential, benign  - Lipid panel - CBC with Differential/Platelet - COMPLETE METABOLIC PANEL WITH GFR - losartan (COZAAR) 50 MG tablet; Take 1 tablet (50 mg total) by mouth daily.  Dispense: 90 tablet; Refill: 1  5. OSA on CPAP  Continue CPAP  6. Primary insomnia   7. Gastroesophageal reflux disease without esophagitis   8. Need for immunization against influenza  - Flu Vaccine QUAD High Dose(Fluad)  9. Dyslipidemia  - rosuvastatin (CRESTOR) 20 MG tablet; TAKE 1 TABLET BY MOUTH DAILY.  Dispense: 90 tablet; Refill: 1  10. Perennial allergic rhinitis with seasonal variation   11. History of iron deficiency anemia   12. Hyperglycemia  - Hemoglobin A1c  13. Vitamin D deficiency   14. OSA treated with BiPAP

## 2022-02-24 ENCOUNTER — Other Ambulatory Visit: Payer: Self-pay

## 2022-02-24 ENCOUNTER — Encounter: Payer: Self-pay | Admitting: Family Medicine

## 2022-02-24 ENCOUNTER — Ambulatory Visit (INDEPENDENT_AMBULATORY_CARE_PROVIDER_SITE_OTHER): Payer: PPO | Admitting: Family Medicine

## 2022-02-24 VITALS — BP 146/84 | HR 66 | Temp 98.0°F | Resp 16 | Ht 68.0 in | Wt 204.0 lb

## 2022-02-24 DIAGNOSIS — F5101 Primary insomnia: Secondary | ICD-10-CM | POA: Diagnosis not present

## 2022-02-24 DIAGNOSIS — I719 Aortic aneurysm of unspecified site, without rupture: Secondary | ICD-10-CM | POA: Diagnosis not present

## 2022-02-24 DIAGNOSIS — E559 Vitamin D deficiency, unspecified: Secondary | ICD-10-CM

## 2022-02-24 DIAGNOSIS — K219 Gastro-esophageal reflux disease without esophagitis: Secondary | ICD-10-CM

## 2022-02-24 DIAGNOSIS — R739 Hyperglycemia, unspecified: Secondary | ICD-10-CM

## 2022-02-24 DIAGNOSIS — Z862 Personal history of diseases of the blood and blood-forming organs and certain disorders involving the immune mechanism: Secondary | ICD-10-CM | POA: Diagnosis not present

## 2022-02-24 DIAGNOSIS — I10 Essential (primary) hypertension: Secondary | ICD-10-CM

## 2022-02-24 DIAGNOSIS — Z23 Encounter for immunization: Secondary | ICD-10-CM | POA: Diagnosis not present

## 2022-02-24 DIAGNOSIS — J302 Other seasonal allergic rhinitis: Secondary | ICD-10-CM

## 2022-02-24 DIAGNOSIS — D352 Benign neoplasm of pituitary gland: Secondary | ICD-10-CM

## 2022-02-24 DIAGNOSIS — E038 Other specified hypothyroidism: Secondary | ICD-10-CM | POA: Diagnosis not present

## 2022-02-24 DIAGNOSIS — J3089 Other allergic rhinitis: Secondary | ICD-10-CM

## 2022-02-24 DIAGNOSIS — G4733 Obstructive sleep apnea (adult) (pediatric): Secondary | ICD-10-CM

## 2022-02-24 DIAGNOSIS — E785 Hyperlipidemia, unspecified: Secondary | ICD-10-CM | POA: Diagnosis not present

## 2022-02-24 DIAGNOSIS — Z9989 Dependence on other enabling machines and devices: Secondary | ICD-10-CM

## 2022-02-24 MED ORDER — ROSUVASTATIN CALCIUM 20 MG PO TABS
ORAL_TABLET | Freq: Every day | ORAL | 1 refills | Status: DC
  Filled 2022-02-24: qty 90, fill #0
  Filled 2022-04-08: qty 90, 90d supply, fill #0
  Filled 2022-06-22: qty 90, 90d supply, fill #1

## 2022-02-24 MED ORDER — LOSARTAN POTASSIUM 50 MG PO TABS
50.0000 mg | ORAL_TABLET | Freq: Every day | ORAL | 1 refills | Status: DC
  Filled 2022-02-24: qty 90, 90d supply, fill #0
  Filled 2022-06-22: qty 90, 90d supply, fill #1

## 2022-02-25 LAB — CBC WITH DIFFERENTIAL/PLATELET
Absolute Monocytes: 628 cells/uL (ref 200–950)
Basophils Absolute: 43 cells/uL (ref 0–200)
Basophils Relative: 0.7 %
Eosinophils Absolute: 281 cells/uL (ref 15–500)
Eosinophils Relative: 4.6 %
HCT: 43.6 % (ref 38.5–50.0)
Hemoglobin: 14.4 g/dL (ref 13.2–17.1)
Lymphs Abs: 2123 cells/uL (ref 850–3900)
MCH: 28.6 pg (ref 27.0–33.0)
MCHC: 33 g/dL (ref 32.0–36.0)
MCV: 86.5 fL (ref 80.0–100.0)
MPV: 9.3 fL (ref 7.5–12.5)
Monocytes Relative: 10.3 %
Neutro Abs: 3026 cells/uL (ref 1500–7800)
Neutrophils Relative %: 49.6 %
Platelets: 194 10*3/uL (ref 140–400)
RBC: 5.04 10*6/uL (ref 4.20–5.80)
RDW: 13.8 % (ref 11.0–15.0)
Total Lymphocyte: 34.8 %
WBC: 6.1 10*3/uL (ref 3.8–10.8)

## 2022-02-25 LAB — COMPLETE METABOLIC PANEL WITH GFR
AG Ratio: 1.4 (calc) (ref 1.0–2.5)
ALT: 19 U/L (ref 9–46)
AST: 26 U/L (ref 10–35)
Albumin: 4.2 g/dL (ref 3.6–5.1)
Alkaline phosphatase (APISO): 95 U/L (ref 35–144)
BUN: 16 mg/dL (ref 7–25)
CO2: 28 mmol/L (ref 20–32)
Calcium: 9.4 mg/dL (ref 8.6–10.3)
Chloride: 104 mmol/L (ref 98–110)
Creat: 0.94 mg/dL (ref 0.70–1.28)
Globulin: 2.9 g/dL (calc) (ref 1.9–3.7)
Glucose, Bld: 85 mg/dL (ref 65–99)
Potassium: 4.7 mmol/L (ref 3.5–5.3)
Sodium: 138 mmol/L (ref 135–146)
Total Bilirubin: 0.7 mg/dL (ref 0.2–1.2)
Total Protein: 7.1 g/dL (ref 6.1–8.1)
eGFR: 85 mL/min/{1.73_m2} (ref >=60)

## 2022-02-25 LAB — LIPID PANEL
Cholesterol: 146 mg/dL (ref <200)
HDL: 62 mg/dL (ref >=40)
LDL Cholesterol (Calc): 71 mg/dL (calc)
Non-HDL Cholesterol (Calc): 84 mg/dL (calc) (ref <130)
Total CHOL/HDL Ratio: 2.4 (calc) (ref <5.0)
Triglycerides: 47 mg/dL (ref <150)

## 2022-02-25 LAB — HEMOGLOBIN A1C
Hgb A1c MFr Bld: 5.9 % of total Hgb — ABNORMAL HIGH (ref <5.7)
Mean Plasma Glucose: 123 mg/dL
eAG (mmol/L): 6.8 mmol/L

## 2022-02-25 LAB — TSH: TSH: 0.39 mIU/L — ABNORMAL LOW (ref 0.40–4.50)

## 2022-03-07 ENCOUNTER — Ambulatory Visit (INDEPENDENT_AMBULATORY_CARE_PROVIDER_SITE_OTHER): Payer: PPO

## 2022-03-07 ENCOUNTER — Other Ambulatory Visit (INDEPENDENT_AMBULATORY_CARE_PROVIDER_SITE_OTHER): Payer: Self-pay | Admitting: Vascular Surgery

## 2022-03-07 ENCOUNTER — Ambulatory Visit (INDEPENDENT_AMBULATORY_CARE_PROVIDER_SITE_OTHER): Payer: PPO | Admitting: Vascular Surgery

## 2022-03-07 ENCOUNTER — Encounter (INDEPENDENT_AMBULATORY_CARE_PROVIDER_SITE_OTHER): Payer: Self-pay | Admitting: Vascular Surgery

## 2022-03-07 VITALS — BP 113/77 | HR 70 | Resp 17 | Ht 68.0 in | Wt 204.6 lb

## 2022-03-07 DIAGNOSIS — E785 Hyperlipidemia, unspecified: Secondary | ICD-10-CM

## 2022-03-07 DIAGNOSIS — I10 Essential (primary) hypertension: Secondary | ICD-10-CM | POA: Diagnosis not present

## 2022-03-07 DIAGNOSIS — I714 Abdominal aortic aneurysm, without rupture, unspecified: Secondary | ICD-10-CM

## 2022-03-07 NOTE — Assessment & Plan Note (Signed)
Duplex today shows an approximately 3.2 cm abdominal aortic aneurysm in the proximal infrarenal aorta.  This is minimally changed from his screening ultrasound back in 2020.  He was just over 3 cm at that time.  We discussed the pathophysiology and natural history of abdominal aortic aneurysms with the patient today.  We discussed the reason and rationale for treatment and that in the infrarenal aorta 5 cm is generally our threshold for consideration for repair.  At this point, for an aneurysm greater than 3 cm, an annual follow-up with duplex would be appropriate.  We will see him back in 1 year with follow-up studies and he will contact our office with problems in the interim.

## 2022-03-07 NOTE — Assessment & Plan Note (Signed)
lipid control important in reducing the progression of atherosclerotic disease. Continue statin therapy  

## 2022-03-07 NOTE — Progress Notes (Signed)
Patient ID: Dustin Cobb, male   DOB: [birth year], 76 y.o.   MRN: [id number]  Chief Complaint  Patient presents with   Establish Care    Referred by Dr Ancil Boozer    HPI Dustin Cobb is a 76 y.o. male.  I am asked to see the patient by Dr. Ancil Boozer for evaluation of of his abdominal aortic aneurysm.  The patient had a screening ultrasound about 3-1/2 years ago which showed an approximately 3.0 to 3.1 cm abdominal aortic aneurysm.  He has no aneurysm related symptoms. Specifically, the patient denies new back or abdominal pain, or signs of peripheral embolization.  To his knowledge, it had not been checked since that time. Duplex today shows an approximately 3.2 cm abdominal aortic aneurysm in the proximal infrarenal aorta.  This is minimally changed from his screening ultrasound back in 2020.  He was just over 3 cm at that time.   Past Medical History:  Diagnosis Date   Aching headache    Allergy    ED (erectile dysfunction)    GERD (gastroesophageal reflux disease)    Hyperlipidemia    Hypertension    Insomnia    Metabolic syndrome    Onychomycosis    OSA on CPAP    Postural dizziness    Rash    Sleep apnea    Wears partial dentures     Past Surgical History:  Procedure Laterality Date   CARDIAC CATHETERIZATION     COLONOSCOPY     COLONOSCOPY WITH PROPOFOL N/A 12/03/2020   Procedure: COLONOSCOPY WITH PROPOFOL;  Surgeon: Lucilla Lame, MD;  Location: [facility];  Service: Endoscopy;  Laterality: N/A;   ESOPHAGOGASTRODUODENOSCOPY (EGD) WITH PROPOFOL N/A 01/09/2022   Procedure: ESOPHAGOGASTRODUODENOSCOPY (EGD) WITH PROPOFOL;  Surgeon: Lucilla Lame, MD;  Location: [facility] ENDOSCOPY;  Service: Endoscopy;  Laterality: N/A;   HEMORRHOID BANDING  04/01/2012   Dr. Fleet Contras     Family History  Problem Relation Age of Onset   Cancer Mother        Thyroid   Emphysema Father    COPD Father    Hypertension Sister    Hypertension Brother    Heart disease Brother     Healthy Daughter       Social History   Tobacco Use   Smoking status: Former    Packs/day: 1.00    Years: 20.00    Total pack years: 20.00    Types: Cigarettes    Start date: 01/07/1975    Quit date: 01/07/1995    Years since quitting: 27.1   Smokeless tobacco: Never   Tobacco comments:    smoking cessation materials not required  Vaping Use   Vaping Use: Never used  Substance Use Topics   Alcohol use: No   Drug use: No     No Known Allergies  Current Outpatient Medications  Medication Sig Dispense Refill   augmented betamethasone dipropionate (DIPROLENE-AF) 0.05 % cream 1(one) application(s) topical 2(two) times a day 50 g 1   azelastine (ASTELIN) 0.1 % nasal spray Place 1 spray into both nostrils 2 (two) times daily. Use in each nostril as directed 30 mL 12   Cholecalciferol (VITAMIN D3) 1000 UNITS CAPS Take 1 capsule by mouth daily.     ferrous sulfate 325 (65 FE) MG tablet Take 1 tablet by mouth daily.     fexofenadine (ALLEGRA) 180 MG tablet Take 180 mg by mouth daily.     losartan (COZAAR) 50 MG tablet Take 1 tablet (50  mg total) by mouth daily. 90 tablet 1   pantoprazole (PROTONIX) 40 MG tablet Take 1 tablet (40 mg total) by mouth daily. 30 tablet 5   rosuvastatin (CRESTOR) 20 MG tablet TAKE 1 TABLET BY MOUTH DAILY. 90 tablet 1   temazepam (RESTORIL) 15 MG capsule Take 1 capsule (15 mg total) by mouth at bedtime as needed for sleep. 90 capsule 1   fluticasone (FLONASE) 50 MCG/ACT nasal spray Place 2 sprays into both nostrils daily. (Patient not taking: Reported on 03/07/2022) 16 g 5   No current facility-administered medications for this visit.      REVIEW OF SYSTEMS (Negative unless checked)  Constitutional: [] Weight loss  [] Fever  [] Chills Cardiac: [] Chest pain   [] Chest pressure   [] Palpitations   [] Shortness of breath when laying flat   [] Shortness of breath at rest   [] Shortness of breath with exertion. Vascular:  [] Pain in legs with walking   [] Pain in  legs at rest   [] Pain in legs when laying flat   [] Claudication   [] Pain in feet when walking  [] Pain in feet at rest  [] Pain in feet when laying flat   [] History of DVT   [] Phlebitis   [] Swelling in legs   [] Varicose veins   [] Non-healing ulcers Pulmonary:   [] Uses home oxygen   [] Productive cough   [] Hemoptysis   [] Wheeze  [] COPD   [] Asthma Neurologic:  [] Dizziness  [] Blackouts   [] Seizures   [] History of stroke   [] History of TIA  [] Aphasia   [] Temporary blindness   [] Dysphagia   [] Weakness or numbness in arms   [] Weakness or numbness in legs Musculoskeletal:  [x] Arthritis   [] Joint swelling   [] Joint pain   [] Low back pain Hematologic:  [] Easy bruising  [] Easy bleeding   [] Hypercoagulable state   [] Anemic  [] Hepatitis Gastrointestinal:  [] Blood in stool   [] Vomiting blood  [x] Gastroesophageal reflux/heartburn   [] Abdominal pain Genitourinary:  [] Chronic kidney disease   [] Difficult urination  [] Frequent urination  [] Burning with urination   [] Hematuria Skin:  [] Rashes   [] Ulcers   [] Wounds Psychological:  [] History of anxiety   []  History of major depression.    Physical Exam BP 113/77 (BP Location: Right Arm)   Pulse 70   Resp 17   Ht 5' 8"  (1.727 m)   Wt 204 lb 9.6 oz (92.8 kg)   BMI 31.11 kg/m  Gen:  WD/WN, NAD. Appears younger than stated age. Head: [place]/AT, No temporalis wasting. Ear/Nose/Throat: Hearing grossly intact, nares w/o erythema or drainage, oropharynx w/o Erythema/Exudate Eyes: Conjunctiva clear, sclera non-icteric  Neck: trachea midline.  No JVD.  Pulmonary:  Good air movement, respirations not labored, no use of accessory muscles  Cardiac: RRR, no JVD Vascular:  Vessel Right Left  Radial Palpable Palpable                                   Gastrointestinal:. No masses, surgical incisions, or scars. No increased aortic impulse Musculoskeletal: M/S 5/5 throughout.  Extremities without ischemic changes.  No deformity or atrophy. No edema. Neurologic: Sensation  grossly intact in extremities.  Symmetrical.  Speech is fluent. Motor exam as listed above. Psychiatric: Judgment intact, Mood & affect appropriate for pt's clinical situation. Dermatologic: No rashes or ulcers noted.  No cellulitis or open wounds.    Radiology No results found.  Labs Recent Results (from the past 2160 hour(s))  Surgical pathology  Status: None   Collection Time: 01/09/22  9:15 AM  Result Value Ref Range   SURGICAL PATHOLOGY      SURGICAL PATHOLOGY CASE: [id number] PATIENT: Ophelia Charter Surgical Pathology Report     Specimen Submitted: A. Stomach, antrum; cbx  Clinical History: GERD K21.9.  Antral gastritis    DIAGNOSIS: A. STOMACH, ANTRUM; COLD BIOPSY: - GASTRIC ANTRAL MUCOSA DISPLAYING CHRONIC GASTRITIS WITH FOCAL ACTIVITY. - NEGATIVE FOR H. PYLORI, DYSPLASIA, AND MALIGNANCY.  Comment: Due to the presence of focal active mucosal inflammation, in a background of chronic gastritis, an immunohistochemical study directed against H. pylori was performed. This study is negative.  IHC slides were prepared by Launa Grill, [place]. All controls stained appropriately.  This test was developed and its performance characteristics determined by [facility]. It has not been cleared or approved by the [place] Food and Drug Administration. The FDA does not require this test to go through premarket FDA review. This test is used for clinical purposes. It should not be regarded as investigational  or for research. This laboratory is certified under the Clinical Laboratory Improvement Amendments (CLIA) as qualified to perform high complexity clinical laboratory testing.  GROSS DESCRIPTION: A. Labeled: Cbx gastric antrum Received: Formalin Collection time: 9:15 AM on 01/09/2022 Placed into formalin time: 9:15 AM on 01/09/2022 Tissue fragment(s): 1 Size: 0.5 x 0.3 x 0.1 cm Description: Pink soft tissue fragment Entirely submitted in 1 cassette.  CM  01/09/2022  Final Diagnosis performed by Allena Napoleon, MD.   Electronically signed 01/10/2022 4:33:15PM The electronic signature indicates that the named Attending Pathologist has evaluated the specimen Technical component performed at [facility], [address] [place], [place] [address] Lab: [phone] Dir: Rush Farmer, MD, MMM  Professional component performed at [facility], [facility], [place], [place], [place] [address] Lab: [id number] Dir: Kathi Simpers, MD   Lipid panel     Status: None   Collection Time: 02/24/22 10:21 AM  Result Value Ref Range   Cholesterol 146 <200 mg/dL   HDL 62 > OR = 40 mg/dL   Triglycerides 47 <150 mg/dL   LDL Cholesterol (Calc) 71 mg/dL (calc)    Comment: Reference range: <100 . Desirable range <100 mg/dL for primary prevention;   <70 mg/dL for patients with CHD or diabetic patients  with > or = 2 CHD risk factors. Marland Kitchen LDL-C is now calculated using the Martin-Hopkins  calculation, which is a validated novel method providing  better accuracy than the Friedewald equation in the  estimation of LDL-C.  Cresenciano Genre et al. Annamaria Helling. 9292;446(28): 2061-2068  (http://education.[facility].com/faq/FAQ164)    Total CHOL/HDL Ratio 2.4 <5.0 (calc)   Non-HDL Cholesterol (Calc) 84 <130 mg/dL (calc)    Comment: For patients with diabetes plus 1 major ASCVD risk  factor, treating to a non-HDL-C goal of <100 mg/dL  (LDL-C of <70 mg/dL) is considered a therapeutic  option.   CBC with Differential/Platelet     Status: None   Collection Time: 02/24/22 10:21 AM  Result Value Ref Range   WBC 6.1 3.8 - 10.8 Thousand/uL   RBC 5.04 4.20 - 5.80 Million/uL   Hemoglobin 14.4 13.2 - 17.1 g/dL   HCT 43.6 38.5 - 50.0 %   MCV 86.5 80.0 - 100.0 fL   MCH 28.6 27.0 - 33.0 pg   MCHC 33.0 32.0 - 36.0 g/dL   RDW 13.8 11.0 - 15.0 %   Platelets 194 140 - 400 Thousand/uL   MPV 9.3 7.5 - 12.5 fL   Neutro  Abs 3,026 1,500 - 7,800 cells/uL   Lymphs Abs  2,123 850 - 3,900 cells/uL   Absolute Monocytes 628 200 - 950 cells/uL   Eosinophils Absolute 281 15 - 500 cells/uL   Basophils Absolute 43 0 - 200 cells/uL   Neutrophils Relative % 49.6 %   Total Lymphocyte 34.8 %   Monocytes Relative 10.3 %   Eosinophils Relative 4.6 %   Basophils Relative 0.7 %  COMPLETE METABOLIC PANEL WITH GFR     Status: None   Collection Time: 02/24/22 10:21 AM  Result Value Ref Range   Glucose, Bld 85 65 - 99 mg/dL    Comment: .            Fasting reference interval .    BUN 16 7 - 25 mg/dL   Creat 0.94 0.70 - 1.28 mg/dL   eGFR 85 > OR = 60 mL/min/1.38m   BUN/Creatinine Ratio SEE NOTE: 6 - 22 (calc)    Comment:    Not Reported: BUN and Creatinine are within    reference range. .    Sodium 138 135 - 146 mmol/L   Potassium 4.7 3.5 - 5.3 mmol/L   Chloride 104 98 - 110 mmol/L   CO2 28 20 - 32 mmol/L   Calcium 9.4 8.6 - 10.3 mg/dL   Total Protein 7.1 6.1 - 8.1 g/dL   Albumin 4.2 3.6 - 5.1 g/dL   Globulin 2.9 1.9 - 3.7 g/dL (calc)   AG Ratio 1.4 1.0 - 2.5 (calc)   Total Bilirubin 0.7 0.2 - 1.2 mg/dL   Alkaline phosphatase (APISO) 95 35 - 144 U/L   AST 26 10 - 35 U/L   ALT 19 9 - 46 U/L  TSH     Status: Abnormal   Collection Time: 02/24/22 10:21 AM  Result Value Ref Range   TSH 0.39 (L) 0.40 - 4.50 mIU/L  Hemoglobin A1c     Status: Abnormal   Collection Time: 02/24/22 10:21 AM  Result Value Ref Range   Hgb A1c MFr Bld 5.9 (H) <5.7 % of total Hgb    Comment: For someone without known diabetes, a hemoglobin  A1c value between 5.7% and 6.4% is consistent with prediabetes and should be confirmed with a  follow-up test. . For someone with known diabetes, a value <7% indicates that their diabetes is well controlled. A1c targets should be individualized based on duration of diabetes, age, comorbid conditions, and other considerations. . This assay result is consistent with an increased risk of diabetes. . Currently, no consensus exists regarding  use of hemoglobin A1c for diagnosis of diabetes for children. .    Mean Plasma Glucose 123 mg/dL   eAG (mmol/L) 6.8 mmol/L    Assessment/Plan:  Abdominal aortic aneurysm (AAA) without rupture Duplex today shows an approximately 3.2 cm abdominal aortic aneurysm in the proximal infrarenal aorta.  This is minimally changed from his screening ultrasound back in 2020.  He was just over 3 cm at that time.  We discussed the pathophysiology and natural history of abdominal aortic aneurysms with the patient today.  We discussed the reason and rationale for treatment and that in the infrarenal aorta 5 cm is generally our threshold for consideration for repair.  At this point, for an aneurysm greater than 3 cm, an annual follow-up with duplex would be appropriate.  We will see him back in 1 year with follow-up studies and he will contact our office with problems in the interim.  Hypertension, essential, benign blood  pressure control important in reducing the progression of atherosclerotic disease and aneurysmal growth. On appropriate oral medications.   Dyslipidemia lipid control important in reducing the progression of atherosclerotic disease. Continue statin therapy      Leotis Pain 03/07/2022, 9:56 AM   This note was created with Dragon medical transcription system.  Any errors from dictation are unintentional.

## 2022-03-07 NOTE — Assessment & Plan Note (Signed)
blood pressure control important in reducing the progression of atherosclerotic disease and aneurysmal growth. On appropriate oral medications.  

## 2022-03-09 ENCOUNTER — Ambulatory Visit: Payer: PPO

## 2022-03-09 DIAGNOSIS — I10 Essential (primary) hypertension: Secondary | ICD-10-CM

## 2022-03-09 NOTE — Progress Notes (Signed)
Patient here for 2 week follow-up blood pressure check since restarting Losartan '50mg'$ .  Patient denies any side effects.  Blood pressure today is 130/68.

## 2022-03-20 DIAGNOSIS — H903 Sensorineural hearing loss, bilateral: Secondary | ICD-10-CM | POA: Diagnosis not present

## 2022-03-23 ENCOUNTER — Other Ambulatory Visit: Payer: Self-pay

## 2022-03-27 ENCOUNTER — Encounter (INDEPENDENT_AMBULATORY_CARE_PROVIDER_SITE_OTHER): Payer: Self-pay

## 2022-04-09 ENCOUNTER — Other Ambulatory Visit: Payer: Self-pay

## 2022-04-10 ENCOUNTER — Other Ambulatory Visit: Payer: Self-pay

## 2022-04-21 ENCOUNTER — Other Ambulatory Visit: Payer: Self-pay

## 2022-05-23 ENCOUNTER — Other Ambulatory Visit: Payer: Self-pay

## 2022-05-23 MED FILL — Temazepam Cap 15 MG: ORAL | 90 days supply | Qty: 90 | Fill #1 | Status: AC

## 2022-06-22 ENCOUNTER — Other Ambulatory Visit: Payer: Self-pay | Admitting: Gastroenterology

## 2022-06-23 ENCOUNTER — Other Ambulatory Visit: Payer: Self-pay

## 2022-06-23 MED ORDER — PANTOPRAZOLE SODIUM 40 MG PO TBEC
40.0000 mg | DELAYED_RELEASE_TABLET | Freq: Every day | ORAL | 5 refills | Status: DC
  Filled 2022-06-23: qty 30, 30d supply, fill #0
  Filled 2022-07-19: qty 30, 30d supply, fill #1
  Filled 2022-08-22: qty 30, 30d supply, fill #2
  Filled 2022-09-17: qty 30, 30d supply, fill #3
  Filled 2022-10-30: qty 30, 30d supply, fill #4
  Filled 2022-11-25: qty 30, 30d supply, fill #5

## 2022-06-26 ENCOUNTER — Telehealth: Payer: Self-pay | Admitting: Family Medicine

## 2022-06-26 NOTE — Telephone Encounter (Signed)
Lvm for pt informing him that I had to re-schedule his AWV from 08/29/22 to 08/25/22. Due to no NHA in the office.

## 2022-07-10 ENCOUNTER — Ambulatory Visit: Payer: PPO | Admitting: Internal Medicine

## 2022-07-10 NOTE — Progress Notes (Signed)
Pt canceled his appointment.  

## 2022-08-23 NOTE — Progress Notes (Unsigned)
Name: Dustin Cobb   MRN: [id number]    DOB: February 28, 1946   Date:08/24/2022       Progress Note  Subjective  Chief Complaint  Follow Up  HPI  Hyperlipidemia: taking Rosuvastatin now, no side effects, no chest pain or myalgias, last LDL went up a little to 71, but still at goal   HTN: he has been taking losartan 50 mg and tolerating it well, bp today towards low end of normal but at home around 120's/80's, he states very seldom gets dizzy when standing up quickly. No headaches or chest pain   Abdominal aorta aneurysm: found on screen [place] in 2020 , size was 3.1 cm and repeat study done 02/2022 was stable size was 3.2 cm and he will go back in a couple of years for follow up. Taking statins , cannot tolerate aspirin   Hyperglycemia:  hgbA1C has been stable  last level was up from 5.7 % to 5.9 % No polyphagia, polydipsia or polyuria He is trying to not eat desserts after every meal, weight is down 2 lbs    Low TSH: seen by Dr. Gabriel Carina in the past , TSH has been slightly suppressed but stable.    GERD: he had a repeat Endoscopy and showed chronic gastritis with some active disease and he is now taking Pantoprazole and symptoms are still present and has to take something otc daily, advised to contact Dr. Durwin Reges to see if medication can be changed   OSA: he has been wearing CPAP machine almost every night. . He wakes up feeling rested most days. He states no leakage , pressure seems okay for him , he states sometimes has restless sleep, melatonin did not help and he stopped He wakes up feeling tired, we sent rx for Belsomra but not covered by insurance so we changed to Temazepam, he is sleeping better no longer waking up during the night, he states no longer waking up feeling tired every day   Obesity: discussed trying to go down to below 200 lbs. He is more aware of portion sizes, he is eating more vegetables, now cutting down on desserts and weight is down a little since last visit .   AR: on nasal  steroid and claritin, still has intermittent symptoms. Worse during Fall and Spring . He still has medications at home   ED: he states he tried viagra years ago and it caused headache, he would like to try something else. He states does not have intercourse often but would like to have something to help if needed   [place] Mass: MRI ordered by ENT for evaluation of hearing loss, incidental finding of pituitary adenoma, seen by Dr. Lacinda Axon and per patient he said to repeat in 2025 since stable findings. .  He denies double vision, balance problems or weakness   Impression/Plan:  Dustin Cobb is here for evaluation of an abnormality found on MRI of the brain. We did discuss that there is some small area of hypoenhancing focus which could be a small nodule. Given this, I would recommend laboratory evaluation of the pituitary gland. However, no other treatment is needed at this time if those labs are normal. I do not suspect any vision problems from this given the small nature. We did discuss the natural history of nodules in this area and it is most likely benign. I cannot prove this is neoplastic at this time and therefore I do recommend a repeat MRI in 1 year. He agrees to this plan.  Repeat in April 2023   IMPRESSION: 1. Equivocal 3 mm hypoenhancing focus only seen on coronal postcontrast images within the pituitary gland at midline, may represent a pituitary macro adenoma, Rathke's cleft cyst versus artifact. 2. Mild chronic microvascular ischemic changes of the white matter.   Patient Active Problem List   Diagnosis Date Noted   Gastritis without bleeding    Aortic aneurysm without rupture ([place]) 08/23/2021   Pituitary microadenoma ([place]) 08/23/2021   Abdominal aortic aneurysm (AAA) without rupture ([place]) 08/23/2021   OSA treated with BiPAP 07/11/2021   Encounter for BiPAP use counseling 07/11/2021   Encounter for screening colonoscopy    Lichen simplex chronicus 02/13/2018   Rash of hands  08/10/2016   Hypertension, essential, benign 01/07/2015   GERD (gastroesophageal reflux disease) 01/07/2015   OSA on CPAP 01/07/2015   Low TSH level 01/07/2015   Hyperglycemia 01/07/2015   Obesity (BMI 30.0-34.9) 01/07/2015   Dyslipidemia 01/07/2015   History of hemorrhoidectomy 01/07/2015   History of iron deficiency anemia 01/07/2015   Allergic rhinitis [id number]   Metabolic syndrome [id number]    Past Surgical History:  Procedure Laterality Date   CARDIAC CATHETERIZATION     COLONOSCOPY     COLONOSCOPY WITH PROPOFOL N/A 12/03/2020   Procedure: COLONOSCOPY WITH PROPOFOL;  Surgeon: Lucilla Lame, MD;  [facility];  Service: Endoscopy;  Laterality: N/A;   ESOPHAGOGASTRODUODENOSCOPY (EGD) WITH PROPOFOL N/A 01/09/2022   Procedure: ESOPHAGOGASTRODUODENOSCOPY (EGD) WITH PROPOFOL;  Surgeon: Lucilla Lame, MD;  Location: [facility] END[facility]COPY;  Service: Endoscopy;  Laterality: N/A;   HEMORRHOID BANDING  04/01/2012   Dr. Fleet Contras    Family History  Problem Relation Age of Onset   Cancer Mother        Thyroid   Emphysema Father    COPD Father    Hypertension Sister    Hypertension Brother    Heart disease Brother    Healthy Daughter     Social History   Tobacco Use   Smoking status: Former    Packs/day: 1.00    Years: 20.00    Additional pack years: 0.00    Total pack years: 20.00    Types: Cigarettes    Start date: 01/07/1975    Quit date: 01/07/1995    Years since quitting: 27.6   Smokeless tobacco: Never   Tobacco comments:    smoking cessation materials not required  Substance Use Topics   Alcohol use: No     Current Outpatient Medications:    augmented betamethasone dipropionate (DIPROLENE-AF) 0.05 % cream, 1(one) application(s) topical 2(two) times a day, Disp: 50 g, Rfl: 1   azelastine (ASTELIN) 0.1 % nasal spray, Place 1 spray into both nostrils 2 (two) times daily. Use in each nostril as directed, Disp: 30 mL, Rfl: 12   Cholecalciferol (VITAMIN D3)  1000 UNITS CAPS, Take 1 capsule by mouth daily., Disp: , Rfl:    ferrous sulfate 325 (65 FE) MG tablet, Take 1 tablet by mouth daily., Disp: , Rfl:    fexofenadine (ALLEGRA) 180 MG tablet, Take 180 mg by mouth daily., Disp: , Rfl:    pantoprazole (PROTONIX) 40 MG tablet, Take 1 tablet (40 mg total) by mouth daily., Disp: 30 tablet, Rfl: 5   fluticasone (FLONASE) 50 MCG/ACT nasal spray, Place 2 sprays into both nostrils daily. (Patient not taking: Reported on 08/24/2022), Disp: 16 g, Rfl: 5   losartan (COZAAR) 50 MG tablet, Take 1 tablet (50 mg total) by mouth daily., Disp: 90 tablet, Rfl: 1  rosuvastatin (CRESTOR) 20 MG tablet, TAKE 1 TABLET BY MOUTH DAILY., Disp: 90 tablet, Rfl: 1   temazepam (RESTORIL) 15 MG capsule, Take 1 capsule (15 mg total) by mouth at bedtime as needed for sleep., Disp: 90 capsule, Rfl: 1  No Known Allergies  I personally reviewed active problem list, medication list, allergies, family history, social history, health maintenance with the patient/caregiver today.   ROS  Constitutional: Negative for fever or weight change.  Respiratory: Negative for cough and shortness of breath.   Cardiovascular: Negative for chest pain or palpitations.  Gastrointestinal: Negative for abdominal pain, no bowel changes.  Musculoskeletal: Negative for gait problem or joint swelling.  Skin: Negative for rash.  Neurological: Negative for dizziness or headache.  No other specific complaints in a complete review of systems (except as listed in HPI above).   Objective  Vitals:   08/24/22 0850  BP: 112/74  Pulse: 88  Resp: 16  Temp: 97.9 F (36.6 C)  TempSrc: Oral  SpO2: 92%  Weight: 203 lb (92.1 kg)  Height: 5\' 8"  (1.727 m)    Body mass index is 30.87 kg/m.  Physical Exam  Constitutional: Patient appears well-developed and well-nourished. Obese  No distress.  HEENT: head atraumatic, normocephalic, pupils equal and reactive to light, neck supple Cardiovascular: Normal  rate, regular rhythm and normal heart sounds.  No murmur heard. No BLE edema. Pulmonary/Chest: Effort normal and breath sounds normal. No respiratory distress. Abdominal: Soft.  There is no tenderness. Psychiatric: Patient has a normal mood and affect. behavior is normal. Judgment and thought content normal.    PHQ2/9:    08/24/2022    8:52 AM 02/24/2022    9:46 AM 08/23/2021    1:46 PM 08/23/2021    1:38 PM 02/23/2021    8:34 AM  Depression screen PHQ 2/9  Decreased Interest 0 0 0 0 0  Down, Depressed, Hopeless 1 0 0 0 0  PHQ - 2 Score 1 0 0 0 0  Altered sleeping 3 0 0 0   Tired, decreased energy 2 0 0 0   Change in appetite 0 0 0 0   Feeling bad or failure about yourself  0 0 0 0   Trouble concentrating 0 0 0 0   Moving slowly or fidgety/restless 0 0 0 0   Suicidal thoughts 0 0 0 0   PHQ-9 Score 6 0 0 0   Difficult doing work/chores Not difficult at all        phq 9 is negative   Fall Risk:    08/24/2022    8:52 AM 02/24/2022    9:46 AM 08/23/2021    1:48 PM 08/23/2021    1:38 PM 02/23/2021    8:34 AM  Fall Risk   Falls in the past year? 0 0 0 0 0  Number falls in past yr:   0 0 0  Injury with Fall?   0 0 0  Risk for fall due to : No Fall Risks No Fall Risks No Fall Risks No Fall Risks No Fall Risks  Follow up Falls prevention discussed Falls prevention discussed;Education provided;Falls evaluation completed Falls prevention discussed Falls prevention discussed Falls prevention discussed     Functional Status Survey: Is the patient deaf or have difficulty hearing?: Yes Does the patient have difficulty seeing, even when wearing glasses/contacts?: No Does the patient have difficulty concentrating, remembering, or making decisions?: No Does the patient have difficulty walking or climbing stairs?: No Does the patient have difficulty dressing  or bathing?: No Does the patient have difficulty doing errands alone such as visiting a doctor's office or shopping?:  No    Assessment & Plan  1. Aortic aneurysm without rupture, unspecified portion of aorta (HCC)  - rosuvastatin (CRESTOR) 20 MG tablet; TAKE 1 TABLET BY MOUTH DAILY.  Dispense: 90 tablet; Refill: 1  2. Pituitary microadenoma ([place])  Continue follow up with neurosurgeon   3. Primary insomnia  - temazepam (RESTORIL) 15 MG capsule; Take 1 capsule (15 mg total) by mouth at bedtime as needed for sleep.  Dispense: 90 capsule; Refill: 1  4. Hypertension, essential, benign  - losartan (COZAAR) 50 MG tablet; Take 1 tablet (50 mg total) by mouth daily.  Dispense: 90 tablet; Refill: 1  5. Dyslipidemia  - rosuvastatin (CRESTOR) 20 MG tablet; TAKE 1 TABLET BY MOUTH DAILY.  Dispense: 90 tablet; Refill: 1  6. OSA on CPAP   7. Subclinical hyperthyroidism  We will monitor it yearly   8. Perennial allergic rhinitis with seasonal variation   9. Gastroesophageal reflux disease without esophagitis   Advised to contact Dr. Allen Norris, Pantoprazole does not seem to be controlling symptoms   10. Other male erectile dysfunction  - tadalafil (CIALIS) 10 MG tablet; Take 1 tablet (10 mg total) by mouth every other day as needed for erectile dysfunction.  Dispense: 10 tablet; Refill: 1

## 2022-08-24 ENCOUNTER — Encounter: Payer: Self-pay | Admitting: Family Medicine

## 2022-08-24 ENCOUNTER — Other Ambulatory Visit: Payer: Self-pay

## 2022-08-24 ENCOUNTER — Ambulatory Visit (INDEPENDENT_AMBULATORY_CARE_PROVIDER_SITE_OTHER): Payer: PPO | Admitting: Family Medicine

## 2022-08-24 VITALS — BP 112/74 | HR 88 | Temp 97.9°F | Resp 16 | Ht 68.0 in | Wt 203.0 lb

## 2022-08-24 DIAGNOSIS — E785 Hyperlipidemia, unspecified: Secondary | ICD-10-CM | POA: Diagnosis not present

## 2022-08-24 DIAGNOSIS — D352 Benign neoplasm of pituitary gland: Secondary | ICD-10-CM

## 2022-08-24 DIAGNOSIS — J302 Other seasonal allergic rhinitis: Secondary | ICD-10-CM | POA: Diagnosis not present

## 2022-08-24 DIAGNOSIS — I10 Essential (primary) hypertension: Secondary | ICD-10-CM | POA: Diagnosis not present

## 2022-08-24 DIAGNOSIS — J3089 Other allergic rhinitis: Secondary | ICD-10-CM | POA: Diagnosis not present

## 2022-08-24 DIAGNOSIS — G4733 Obstructive sleep apnea (adult) (pediatric): Secondary | ICD-10-CM | POA: Diagnosis not present

## 2022-08-24 DIAGNOSIS — I719 Aortic aneurysm of unspecified site, without rupture: Secondary | ICD-10-CM | POA: Diagnosis not present

## 2022-08-24 DIAGNOSIS — N528 Other male erectile dysfunction: Secondary | ICD-10-CM | POA: Diagnosis not present

## 2022-08-24 DIAGNOSIS — F5101 Primary insomnia: Secondary | ICD-10-CM | POA: Diagnosis not present

## 2022-08-24 DIAGNOSIS — K219 Gastro-esophageal reflux disease without esophagitis: Secondary | ICD-10-CM

## 2022-08-24 DIAGNOSIS — E0590 Thyrotoxicosis, unspecified without thyrotoxic crisis or storm: Secondary | ICD-10-CM

## 2022-08-24 MED ORDER — TEMAZEPAM 15 MG PO CAPS
15.0000 mg | ORAL_CAPSULE | Freq: Every evening | ORAL | 1 refills | Status: DC | PRN
  Filled 2022-08-24: qty 90, 90d supply, fill #0
  Filled 2022-12-27: qty 90, 90d supply, fill #1

## 2022-08-24 MED ORDER — TADALAFIL 10 MG PO TABS: 10.0000 mg | ORAL_TABLET | ORAL | 1 refills | Status: DC | PRN

## 2022-08-24 MED ORDER — LOSARTAN POTASSIUM 50 MG PO TABS
50.0000 mg | ORAL_TABLET | Freq: Every day | ORAL | 1 refills | Status: DC
  Filled 2022-08-24 – 2022-09-17 (×2): qty 90, 90d supply, fill #0
  Filled 2022-12-19: qty 90, 90d supply, fill #1

## 2022-08-24 MED ORDER — ROSUVASTATIN CALCIUM 20 MG PO TABS
ORAL_TABLET | Freq: Every day | ORAL | 1 refills | Status: DC
  Filled 2022-08-24: qty 90, fill #0
  Filled 2022-09-17: qty 90, 90d supply, fill #0
  Filled 2023-01-14: qty 90, 90d supply, fill #1

## 2022-08-24 NOTE — Addendum Note (Signed)
Addended by: Loistine Chance on: 08/24/2022 09:32 AM   Modules accepted: Orders

## 2022-08-25 ENCOUNTER — Ambulatory Visit (INDEPENDENT_AMBULATORY_CARE_PROVIDER_SITE_OTHER): Payer: PPO

## 2022-08-25 VITALS — BP 118/72 | Ht 68.0 in | Wt 202.0 lb

## 2022-08-25 DIAGNOSIS — Z0000 Encounter for general adult medical examination without abnormal findings: Secondary | ICD-10-CM | POA: Diagnosis not present

## 2022-08-25 NOTE — Patient Instructions (Addendum)
Dustin Cobb , Thank you for taking time to come for your Medicare Wellness Visit. I appreciate your ongoing commitment to your health goals. Please review the following plan we discussed and let me know if I can assist you in the future.   These are the goals we discussed:  Goals      DIET - INCREASE WATER INTAKE     Recommend to drink at least 6-8 8oz glasses of water per day.     Increase physical activity     Recommend increasing physical activity to at least 3 days per week         This is a list of the screening recommended for you and due dates:  Health Maintenance  Topic Date Due   COVID-19 Vaccine (3 - Moderna risk series) 09/09/2022*   Zoster (Shingles) Vaccine (1 of 2) 11/23/2022*   Medicare Annual Wellness Visit  08/25/2023   DTaP/Tdap/Td vaccine (3 - Td or Tdap) 08/24/2030   Colon Cancer Screening  12/04/2030   Pneumonia Vaccine  Completed   Flu Shot  Completed   Hepatitis C Screening: USPSTF Recommendation to screen - Ages 18-79 yo.  Completed   HPV Vaccine  Aged Out  *Topic was postponed. The date shown is not the original due date.    Advanced directives: no  Conditions/risks identified: none  Next appointment: Follow up in one year for your annual wellness visit. 08/31/2023 @10  am in person  Preventive Care 65 Years and Older, Male  Preventive care refers to lifestyle choices and visits with your health care provider that can promote health and wellness. What does preventive care include? A yearly physical exam. This is also called an annual well check. Dental exams once or twice a year. Routine eye exams. Ask your health care provider how often you should have your eyes checked. Personal lifestyle choices, including: Daily care of your teeth and gums. Regular physical activity. Eating a healthy diet. Avoiding tobacco and drug use. Limiting alcohol use. Practicing safe sex. Taking low doses of aspirin every day. Taking vitamin and mineral  supplements as recommended by your health care provider. What happens during an annual well check? The services and screenings done by your health care provider during your annual well check will depend on your age, overall health, lifestyle risk factors, and family history of disease. Counseling  Your health care provider may ask you questions about your: Alcohol use. Tobacco use. Drug use. Emotional well-being. Home and relationship well-being. Sexual activity. Eating habits. History of falls. Memory and ability to understand (cognition). Work and work Statistician. Screening  You may have the following tests or measurements: Height, weight, and BMI. Blood pressure. Lipid and cholesterol levels. These may be checked every 5 years, or more frequently if you are over 76 years old. Skin check. Lung cancer screening. You may have this screening every year starting at age 76 if you have a 30-pack-year history of smoking and currently smoke or have quit within the past 15 years. Fecal occult blood test (FOBT) of the stool. You may have this test every year starting at age 76. Flexible sigmoidoscopy or colonoscopy. You may have a sigmoidoscopy every 5 years or a colonoscopy every 10 years starting at age 76. Prostate cancer screening. Recommendations will vary depending on your family history and other risks. Hepatitis C blood test. Hepatitis B blood test. Sexually transmitted disease (STD) testing. Diabetes screening. This is done by checking your blood sugar (glucose) after you have not eaten for  a while (fasting). You may have this done every 1-3 years. Abdominal aortic aneurysm (AAA) screening. You may need this if you are a current or former smoker. Osteoporosis. You may be screened starting at age 76 if you are at high risk. Talk with your health care provider about your test results, treatment options, and if necessary, the need for more tests. Vaccines  Your health care provider  may recommend certain vaccines, such as: Influenza vaccine. This is recommended every year. Tetanus, diphtheria, and acellular pertussis (Tdap, Td) vaccine. You may need a Td booster every 10 years. Zoster vaccine. You may need this after age 76. Pneumococcal 13-valent conjugate (PCV13) vaccine. One dose is recommended after age 76. Pneumococcal polysaccharide (PPSV23) vaccine. One dose is recommended after age 76. Talk to your health care provider about which screenings and vaccines you need and how often you need them. This information is not intended to replace advice given to you by your health care provider. Make sure you discuss any questions you have with your health care provider. Document Released: 06/11/2015 Document Revised: 02/02/2016 Document Reviewed: 03/16/2015 Elsevier Interactive Patient Education  2017 [employer] Prevention in the Home Falls can cause injuries. They can happen to people of all ages. There are many things you can do to make your home safe and to help prevent falls. What can I do on the outside of my home? Regularly fix the edges of walkways and driveways and fix any cracks. Remove anything that might make you trip as you walk through a door, such as a raised step or threshold. Trim any bushes or trees on the path to your home. Use bright outdoor lighting. Clear any walking paths of anything that might make someone trip, such as rocks or tools. Regularly check to see if handrails are loose or broken. Make sure that both sides of any steps have handrails. Any raised decks and porches should have guardrails on the edges. Have any leaves, snow, or ice cleared regularly. Use sand or salt on walking paths during winter. Clean up any spills in your garage right away. This includes oil or grease spills. What can I do in the bathroom? Use night lights. Install grab bars by the toilet and in the tub and shower. Do not use towel bars as grab bars. Use  non-skid mats or decals in the tub or shower. If you need to sit down in the shower, use a plastic, non-slip stool. Keep the floor dry. Clean up any water that spills on the floor as soon as it happens. Remove soap buildup in the tub or shower regularly. Attach bath mats securely with double-sided non-slip rug tape. Do not have throw rugs and other things on the floor that can make you trip. What can I do in the bedroom? Use night lights. Make sure that you have a light by your bed that is easy to reach. Do not use any sheets or blankets that are too big for your bed. They should not hang down onto the floor. Have a firm chair that has side arms. You can use this for support while you get dressed. Do not have throw rugs and other things on the floor that can make you trip. What can I do in the kitchen? Clean up any spills right away. Avoid walking on wet floors. Keep items that you use a lot in easy-to-reach places. If you need to reach something above you, use a strong step stool that has a  grab bar. Keep electrical cords out of the way. Do not use floor polish or wax that makes floors slippery. If you must use wax, use non-skid floor wax. Do not have throw rugs and other things on the floor that can make you trip. What can I do with my stairs? Do not leave any items on the stairs. Make sure that there are handrails on both sides of the stairs and use them. Fix handrails that are broken or loose. Make sure that handrails are as long as the stairways. Check any carpeting to make sure that it is firmly attached to the stairs. Fix any carpet that is loose or worn. Avoid having throw rugs at the top or bottom of the stairs. If you do have throw rugs, attach them to the floor with carpet tape. Make sure that you have a light switch at the top of the stairs and the bottom of the stairs. If you do not have them, ask someone to add them for you. What else can I do to help prevent falls? Wear  shoes that: Do not have high heels. Have rubber bottoms. Are comfortable and fit you well. Are closed at the toe. Do not wear sandals. If you use a stepladder: Make sure that it is fully opened. Do not climb a closed stepladder. Make sure that both sides of the stepladder are locked into place. Ask someone to hold it for you, if possible. Clearly mark and make sure that you can see: Any grab bars or handrails. First and last steps. Where the edge of each step is. Use tools that help you move around (mobility aids) if they are needed. These include: Canes. Walkers. Scooters. Crutches. Turn on the lights when you go into a dark area. Replace any light bulbs as soon as they burn out. Set up your furniture so you have a clear path. Avoid moving your furniture around. If any of your floors are uneven, fix them. If there are any pets around you, be aware of where they are. Review your medicines with your doctor. Some medicines can make you feel dizzy. This can increase your chance of falling. Ask your doctor what other things that you can do to help prevent falls. This information is not intended to replace advice given to you by your health care provider. Make sure you discuss any questions you have with your health care provider. Document Released: 03/11/2009 Document Revised: 10/21/2015 Document Reviewed: 06/19/2014 Elsevier Interactive Patient Education  2017 [employer].

## 2022-08-25 NOTE — Progress Notes (Signed)
Subjective:   Dustin Cobb is a 77 y.o. male who presents for Medicare Annual/Subsequent preventive examination.  Review of Systems    Cardiac Risk Factors include: advanced age (>84men, >3 women);dyslipidemia;hypertension;male gender;obesity (BMI >30kg/m2);sedentary lifestyle    Objective:    Today's Vitals   08/24/22 1147 08/25/22 1027  BP:  118/72  Weight:  202 lb (91.6 kg)  Height:  5\' 8"  (1.727 m)  PainSc: 0-No pain    Body mass index is 30.71 kg/m.     08/25/2022   10:34 AM 01/09/2022    8:08 AM 08/23/2021    1:47 PM 08/17/2020    1:37 PM 08/14/2019    1:50 PM 08/13/2018    1:41 PM 08/07/2017    1:58 PM  Advanced Directives  Does Patient Have a Medical Advance Directive? No No No No No No No  Would patient like information on creating a medical advance directive?  No - Patient declined No - Patient declined Yes (MAU/Ambulatory/Procedural Areas - Information given) No - Patient declined Yes (MAU/Ambulatory/Procedural Areas - Information given) Yes (MAU/Ambulatory/Procedural Areas - Information given)    Current Medications (verified) Outpatient Encounter Medications as of 08/25/2022  Medication Sig   augmented betamethasone dipropionate (DIPROLENE-AF) 0.05 % cream 1(one) application(s) topical 2(two) times a day   azelastine (ASTELIN) 0.1 % nasal spray Place 1 spray into both nostrils 2 (two) times daily. Use in each nostril as directed   Cholecalciferol (VITAMIN D3) 1000 UNITS CAPS Take 1 capsule by mouth daily.   ferrous sulfate 325 (65 FE) MG tablet Take 1 tablet by mouth daily.   fexofenadine (ALLEGRA) 180 MG tablet Take 180 mg by mouth daily.   fluticasone (FLONASE) 50 MCG/ACT nasal spray Place 2 sprays into both nostrils daily. (Patient not taking: Reported on 08/24/2022)   losartan (COZAAR) 50 MG tablet Take 1 tablet (50 mg total) by mouth daily.   pantoprazole (PROTONIX) 40 MG tablet Take 1 tablet (40 mg total) by mouth daily.   rosuvastatin (CRESTOR) 20 MG  tablet TAKE 1 TABLET BY MOUTH DAILY.   tadalafil (CIALIS) 10 MG tablet Take 1 tablet (10 mg total) by mouth every other day as needed for erectile dysfunction.   temazepam (RESTORIL) 15 MG capsule Take 1 capsule (15 mg total) by mouth at bedtime as needed for sleep.   No facility-administered encounter medications on file as of 08/25/2022.    Allergies (verified) Patient has no known allergies.   History: Past Medical History:  Diagnosis Date   Aching headache    Allergy    ED (erectile dysfunction)    GERD (gastroesophageal reflux disease)    Hyperlipidemia    Hypertension    Insomnia    Metabolic syndrome    Onychomycosis    OSA on CPAP    Postural dizziness    Rash    Sleep apnea    Wears partial dentures    Past Surgical History:  Procedure Laterality Date   CARDIAC CATHETERIZATION     COLONOSCOPY     COLONOSCOPY WITH PROPOFOL N/A 12/03/2020   Procedure: COLONOSCOPY WITH PROPOFOL;  Surgeon: Lucilla Lame, MD;  Location: [facility];  Service: Endoscopy;  Laterality: N/A;   ESOPHAGOGASTRODUODENOSCOPY (EGD) WITH PROPOFOL N/A 01/09/2022   Procedure: ESOPHAGOGASTRODUODENOSCOPY (EGD) WITH PROPOFOL;  Surgeon: Lucilla Lame, MD;  Location: [facility] ENDOSCOPY;  Service: Endoscopy;  Laterality: N/A;   HEMORRHOID BANDING  04/01/2012   Dr. Fleet Contras   Family History  Problem Relation Age of Onset   Cancer Mother  Thyroid   Emphysema Father    COPD Father    Hypertension Sister    Hypertension Brother    Heart disease Brother    Healthy Daughter    Social History   Socioeconomic History   Marital status: Married    Spouse name: [place]   Number of children: 2   Years of education: Not on file   Highest education level: Associate degree: academic program  Occupational History   Occupation: retired  Tobacco Use   Smoking status: Former    Packs/day: 1.00    Years: 20.00    Additional pack years: 0.00    Total pack years: 20.00    Types: Cigarettes    Start  date: 01/07/1975    Quit date: 01/07/1995    Years since quitting: 27.6   Smokeless tobacco: Never   Tobacco comments:    smoking cessation materials not required  Vaping Use   Vaping Use: Never used  Substance and Sexual Activity   Alcohol use: No   Drug use: No   Sexual activity: Yes    Partners: Female    Birth control/protection: None  Other Topics Concern   Not on file  Social History Narrative   Not on file   Social Determinants of Health   Financial Resource Strain: Low Risk  (08/24/2022)   Overall Financial Resource Strain (CARDIA)    Difficulty of Paying Living Expenses: Not hard at all  Food Insecurity: No Food Insecurity (08/24/2022)   Hunger Vital Sign    Worried About Running Out of Food in the Last Year: Never true    Ran Out of Food in the Last Year: Never true  Transportation Needs: No Transportation Needs (08/24/2022)   PRAPARE - Hydrologist (Medical): No    Lack of Transportation (Non-Medical): No  Physical Activity: Insufficiently Active (08/24/2022)   Exercise Vital Sign    Days of Exercise per Week: 1 day    Minutes of Exercise per Session: 10 min  Stress: No Stress Concern Present (08/24/2022)   [place]    Feeling of Stress : Only a little  Social Connections: Moderately Integrated (08/24/2022)   Social Connection and Isolation Panel [NHANES]    Frequency of Communication with Friends and Family: Once a week    Frequency of Social Gatherings with Friends and Family: Once a week    Attends Religious Services: More than 4 times per year    Active Member of [employer] or Organizations: Yes    Attends Music therapist: More than 4 times per year    Marital Status: Married    Tobacco Counseling Counseling given: Not Answered Tobacco comments: smoking cessation materials not required   Clinical Intake:  Pre-visit preparation completed: Yes  Pain :  No/denies pain Pain Score: 0-No pain   BMI - recorded: 30.71 Nutritional Status: BMI > 30  Obese Nutritional Risks: None Diabetes: No  How often do you need to have someone help you when you read instructions, pamphlets, or other written materials from your doctor or pharmacy?: 1 - Never  Diabetic?no  Interpreter Needed?: No  Comments: lives with wife Information entered by :: B.Brandace Cargle,LPN   Activities of Daily Living    08/24/2022   11:47 AM 08/24/2022    8:53 AM  In your present state of health, do you have any difficulty performing the following activities:  Hearing? 1 1  Vision? 0 0  Difficulty  concentrating or making decisions? 0 0  Walking or climbing stairs? 0 0  Dressing or bathing? 0 0  Doing errands, shopping? 0 0  Preparing Food and eating ? N   Using the Toilet? N   In the past six months, have you accidently leaked urine? N   Do you have problems with loss of bowel control? N   Managing your Medications? N   Managing your Finances? N   Housekeeping or managing your Housekeeping? N     Patient Care Team: [name], MD as PCP - General (Family Medicine) Deetta Perla, MD as Consulting Physician (Neurosurgery) Lucilla Lame, MD as Consulting Physician (Gastroenterology) Ree Edman, MD (Dermatology) Allyne Gee, MD as Consulting Physician (Internal Medicine) Beverly Gust, MD (Otolaryngology)  Indicate any recent Medical Services you may have received from other than [facility] providers in the past year (date may be approximate).     Assessment:   This is a routine wellness examination for Dhruva.  Hearing/Vision screen Hearing Screening - Comments:: Inadequate hearing:[facility] Vision Screening - Comments:: Adequate vision Dr Ellin Mayhew  Dietary issues and exercise activities discussed: Current Exercise Habits: The patient does not participate in regular exercise at present, Exercise limited by: None identified   Goals  Addressed             This Visit's Progress    DIET - INCREASE WATER INTAKE   On track    Recommend to drink at least 6-8 8oz glasses of water per day.     Increase physical activity   Not on track    Recommend increasing physical activity to at least 3 days per week        Depression Screen    08/24/2022    8:52 AM 02/24/2022    9:46 AM 08/23/2021    1:46 PM 08/23/2021    1:38 PM 02/23/2021    8:34 AM 08/23/2020    7:53 AM 08/17/2020    1:35 PM  PHQ 2/9 Scores  PHQ - 2 Score 1 0 0 0 0 0 0  PHQ- 9 Score 6 0 0 0  0     Fall Risk    08/24/2022   11:47 AM 08/24/2022    8:52 AM 02/24/2022    9:46 AM 08/23/2021    1:48 PM 08/23/2021    1:38 PM  Fall Risk   Falls in the past year? 0 0 0 0 0  Number falls in past yr:    0 0  Injury with Fall?    0 0  Risk for fall due to :  No Fall Risks No Fall Risks No Fall Risks No Fall Risks  Follow up  Falls prevention discussed Falls prevention discussed;Education provided;Falls evaluation completed Falls prevention discussed Falls prevention discussed    FALL RISK PREVENTION PERTAINING TO THE HOME:  Any stairs in or around the home? Yes  If so, are there any without handrails? Yes  Home free of loose throw rugs in walkways, pet beds, electrical cords, etc? Yes  Adequate lighting in your home to reduce risk of falls? Yes   ASSISTIVE DEVICES UTILIZED TO PREVENT FALLS:  Life alert? No  Use of a cane, walker or w/c? No  Grab bars in the bathroom? No  Shower chair or bench in shower? No  Elevated toilet seat or a handicapped toilet? No   TIMED UP AND GO:  Was the test performed? Yes .  Length of time to ambulate 10 feet: 8  sec.   Gait steady and fast without use of assistive device  Cognitive Function:        08/25/2022   10:34 AM 08/13/2018    1:46 PM 08/07/2017    2:02 PM  6CIT Screen  What Year? 0 points 0 points 0 points  What month? 0 points 0 points 0 points  What time? 0 points 0 points 0 points  Count back from 20  0 points 0 points 0 points  Months in reverse 0 points 0 points 0 points  Repeat phrase 0 points 2 points 4 points  Total Score 0 points 2 points 4 points    Immunizations Immunization History  Administered Date(s) Administered   Fluad Quad(high Dose 65+) 02/12/2019, 02/23/2021, 02/24/2022   Influenza, High Dose Seasonal PF 04/11/2016, 03/05/2017, 02/13/2018   Influenza,inj,Quad PF,6+ Mos 01/07/2015   Influenza-Unspecified 03/02/2014, 02/18/2020   Moderna SARS-COV2 Booster Vaccination 05/04/2020   Moderna Sars-Covid-2 Vaccination 07/21/2019, 08/19/2019   Pneumococcal Conjugate-13 01/07/2015   Pneumococcal Polysaccharide-23 12/18/2013   Tdap 01/29/2009, 08/23/2020   Zoster, Live 07/22/2015    TDAP status: Up to date  Flu Vaccine status: Up to date  Pneumococcal vaccine status: Up to date  Covid-19 vaccine status: Completed vaccines  Qualifies for Shingles Vaccine? Yes   Zostavax completed No   Shingrix Completed?: No.    Education has been provided regarding the importance of this vaccine. Patient has been advised to call insurance company to determine out of pocket expense if they have not yet received this vaccine. Advised may also receive vaccine at local pharmacy or Health Dept. Verbalized acceptance and understanding.  Screening Tests Health Maintenance  Topic Date Due   COVID-19 Vaccine (3 - Moderna risk series) 09/09/2022 (Originally 06/01/2020)   Zoster Vaccines- Shingrix (1 of 2) 11/23/2022 (Originally 11/11/1964)   Medicare Annual Wellness (AWV)  08/25/2023   DTaP/Tdap/Td (3 - Td or Tdap) 08/24/2030   COLONOSCOPY (Pts 45-65yrs Insurance coverage will need to be confirmed)  12/04/2030   Pneumonia Vaccine 4+ Years old  Completed   INFLUENZA VACCINE  Completed   Hepatitis C Screening  Completed   HPV VACCINES  Aged Out    Health Maintenance  There are no preventive care reminders to display for this patient.   Colorectal cancer screening: No longer required.    Lung Cancer Screening: (Low Dose CT Chest recommended if Age 80-80 years, 30 pack-year currently smoking OR have quit w/in 15years.) does not qualify.   Lung Cancer Screening Referral: no  Additional Screening:  Hepatitis C Screening: does not qualify; Completed yes  Vision Screening: Recommended annual ophthalmology exams for early detection of glaucoma and other disorders of the eye. Is the patient up to date with their annual eye exam?  Yes  Who is the provider or what is the name of the office in which the patient attends annual eye exams? Dr Ellin Mayhew If pt is not established with a provider, would they like to be referred to a provider to establish care? No .   Dental Screening: Recommended annual dental exams for proper oral hygiene  Community Resource Referral / Chronic Care Management: CRR required this visit?  No   CCM required this visit?  No      Plan:     I have personally reviewed and noted the following in the patient's chart:   Medical and social history Use of alcohol, tobacco or illicit drugs  Current medications and supplements including opioid prescriptions. Patient is not currently taking opioid prescriptions.  Functional ability and status Nutritional status Physical activity Advanced directives List of other physicians Hospitalizations, surgeries, and ER visits in previous 12 months Vitals Screenings to include cognitive, depression, and falls Referrals and appointments  In addition, I have reviewed and discussed with patient certain preventive protocols, quality metrics, and best practice recommendations. A written personalized care plan for preventive services as well as general preventive health recommendations were provided to patient.     Roger Shelter, LPN   [id number]   Nurse Notes: The patient states they are doing well and has no concerns or questions at this time.

## 2022-09-18 ENCOUNTER — Other Ambulatory Visit: Payer: Self-pay

## 2022-10-09 ENCOUNTER — Ambulatory Visit (INDEPENDENT_AMBULATORY_CARE_PROVIDER_SITE_OTHER): Payer: PPO | Admitting: Internal Medicine

## 2022-10-09 VITALS — BP 122/81 | HR 70 | Resp 20 | Ht 68.0 in | Wt 205.0 lb

## 2022-10-09 DIAGNOSIS — I10 Essential (primary) hypertension: Secondary | ICD-10-CM

## 2022-10-09 DIAGNOSIS — G4733 Obstructive sleep apnea (adult) (pediatric): Secondary | ICD-10-CM

## 2022-10-09 DIAGNOSIS — Z7189 Other specified counseling: Secondary | ICD-10-CM | POA: Diagnosis not present

## 2022-10-09 NOTE — Progress Notes (Unsigned)
[facility] [address] [place], [place] [address]  Pulmonary Sleep Medicine   Office Visit Note  Patient Name: Dustin Cobb DOB: 12-21-1945 MRN [id number]    Chief Complaint: Obstructive Sleep Apnea visit  Brief History:  Dustin Cobb is seen today for an annual follow up on BIPAP 17/12cmh20.  The patient has a 8 year  history of sleep apnea. Patient is not using PAP nightly.  The patient feels sometimes rested after sleeping with PAP.  The patient reports some benefit from PAP use. Reported sleepiness is somewhat improved and the Epworth Sleepiness Score is 9 out of 24. The patient does not take naps. The patient complains of the following: mask leaks and keeps both he and his wife awake.  We discuss the importance of changing out supplies. The compliance download shows  44% compliance with an average use time of 4.5 hours. The AHI is 3.4  The patient does not complain of limb movements disrupting sleep.  ROS  General: (-) fever, (-) chills, (-) night sweat Nose and Sinuses: (-) nasal stuffiness or itchiness, (-) postnasal drip, (-) nosebleeds, (-) sinus trouble. Mouth and Throat: (-) sore throat, (-) hoarseness. Neck: (-) swollen glands, (-) enlarged thyroid, (-) neck pain. Respiratory: - cough, - shortness of breath, - wheezing. Neurologic: - numbness, - tingling. Psychiatric: - anxiety, - depression   Current Medication: Outpatient Encounter Medications as of 10/09/2022  Medication Sig   augmented betamethasone dipropionate (DIPROLENE-AF) 0.05 % cream 1(one) application(s) topical 2(two) times a day   azelastine (ASTELIN) 0.1 % nasal spray Place 1 spray into both nostrils 2 (two) times daily. Use in each nostril as directed   Cholecalciferol (VITAMIN D3) 1000 UNITS CAPS Take 1 capsule by mouth daily.   ferrous sulfate 325 (65 FE) MG tablet Take 1 tablet by mouth daily.   fexofenadine (ALLEGRA) 180 MG tablet Take 180 mg by mouth daily.   fluticasone (FLONASE)  50 MCG/ACT nasal spray Place 2 sprays into both nostrils daily. (Patient not taking: Reported on 08/24/2022)   losartan (COZAAR) 50 MG tablet Take 1 tablet (50 mg total) by mouth daily.   pantoprazole (PROTONIX) 40 MG tablet Take 1 tablet (40 mg total) by mouth daily.   rosuvastatin (CRESTOR) 20 MG tablet TAKE 1 TABLET BY MOUTH DAILY.   tadalafil (CIALIS) 10 MG tablet Take 1 tablet (10 mg total) by mouth every other day as needed for erectile dysfunction.   temazepam (RESTORIL) 15 MG capsule Take 1 capsule (15 mg total) by mouth at bedtime as needed for sleep.   No facility-administered encounter medications on file as of 10/09/2022.    Surgical History: Past Surgical History:  Procedure Laterality Date   CARDIAC CATHETERIZATION     COLONOSCOPY     COLONOSCOPY WITH PROPOFOL N/A 12/03/2020   Procedure: COLONOSCOPY WITH PROPOFOL;  Surgeon: Midge Minium, MD;  Location: [facility];  Service: Endoscopy;  Laterality: N/A;   ESOPHAGOGASTRODUODENOSCOPY (EGD) WITH PROPOFOL N/A 01/09/2022   Procedure: ESOPHAGOGASTRODUODENOSCOPY (EGD) WITH PROPOFOL;  Surgeon: Midge Minium, MD;  Location: [facility];  Service: Endoscopy;  Laterality: N/A;   HEMORRHOID BANDING  04/01/2012   Dr. Birdie Sons    Medical History: Past Medical History:  Diagnosis Date   Aching headache    Allergy    ED (erectile dysfunction)    GERD (gastroesophageal reflux disease)    Hyperlipidemia    Hypertension    Insomnia    Metabolic syndrome    Onychomycosis    OSA on CPAP  Postural dizziness    Rash    Sleep apnea    Wears partial dentures     Family History: Non contributory to the present illness  Social History: Social History   Socioeconomic History   Marital status: Married    Spouse name: Mary   Number of children: 2   Years of education: Not on file   Highest education level: Associate degree: academic program  Occupational History   Occupation: retired  Tobacco Use   Smoking status:  Former    Packs/day: 1.00    Years: 20.00    Additional pack years: 0.00    Total pack years: 20.00    Types: Cigarettes    Start date: 01/07/1975    Quit date: 01/07/1995    Years since quitting: 27.7   Smokeless tobacco: Never   Tobacco comments:    smoking cessation materials not required  Vaping Use   Vaping Use: Never used  Substance and Sexual Activity   Alcohol use: No   Drug use: No   Sexual activity: Yes    Partners: Female    Birth control/protection: None  Other Topics Concern   Not on file  Social History Narrative   Not on file   Social Determinants of Health   Financial Resource Strain: Low Risk  (08/24/2022)   Overall Financial Resource Strain (CARDIA)    Difficulty of Paying Living Expenses: Not hard at all  Food Insecurity: No Food Insecurity (08/24/2022)   Hunger Vital Sign    Worried About Running Out of Food in the Last Year: Never true    Ran Out of Food in the Last Year: Never true  Transportation Needs: No Transportation Needs (08/24/2022)   PRAPARE - Administrator, Civil Service (Medical): No    Lack of Transportation (Non-Medical): No  Physical Activity: Insufficiently Active (08/24/2022)   Exercise Vital Sign    Days of Exercise per Week: 1 day    Minutes of Exercise per Session: 10 min  Stress: No Stress Concern Present (08/24/2022)   [employer] of Occupational Health - Occupational Stress Questionnaire    Feeling of Stress : Only a little  Social Connections: Moderately Integrated (08/24/2022)   Social Connection and Isolation Panel [NHANES]    Frequency of Communication with Friends and Family: Once a week    Frequency of Social Gatherings with Friends and Family: Once a week    Attends Religious Services: More than 4 times per year    Active Member of [employer] or Organizations: Yes    Attends Engineer, structural: More than 4 times per year    Marital Status: Married  Catering manager Violence: Not At Risk (08/23/2021)    Humiliation, Afraid, Rape, and Kick questionnaire    Fear of Current or Ex-Partner: No    Emotionally Abused: No    Physically Abused: No    Sexually Abused: No    Vital Signs: Blood pressure 122/81, pulse 70, resp. rate 20, height 5\' 8"  (1.727 m), weight 205 lb (93 kg), SpO2 98 %. Body mass index is 31.17 kg/m.    Examination: General Appearance: The patient is well-developed, well-nourished, and in no distress. Neck Circumference: 43cm Skin: Gross inspection of skin unremarkable. Head: normocephalic, no gross deformities. Eyes: no gross deformities noted. ENT: ears appear grossly normal Neurologic: Alert and oriented. No involuntary movements.  STOP BANG RISK ASSESSMENT S (snore) Have you been told that you snore?     YES   T (tired)  Are you often tired, fatigued, or sleepy during the day?   NO  O (obstruction) Do you stop breathing, choke, or gasp during sleep? NO   P (pressure) Do you have or are you being treated for high blood pressure? YES   B (BMI) Is your body index greater than 35 kg/m? NO   A (age) Are you 99 years old or older? YES   N (neck) Do you have a neck circumference greater than 16 inches?   YES   G (gender) Are you a male? YES   TOTAL STOP/BANG "YES" ANSWERS 5       A STOP-Bang score of 2 or less is considered low risk, and a score of 5 or more is high risk for having either moderate or severe OSA. For people who score 3 or 4, doctors may need to perform further assessment to determine how likely they are to have OSA.         EPWORTH SLEEPINESS SCALE:  Scale:  (0)= no chance of dozing; (1)= slight chance of dozing; (2)= moderate chance of dozing; (3)= high chance of dozing  Chance  Situtation    Sitting and reading: 3    Watching TV: 1    Sitting Inactive in public: 0    As a passenger in car: 0      Lying down to rest: 3    Sitting and talking: 0    Sitting quielty after lunch: 2    In a car, stopped in traffic: 0   TOTAL  SCORE:   9 out of 24    SLEEP STUDIES:  PSG (07/23/14) AHI 10, REM AHI 42, min SPO2 87%   CPAP COMPLIANCE DATA:  Date Range: 10/09/2021-10/08/1022  Average Daily Use: 4.33 hours  Median Use: 5 hrs 14 min  Compliance for > 4 Hours: 44% days  AHI: 3.4 respiratory events per hour  Days Used: 237/365  Mask Leak: 56.6  95th Percentile Pressure: 17/12         LABS: No results found for this or any previous visit (from the past 2160 hour(s)).  Radiology: No results found.  No results found.  No results found.    Assessment and Plan: Patient Active Problem List   Diagnosis Date Noted   Gastritis without bleeding    Aortic aneurysm without rupture (HCC) 08/23/2021   Pituitary microadenoma (HCC) 08/23/2021   Abdominal aortic aneurysm (AAA) without rupture (HCC) 08/23/2021   OSA treated with BiPAP 07/11/2021   Encounter for BiPAP use counseling 07/11/2021   Encounter for screening colonoscopy    Lichen simplex chronicus 02/13/2018   Rash of hands 08/10/2016   Hypertension, essential, benign 01/07/2015   GERD (gastroesophageal reflux disease) 01/07/2015   OSA on CPAP 01/07/2015   Low TSH level 01/07/2015   Hyperglycemia 01/07/2015   Obesity (BMI 30.0-34.9) 01/07/2015   Dyslipidemia 01/07/2015   History of hemorrhoidectomy 01/07/2015   History of iron deficiency anemia 01/07/2015   Allergic rhinitis 01/07/2015   Metabolic syndrome 01/07/2015   1. OSA treated with BiPAP The patient does tolerate PAP and reports  benefit from PAP use. His compliance is low. We discussed this. Machine is past end of life and must be replaced. He will work on compliance. We will f/u in 75m. The patient was reminded how to clean equipment and advised to replace supplies more frequently  in light of leak. The patient was also counselled on weight loss. The compliance is poor. The AHI is 3.4.  OSA on cpap- controlled but has poor compliance. Needs new machine. F/u in 3 m for  compliance and visit to replace machine.  CPAP continues to be medically necessary to treat this patient's OSA.    2. Encounter for BiPAP use counseling Counseling: had a lengthy discussion with the patient regarding the importance of PAP therapy in management of the sleep apnea. Patient appears to understand the risk factor reduction and also understands the risks associated with untreated sleep apnea. Patient will try to make a good faith effort to remain compliant with therapy. Also instructed the patient on proper cleaning of the device including the water must be changed daily if possible and use of distilled water is preferred. Patient understands that the machine should be regularly cleaned with appropriate recommended cleaning solutions that do not damage the PAP machine for example given white vinegar and water rinses. Other methods such as ozone treatment may not be as good as these simple methods to achieve cleaning.   3. Hypertension, essential, benign Hypertension Counseling:   The following hypertensive lifestyle modification were recommended and discussed:  1. Limiting alcohol intake to less than 1 oz/day of ethanol:(24 oz of beer or 8 oz of wine or 2 oz of 100-proof whiskey). 2. Take baby ASA 81 mg daily. 3. Importance of regular aerobic exercise and losing weight. 4. Reduce dietary saturated fat and cholesterol intake for overall cardiovascular health. 5. Maintaining adequate dietary potassium, calcium, and magnesium intake. 6. Regular monitoring of the blood pressure. 7. Reduce sodium intake to less than 100 mmol/day (less than 2.3 gm of sodium or less than 6 gm of sodium choride)       General Counseling: I have discussed the findings of the evaluation and examination with Josep.  I have also discussed any further diagnostic evaluation thatmay be needed or ordered today. Davyn verbalizes understanding of the findings of todays visit. We also reviewed his medications today and  discussed drug interactions and side effects including but not limited excessive drowsiness and altered mental states. We also discussed that there is always a risk not just to him but also people around him. he has been encouraged to call the office with any questions or concerns that should arise related to todays visit.  No orders of the defined types were placed in this encounter.       I have personally obtained a history, examined the patient, evaluated laboratory and imaging results, formulated the assessment and plan and placed orders. This patient was seen today by Emmaline Kluver, PA-C in collaboration with Dr. Freda Munro.   Yevonne Pax, MD [facility] Diplomate ABMS Pulmonary Critical Care Medicine and Sleep Medicine

## 2022-10-09 NOTE — Patient Instructions (Signed)

## 2022-10-20 ENCOUNTER — Other Ambulatory Visit: Payer: Self-pay

## 2022-10-20 DIAGNOSIS — G4733 Obstructive sleep apnea (adult) (pediatric): Secondary | ICD-10-CM

## 2022-10-20 NOTE — Telephone Encounter (Signed)
Order placed, will fax once signed

## 2022-10-20 NOTE — Telephone Encounter (Unsigned)
Copied from CRM [id number]. Topic: General - Other >> Oct 20, 2022  8:33 AM Everette C wrote: Reason for CRM: The patient has called to request that an order for CPAP supplies be submitted to [facility]  Please contact the patient further if needed

## 2022-10-26 DIAGNOSIS — G4733 Obstructive sleep apnea (adult) (pediatric): Secondary | ICD-10-CM | POA: Diagnosis not present

## 2022-10-30 ENCOUNTER — Other Ambulatory Visit: Payer: Self-pay | Admitting: Family Medicine

## 2022-10-30 DIAGNOSIS — Z862 Personal history of diseases of the blood and blood-forming organs and certain disorders involving the immune mechanism: Secondary | ICD-10-CM

## 2022-11-17 ENCOUNTER — Encounter: Payer: PPO | Admitting: Family Medicine

## 2022-12-04 DIAGNOSIS — H5203 Hypermetropia, bilateral: Secondary | ICD-10-CM | POA: Diagnosis not present

## 2022-12-04 DIAGNOSIS — H35373 Puckering of macula, bilateral: Secondary | ICD-10-CM | POA: Diagnosis not present

## 2022-12-04 DIAGNOSIS — H2513 Age-related nuclear cataract, bilateral: Secondary | ICD-10-CM | POA: Diagnosis not present

## 2022-12-22 ENCOUNTER — Other Ambulatory Visit: Payer: Self-pay

## 2022-12-24 ENCOUNTER — Other Ambulatory Visit: Payer: Self-pay

## 2022-12-24 ENCOUNTER — Other Ambulatory Visit: Payer: Self-pay | Admitting: Gastroenterology

## 2022-12-25 ENCOUNTER — Other Ambulatory Visit: Payer: Self-pay

## 2022-12-25 MED FILL — Pantoprazole Sodium EC Tab 40 MG (Base Equiv): ORAL | 30 days supply | Qty: 30 | Fill #0 | Status: AC

## 2022-12-27 NOTE — Progress Notes (Unsigned)
Name: Dustin Cobb   MRN: [id number]    DOB: Jun 10, 1945   Date:12/28/2022       Progress Note  Subjective  Chief Complaint  Annual Exam  HPI  Patient presents for annual CPE.  IPSS Questionnaire (AUA-7): Over the past month.   1)  How often have you had a sensation of not emptying your bladder completely after you finish urinating?  1 - Less than 1 time in 5  2)  How often have you had to urinate again less than two hours after you finished urinating? 1 - Less than 1 time in 5  3)  How often have you found you stopped and started again several times when you urinated?  0 - Not at all  4) How difficult have you found it to postpone urination?  0 - Not at all  5) How often have you had a weak urinary stream?  0 - Not at all  6) How often have you had to push or strain to begin urination?  1 - Less than 1 time in 5  7) How many times did you most typically get up to urinate from the time you went to bed until the time you got up in the morning?  0 - None  Total score:  0-7 mildly symptomatic   8-19 moderately symptomatic   20-35 severely symptomatic     Diet: eats at home most of the time, he still eats fried food but not often  Exercise: continue regular physical activity , he has a garden, cuts his own grass also maintenance at his house  Last Dental Exam: up to date  Last Eye Exam: up to date   Depression: phq 9 is negative    12/28/2022    8:06 AM 08/24/2022    8:52 AM 02/24/2022    9:46 AM 08/23/2021    1:46 PM 08/23/2021    1:38 PM  Depression screen PHQ 2/9  Decreased Interest 0 0 0 0 0  Down, Depressed, Hopeless 0 1 0 0 0  PHQ - 2 Score 0 1 0 0 0  Altered sleeping 1 3 0 0 0  Tired, decreased energy 1 2 0 0 0  Change in appetite 0 0 0 0 0  Feeling bad or failure about yourself  0 0 0 0 0  Trouble concentrating 0 0 0 0 0  Moving slowly or fidgety/restless 0 0 0 0 0  Suicidal thoughts 0 0 0 0 0  PHQ-9 Score 2 6 0 0 0  Difficult doing work/chores Not difficult at all  Not difficult at all       Hypertension:  BP Readings from Last 3 Encounters:  12/28/22 120/80  10/09/22 122/81  08/25/22 118/72    Obesity: Wt Readings from Last 3 Encounters:  12/28/22 203 lb 1.6 oz (92.1 kg)  10/09/22 205 lb (93 kg)  08/25/22 202 lb (91.6 kg)   BMI Readings from Last 3 Encounters:  12/28/22 30.88 kg/m  10/09/22 31.17 kg/m  08/25/22 30.71 kg/m     Lipids:  Lab Results  Component Value Date   CHOL 146 02/24/2022   CHOL 138 02/23/2021   CHOL 138 02/18/2020   Lab Results  Component Value Date   HDL 62 02/24/2022   HDL 60 02/23/2021   HDL 59 02/18/2020   Lab Results  Component Value Date   LDLCALC 71 02/24/2022   LDLCALC 65 02/23/2021   LDLCALC 66 02/18/2020   Lab Results  Component  Value Date   TRIG 47 02/24/2022   TRIG 58 02/23/2021   TRIG 52 02/18/2020   Lab Results  Component Value Date   CHOLHDL 2.4 02/24/2022   CHOLHDL 2.3 02/23/2021   CHOLHDL 2.3 02/18/2020   No results found for: "LDLDIRECT" Glucose:  Glucose  Date Value Ref Range Status  08/29/2012 97 65 - 99 mg/dL Final  87/56/4332 94 65 - 99 mg/dL Final   Glucose, Bld  Date Value Ref Range Status  02/24/2022 85 65 - 99 mg/dL Final    Comment:    .            Fasting reference interval .   02/23/2021 86 65 - 99 mg/dL Final    Comment:    .            Fasting reference interval .   02/18/2020 84 65 - 99 mg/dL Final    Comment:    .            Fasting reference interval .     Flowsheet Row Clinical Support from 08/23/2021 in [facility]  AUDIT-C Score 0       Married STD testing and prevention (HIV/chl/gon/syphilis):  not interested  Sexual history: one partner, married , uses cialis for ED Hep C Screening: normal  Skin cancer: Discussed monitoring for atypical lesions Colorectal cancer: 2022 Prostate cancer:  not applicable Lab Results  Component Value Date   PSA Normal 10/17/2013     Lung cancer:  Low Dose CT  Chest recommended if Age 68-80 years, 30 pack-year currently smoking OR have quit w/in 15years. Patient  no a candidate for screening   AAA: The USPSTF recommends one-time screening with ultrasonography in men ages 6 to 75 years who have ever smoked. Patient   no, a candidate for screening  ECG:  08/16/2018  Vaccines:   RSV: discussed with patient  Tdap: up to date  Shingrix: discussed with patient  Pneumonia: up to date  Flu: yearly  COVID-19: he had two shots   Advanced Care Planning: A voluntary discussion about advance care planning including the explanation and discussion of advance directives.  Discussed health care proxy and Living will, and the patient was able to identify a health care proxy as wife .  Patient does not have a living will and power of attorney of health care   Patient Active Problem List   Diagnosis Date Noted   Gastritis without bleeding    Aortic aneurysm without rupture (HCC) 08/23/2021   Pituitary microadenoma (HCC) 08/23/2021   Abdominal aortic aneurysm (AAA) without rupture (HCC) 08/23/2021   OSA treated with BiPAP 07/11/2021   Encounter for BiPAP use counseling 07/11/2021   Encounter for screening colonoscopy    Lichen simplex chronicus 02/13/2018   Rash of hands 08/10/2016   Hypertension, essential, benign 01/07/2015   GERD (gastroesophageal reflux disease) 01/07/2015   OSA on CPAP 01/07/2015   Low TSH level 01/07/2015   Hyperglycemia 01/07/2015   Obesity (BMI 30.0-34.9) 01/07/2015   Dyslipidemia 01/07/2015   History of hemorrhoidectomy 01/07/2015   History of iron deficiency anemia 01/07/2015   Allergic rhinitis 01/07/2015   Metabolic syndrome 01/07/2015    Past Surgical History:  Procedure Laterality Date   CARDIAC CATHETERIZATION     COLONOSCOPY     COLONOSCOPY WITH PROPOFOL N/A 12/03/2020   Procedure: COLONOSCOPY WITH PROPOFOL;  Surgeon: Midge Minium, MD;  Location: [facility];  Service: Endoscopy;  Laterality: N/A;  ESOPHAGOGASTRODUODENOSCOPY (EGD) WITH PROPOFOL N/A 01/09/2022   Procedure: ESOPHAGOGASTRODUODENOSCOPY (EGD) WITH PROPOFOL;  Surgeon: Midge Minium, MD;  Location: [facility];  Service: Endoscopy;  Laterality: N/A;   HEMORRHOID BANDING  04/01/2012   Dr. Birdie Sons    Family History  Problem Relation Age of Onset   Cancer Mother        Thyroid   Emphysema Father    COPD Father    Hypertension Sister    Hypertension Brother    Heart disease Brother    Healthy Daughter     Social History   Socioeconomic History   Marital status: Married    Spouse name: Mary   Number of children: 2   Years of education: Not on file   Highest education level: Associate degree: academic program  Occupational History   Occupation: retired  Tobacco Use   Smoking status: Former    Current packs/day: 0.00    Average packs/day: 1 pack/day for 20.0 years (20.0 ttl pk-yrs)    Types: Cigarettes    Start date: 01/07/1975    Quit date: 01/07/1995    Years since quitting: 27.9   Smokeless tobacco: Never   Tobacco comments:    smoking cessation materials not required  Vaping Use   Vaping status: Never Used  Substance and Sexual Activity   Alcohol use: No   Drug use: No   Sexual activity: Yes    Partners: Female    Birth control/protection: None  Other Topics Concern   Not on file  Social History Narrative   Not on file   Social Determinants of Health   Financial Resource Strain: Low Risk  (12/28/2022)   Overall Financial Resource Strain (CARDIA)    Difficulty of Paying Living Expenses: Not hard at all  Food Insecurity: No Food Insecurity (12/28/2022)   Hunger Vital Sign    Worried About Running Out of Food in the Last Year: Never true    Ran Out of Food in the Last Year: Never true  Transportation Needs: No Transportation Needs (12/28/2022)   PRAPARE - Administrator, Civil Service (Medical): No    Lack of Transportation (Non-Medical): No  Physical Activity: Sufficiently Active  (12/28/2022)   Exercise Vital Sign    Days of Exercise per Week: 2 days    Minutes of Exercise per Session: 100 min  Recent Concern: Physical Activity - Inactive (12/28/2022)   Exercise Vital Sign    Days of Exercise per Week: 0 days    Minutes of Exercise per Session: 0 min  Stress: No Stress Concern Present (12/28/2022)   [employer] of Occupational Health - Occupational Stress Questionnaire    Feeling of Stress : Only a little  Social Connections: Moderately Integrated (12/28/2022)   Social Connection and Isolation Panel [NHANES]    Frequency of Communication with Friends and Family: Once a week    Frequency of Social Gatherings with Friends and Family: Once a week    Attends Religious Services: More than 4 times per year    Active Member of [employer] or Organizations: Yes    Attends Banker Meetings: More than 4 times per year    Marital Status: Married  Catering manager Violence: Not At Risk (12/28/2022)   Humiliation, Afraid, Rape, and Kick questionnaire    Fear of Current or Ex-Partner: No    Emotionally Abused: No    Physically Abused: No    Sexually Abused: No     Current Outpatient Medications:    augmented  betamethasone dipropionate (DIPROLENE-AF) 0.05 % cream, 1(one) application(s) topical 2(two) times a day, Disp: 50 g, Rfl: 1   azelastine (ASTELIN) 0.1 % nasal spray, Place 1 spray into both nostrils 2 (two) times daily. Use in each nostril as directed, Disp: 30 mL, Rfl: 12   Cholecalciferol (VITAMIN D3) 1000 UNITS CAPS, Take 1 capsule by mouth daily., Disp: , Rfl:    ferrous sulfate 325 (65 FE) MG tablet, Take 1 tablet by mouth daily., Disp: , Rfl:    fexofenadine (ALLEGRA) 180 MG tablet, Take 180 mg by mouth daily., Disp: , Rfl:    losartan (COZAAR) 50 MG tablet, Take 1 tablet (50 mg total) by mouth daily., Disp: 90 tablet, Rfl: 1   pantoprazole (PROTONIX) 40 MG tablet, Take 1 tablet (40 mg total) by mouth daily. MUST SCHEDULE OFFICE VISIT, Disp: 30 tablet, Rfl:  1   rosuvastatin (CRESTOR) 20 MG tablet, TAKE 1 TABLET BY MOUTH DAILY., Disp: 90 tablet, Rfl: 1   tadalafil (CIALIS) 10 MG tablet, Take 1 tablet (10 mg total) by mouth every other day as needed for erectile dysfunction., Disp: 10 tablet, Rfl: 1   temazepam (RESTORIL) 15 MG capsule, Take 1 capsule (15 mg total) by mouth at bedtime as needed for sleep., Disp: 90 capsule, Rfl: 1   fluticasone (FLONASE) 50 MCG/ACT nasal spray, Place 2 sprays into both nostrils daily. (Patient not taking: Reported on 08/24/2022), Disp: 16 g, Rfl: 5  No Known Allergies   ROS  Constitutional: Negative for fever or weight change.  Respiratory: Negative for cough and shortness of breath.   Cardiovascular: Negative for chest pain or palpitations.  Gastrointestinal: Negative for abdominal pain, no bowel changes.  Musculoskeletal: Negative for gait problem or joint swelling.  Skin: Negative for rash.  Neurological: Negative for dizziness or headache.  No other specific complaints in a complete review of systems (except as listed in HPI above).   Objective  Vitals:   12/28/22 0809  BP: 120/80  Pulse: 72  Resp: 14  Temp: 97.7 F (36.5 C)  TempSrc: Oral  SpO2: 94%  Weight: 203 lb 1.6 oz (92.1 kg)  Height: 5\' 8"  (1.727 m)    Body mass index is 30.88 kg/m.  Physical Exam  Constitutional: Patient appears well-developed and well-nourished. No distress.  HENT: Head: Normocephalic and atraumatic. Ears: B TMs ok, no erythema or effusion; Nose: Nose normal. Mouth/Throat: Oropharynx is clear and moist. No oropharyngeal exudate.  Eyes: Conjunctivae and EOM are normal. Pupils are equal, round, and reactive to light. No scleral icterus.  Neck: Normal range of motion. Neck supple. No JVD present. No thyromegaly present.  Cardiovascular: Normal rate, regular rhythm and normal heart sounds.  No murmur heard. No BLE edema. Pulmonary/Chest: Effort normal and breath sounds normal. No respiratory distress. Abdominal:  Soft. Bowel sounds are normal, no distension. There is no tenderness. no masses. Diathesis recti MALE GENITALIA: Normal descended testes bilaterally, no masses palpated, no hernias, no lesions, no discharge RECTAL: not done  Musculoskeletal: Normal range of motion, no joint effusions. No gross deformities Neurological: he is alert and oriented to person, place, and time. No cranial nerve deficit. Coordination, balance, strength, speech and gait are normal.  Skin: Skin is warm and dry. lichen and eczematous patches on both hands  Psychiatric: Patient has a normal mood and affect. behavior is normal. Judgment and thought content normal.   Fall Risk:    12/28/2022    8:06 AM 08/24/2022   11:47 AM 08/24/2022    8:52  AM 02/24/2022    9:46 AM 08/23/2021    1:48 PM  Fall Risk   Falls in the past year? 0 0 0 0 0  Number falls in past yr: 0    0  Injury with Fall? 0    0  Risk for fall due to : No Fall Risks  No Fall Risks No Fall Risks No Fall Risks  Follow up Falls prevention discussed;Education provided;Falls evaluation completed  Falls prevention discussed Falls prevention discussed;Education provided;Falls evaluation completed Falls prevention discussed     Functional Status Survey: Is the patient deaf or have difficulty hearing?: No Does the patient have difficulty seeing, even when wearing glasses/contacts?: No Does the patient have difficulty concentrating, remembering, or making decisions?: No Does the patient have difficulty walking or climbing stairs?: No Does the patient have difficulty dressing or bathing?: No Does the patient have difficulty doing errands alone such as visiting a doctor's office or shopping?: No    Assessment & Plan   1. Well adult exam  Discussed regular walks    -Prostate cancer screening and PSA options (with potential risks and benefits of testing vs not testing) were discussed along with recent recs/guidelines. -USPSTF grade A and B recommendations  reviewed with patient; age-appropriate recommendations, preventive care, screening tests, etc discussed and encouraged; healthy living encouraged; see AVS for patient education given to patient -Discussed importance of 150 minutes of physical activity weekly, eat two servings of fish weekly, eat one serving of tree nuts ( cashews, pistachios, pecans, almonds.Marland Kitchen) every other day, eat 6 servings of fruit/vegetables daily and drink plenty of water and avoid sweet beverages.  -Reviewed Health Maintenance: yes

## 2022-12-28 ENCOUNTER — Other Ambulatory Visit: Payer: Self-pay

## 2022-12-28 ENCOUNTER — Ambulatory Visit (INDEPENDENT_AMBULATORY_CARE_PROVIDER_SITE_OTHER): Payer: PPO | Admitting: Family Medicine

## 2022-12-28 ENCOUNTER — Encounter: Payer: Self-pay | Admitting: Family Medicine

## 2022-12-28 VITALS — BP 120/80 | HR 72 | Temp 97.7°F | Resp 14 | Ht 68.0 in | Wt 203.1 lb

## 2022-12-28 DIAGNOSIS — Z0000 Encounter for general adult medical examination without abnormal findings: Secondary | ICD-10-CM | POA: Diagnosis not present

## 2022-12-28 MED ORDER — AREXVY 120 MCG/0.5ML IM SUSR
INTRAMUSCULAR | 0 refills | Status: DC
  Filled 2022-12-28: qty 1, 1d supply, fill #0

## 2022-12-28 MED ORDER — ZOSTER VAC RECOMB ADJUVANTED 50 MCG/0.5ML IM SUSR
0.5000 mL | Freq: Once | INTRAMUSCULAR | 1 refills | Status: AC
  Filled 2022-12-28: qty 0.5, 1d supply, fill #0
  Filled 2023-08-17: qty 0.5, 1d supply, fill #1

## 2022-12-29 ENCOUNTER — Other Ambulatory Visit: Payer: Self-pay

## 2023-01-15 ENCOUNTER — Ambulatory Visit: Payer: PPO | Admitting: Internal Medicine

## 2023-01-15 NOTE — Progress Notes (Signed)
Pt's appointment was canceled due to his compliance level not being sufficient for a new machine.

## 2023-01-30 ENCOUNTER — Ambulatory Visit: Payer: PPO | Admitting: Gastroenterology

## 2023-01-30 ENCOUNTER — Other Ambulatory Visit: Payer: Self-pay

## 2023-01-30 ENCOUNTER — Encounter: Payer: Self-pay | Admitting: Gastroenterology

## 2023-01-30 VITALS — BP 129/85 | HR 80 | Temp 98.6°F | Wt 205.0 lb

## 2023-01-30 DIAGNOSIS — K219 Gastro-esophageal reflux disease without esophagitis: Secondary | ICD-10-CM

## 2023-01-30 MED ORDER — PANTOPRAZOLE SODIUM 40 MG PO TBEC
40.0000 mg | DELAYED_RELEASE_TABLET | Freq: Every day | ORAL | 3 refills | Status: DC
  Filled 2023-01-30 (×2): qty 90, 90d supply, fill #0
  Filled 2023-04-25: qty 90, 90d supply, fill #1
  Filled 2023-07-24: qty 90, 90d supply, fill #2

## 2023-01-30 NOTE — Progress Notes (Signed)
Primary Care Physician: Alba Cory, MD  Primary Gastroenterologist:  Dr. Midge Minium  Chief Complaint  Patient presents with   Follow-up   Gastroesophageal Reflux    Pt reports he may be having issues taking in the morning and started taking 20-30 mins prior to other meds and he is still having intermittent issues.    HPI: Dustin Cobb is a 77 y.o. male here with a history of GERD.  The patient had seen me and had an upper endoscopy back in August 2023 with biopsy showing chronic gastritis. The patient reports he has been doing well except that he has to take some Rolaids every so often when he has acid breakthrough.  The patient denies any black stools bloody stools nausea vomiting fevers or chills.  He also states that the reason he is here today is because he needs a refill of his medication and would like to switch to a 75-month prescription at a time.  Past Medical History:  Diagnosis Date   Aching headache    Allergy    ED (erectile dysfunction)    GERD (gastroesophageal reflux disease)    Hyperlipidemia    Hypertension    Insomnia    Metabolic syndrome    Onychomycosis    OSA on CPAP    Postural dizziness    Rash    Sleep apnea    Wears partial dentures     Current Outpatient Medications  Medication Sig Dispense Refill   augmented betamethasone dipropionate (DIPROLENE-AF) 0.05 % cream 1(one) application(s) topical 2(two) times a day 50 g 1   azelastine (ASTELIN) 0.1 % nasal spray Place 1 spray into both nostrils 2 (two) times daily. Use in each nostril as directed 30 mL 12   Cholecalciferol (VITAMIN D3) 1000 UNITS CAPS Take 1 capsule by mouth daily.     ferrous sulfate 325 (65 FE) MG tablet Take 1 tablet by mouth daily.     fexofenadine (ALLEGRA) 180 MG tablet Take 180 mg by mouth daily.     fluticasone (FLONASE) 50 MCG/ACT nasal spray Place 2 sprays into both nostrils daily. 16 g 5   losartan (COZAAR) 50 MG tablet Take 1 tablet (50 mg total) by mouth  daily. 90 tablet 1   pantoprazole (PROTONIX) 40 MG tablet Take 1 tablet (40 mg total) by mouth daily. MUST SCHEDULE OFFICE VISIT 30 tablet 1   rosuvastatin (CRESTOR) 20 MG tablet TAKE 1 TABLET BY MOUTH DAILY. 90 tablet 1   RSV vaccine recomb adjuvanted (AREXVY) 120 MCG/0.5ML injection Inject into the muscle. 1 each 0   tadalafil (CIALIS) 10 MG tablet Take 1 tablet (10 mg total) by mouth every other day as needed for erectile dysfunction. 10 tablet 1   temazepam (RESTORIL) 15 MG capsule Take 1 capsule (15 mg total) by mouth at bedtime as needed for sleep. 90 capsule 1   No current facility-administered medications for this visit.    Allergies as of 01/30/2023   (No Known Allergies)    ROS:  General: Negative for anorexia, weight loss, fever, chills, fatigue, weakness. ENT: Negative for hoarseness, difficulty swallowing , nasal congestion. CV: Negative for chest pain, angina, palpitations, dyspnea on exertion, peripheral edema.  Respiratory: Negative for dyspnea at rest, dyspnea on exertion, cough, sputum, wheezing.  GI: See history of present illness. GU:  Negative for dysuria, hematuria, urinary incontinence, urinary frequency, nocturnal urination.  Endo: Negative for unusual weight change.    Physical Examination:   BP 129/85 (BP Location: Left  Arm, Patient Position: Sitting, Cuff Size: Normal)   Pulse 80   Temp 98.6 F (37 C) (Oral)   Wt 205 lb (93 kg)   BMI 31.17 kg/m   General: Well-nourished, well-developed in no acute distress.  Eyes: No icterus. Conjunctivae pink. Neuro: Alert and oriented x 3.  Grossly intact. Psych: Alert and cooperative, normal mood and affect.  Labs:    Imaging Studies: No results found.  Assessment and Plan:   Dustin Cobb is a 77 y.o. y/o male who comes in today with a history of GERD.  The patient has been moderately well-controlled with some acid breakthrough that he takes Rolaids for.  The patient has been told that if he continues  to have episodes of acid breakthrough he should take the Protonix twice a day during those times.  The patient has been explained the plan and agrees with it.     Midge Minium, MD. Clementeen Graham    Note: This dictation was prepared with Dragon dictation along with smaller phrase technology. Any transcriptional errors that result from this process are unintentional.

## 2023-02-14 NOTE — Progress Notes (Signed)
Name: Dustin Cobb   MRN: [id number]    DOB: [birth year]   Date:02/15/2023       Progress Note  Subjective  Chief Complaint  Follow Up  HPI  Hyperlipidemia: taking Rosuvastatin now, no side effects, no chest pain or myalgias, last LDL went up a little to 71, but still at goal , we will recheck labs today   HTN: he has been taking losartan 50 mg and tolerating it well, bp today towards low end of normal but at home around 120's/80's, denies dizziness . No headaches or chest pain   Abdominal aorta aneurysm: found on screen [place] in 2020 , size was 3.1 cm and repeat study done 02/2022 was stable size was 3.2 cm and he is due for follow up with Fall. Taking statins , cannot tolerate aspirin   Hyperglycemia:  hgbA1C has been stable  last level was up from 5.7 % to 5.9 % No polyphagia, polydipsia or polyuria He has been cutting down on sweets    Low TSH: seen by Dr. Tedd Sias in the past , TSH has been slightly suppressed but stable. We will recheck level today    GERD: he had a repeat Endoscopy and showed chronic gastritis with some active disease and he is now taking Pantoprazole and symptoms are still present , he recently saw Dr.Wohl and advised to double dose of PPI if needed.   OSA: he has been wearing BIpap most nights, missed appointment with Dr. Belia Heman but already re-scheduled it for Nov  He wakes up feeling rested most days. He states no leakage , pressure seems okay for him , he states sometimes has restless sleep, melatonin did not help and he stopped He wakes up feeling tired, we sent rx for Belsomra but not covered by insurance so we changed to Temazepam, he is sleeping better no longer waking up during the night   Obesity: discussed trying to go down to below 200 lbs. He is more aware of portion sizes, he is eating more vegetables, now cutting down on desserts and weight is down a couple of pounds again   AR: on nasal steroid and claritin, still has intermittent symptoms. Worse during  Fall and Spring  Stable   ED: he states he tried viagra years ago and it caused headache, he would like to try something else. He states does not have intercourse often , he takes Cialis prn and works well   Sellar Mass: MRI ordered by ENT for evaluation of hearing loss, incidental finding of pituitary adenoma, seen by Dr. Adriana Simas and per patient he said to repeat in 2025 since stable findings.  He denies double vision, balance problems or weakness   Repeat in April 2023   IMPRESSION: 1. Equivocal 3 mm hypoenhancing focus only seen on coronal postcontrast images within the pituitary gland at midline, may represent a pituitary macro adenoma, Rathke's cleft cyst versus artifact. 2. Mild chronic microvascular ischemic changes of the white matter.  Patient Active Problem List   Diagnosis Date Noted   Gastritis without bleeding    Aortic aneurysm without rupture (HCC) 08/23/2021   Pituitary microadenoma (HCC) 08/23/2021   Abdominal aortic aneurysm (AAA) without rupture (HCC) 08/23/2021   OSA treated with BiPAP 07/11/2021   Encounter for BiPAP use counseling 07/11/2021   Encounter for screening colonoscopy    Lichen simplex chronicus 02/13/2018   Rash of hands 08/10/2016   Hypertension, essential, benign 01/07/2015   GERD (gastroesophageal reflux disease) 01/07/2015   OSA on CPAP  01/07/2015   Low TSH level 01/07/2015   Hyperglycemia 01/07/2015   Obesity (BMI 30.0-34.9) 01/07/2015   Dyslipidemia 01/07/2015   History of hemorrhoidectomy 01/07/2015   History of iron deficiency anemia 01/07/2015   Allergic rhinitis 01/07/2015   Metabolic syndrome 01/07/2015    Past Surgical History:  Procedure Laterality Date   CARDIAC CATHETERIZATION     COLONOSCOPY     COLONOSCOPY WITH PROPOFOL N/A 12/03/2020   Procedure: COLONOSCOPY WITH PROPOFOL;  Surgeon: Midge Minium, MD;  Location: [facility];  Service: Endoscopy;  Laterality: N/A;   ESOPHAGOGASTRODUODENOSCOPY (EGD) WITH PROPOFOL N/A  01/09/2022   Procedure: ESOPHAGOGASTRODUODENOSCOPY (EGD) WITH PROPOFOL;  Surgeon: Midge Minium, MD;  Location: [facility];  Service: Endoscopy;  Laterality: N/A;   HEMORRHOID BANDING  04/01/2012   Dr. Birdie Sons    Family History  Problem Relation Age of Onset   Cancer Mother        Thyroid   Emphysema Father    COPD Father    Hypertension Sister    Hypertension Brother    Heart disease Brother    Healthy Daughter     Social History   Tobacco Use   Smoking status: Former    Current packs/day: 0.00    Average packs/day: 1 pack/day for 20.0 years (20.0 ttl pk-yrs)    Types: Cigarettes    Start date: 01/07/1975    Quit date: 01/07/1995    Years since quitting: 28.1   Smokeless tobacco: Never   Tobacco comments:    smoking cessation materials not required  Substance Use Topics   Alcohol use: No     Current Outpatient Medications:    augmented betamethasone dipropionate (DIPROLENE-AF) 0.05 % cream, 1(one) application(s) topical 2(two) times a day, Disp: 50 g, Rfl: 1   azelastine (ASTELIN) 0.1 % nasal spray, Place 1 spray into both nostrils 2 (two) times daily. Use in each nostril as directed, Disp: 30 mL, Rfl: 12   Cholecalciferol (VITAMIN D3) 1000 UNITS CAPS, Take 1 capsule by mouth daily., Disp: , Rfl:    ferrous sulfate 325 (65 FE) MG tablet, Take 1 tablet by mouth daily., Disp: , Rfl:    fexofenadine (ALLEGRA) 180 MG tablet, Take 180 mg by mouth daily., Disp: , Rfl:    fluticasone (FLONASE) 50 MCG/ACT nasal spray, Place 2 sprays into both nostrils daily., Disp: 16 g, Rfl: 5   pantoprazole (PROTONIX) 40 MG tablet, Take 1 tablet (40 mg total) by mouth daily., Disp: 90 tablet, Rfl: 3   tadalafil (CIALIS) 10 MG tablet, Take 1 tablet (10 mg total) by mouth every other day as needed for erectile dysfunction., Disp: 10 tablet, Rfl: 1   losartan (COZAAR) 50 MG tablet, Take 1 tablet (50 mg total) by mouth daily., Disp: 90 tablet, Rfl: 1   rosuvastatin (CRESTOR) 20 MG tablet, Take 1  tablet (20 mg total) by mouth daily., Disp: 90 tablet, Rfl: 1   temazepam (RESTORIL) 15 MG capsule, Take 1 capsule (15 mg total) by mouth at bedtime as needed for sleep., Disp: 90 capsule, Rfl: 0  No Known Allergies  I personally reviewed active problem list, medication list, allergies, family history, social history, health maintenance with the patient/caregiver today.   ROS  Ten systems reviewed and is negative except as mentioned in HPI    Objective  Vitals:   02/15/23 0802  BP: 122/80  Pulse: 68  Resp: 18  Temp: 97.8 F (36.6 C)  TempSrc: Oral  SpO2: 94%  Weight: 203 lb 11.2 oz (92.4  kg)  Height: 5\' 8"  (1.727 m)    Body mass index is 30.97 kg/m.  Physical Exam  Constitutional: Patient appears well-developed and well-nourished. Obese  No distress.  HEENT: head atraumatic, normocephalic, pupils equal and reactive to light, neck supple Cardiovascular: Normal rate, regular rhythm and normal heart sounds.  No murmur heard. No BLE edema. Pulmonary/Chest: Effort normal and breath sounds normal. No respiratory distress. Abdominal: Soft.  There is no tenderness. Psychiatric: Patient has a normal mood and affect. behavior is normal. Judgment and thought content normal.   PHQ2/9:    12/28/2022    8:06 AM 08/24/2022    8:52 AM 02/24/2022    9:46 AM 08/23/2021    1:46 PM 08/23/2021    1:38 PM  Depression screen PHQ 2/9  Decreased Interest 0 0 0 0 0  Down, Depressed, Hopeless 0 1 0 0 0  PHQ - 2 Score 0 1 0 0 0  Altered sleeping 1 3 0 0 0  Tired, decreased energy 1 2 0 0 0  Change in appetite 0 0 0 0 0  Feeling bad or failure about yourself  0 0 0 0 0  Trouble concentrating 0 0 0 0 0  Moving slowly or fidgety/restless 0 0 0 0 0  Suicidal thoughts 0 0 0 0 0  PHQ-9 Score 2 6 0 0 0  Difficult doing work/chores Not difficult at all Not difficult at all       phq 9 is negative   Fall Risk:    02/15/2023    8:03 AM 12/28/2022    8:06 AM 08/24/2022   11:47 AM 08/24/2022     8:52 AM 02/24/2022    9:46 AM  Fall Risk   Falls in the past year? 0 0 0 0 0  Number falls in past yr:  0     Injury with Fall?  0     Risk for fall due to : No Fall Risks No Fall Risks  No Fall Risks No Fall Risks  Follow up Falls prevention discussed Falls prevention discussed;Education provided;Falls evaluation completed  Falls prevention discussed Falls prevention discussed;Education provided;Falls evaluation completed      Functional Status Survey: Is the patient deaf or have difficulty hearing?: No Does the patient have difficulty seeing, even when wearing glasses/contacts?: No Does the patient have difficulty concentrating, remembering, or making decisions?: No Does the patient have difficulty walking or climbing stairs?: No Does the patient have difficulty dressing or bathing?: No Does the patient have difficulty doing errands alone such as visiting a doctor's office or shopping?: No    Assessment & Plan  1. Pituitary microadenoma (HCC)  - Prolactin  2. Infrarenal abdominal aortic aneurysm (AAA) without rupture (HCC)  - Lipid panel  3. Hypertension, essential, benign  - losartan (COZAAR) 50 MG tablet; Take 1 tablet (50 mg total) by mouth daily.  Dispense: 90 tablet; Refill: 1 - CBC with Differential/Platelet - COMPLETE METABOLIC PANEL WITH GFR  4. Vitamin D deficiency  Recheck level  5. Dyslipidemia  - rosuvastatin (CRESTOR) 20 MG tablet; Take 1 tablet (20 mg total) by mouth daily.  Dispense: 90 tablet; Refill: 1  6. Primary insomnia  - temazepam (RESTORIL) 15 MG capsule; Take 1 capsule (15 mg total) by mouth at bedtime as needed for sleep.  Dispense: 90 capsule; Refill: 0  7. Need for immunization against influenza  - Flu vaccine trivalent PF, 6mos and older(Flulaval,Afluria,Fluarix,Fluzone)  8. Gastroesophageal reflux disease without esophagitis  Reviewed note from  GI with patient   9. OSA treated with BiPAP  He has a visit scheduled with Dr.  Belia Heman  10. Subclinical hyperthyroidism  - TSH  11. Hyperglycemia  - Hemoglobin A1c

## 2023-02-15 ENCOUNTER — Other Ambulatory Visit: Payer: Self-pay

## 2023-02-15 ENCOUNTER — Ambulatory Visit (INDEPENDENT_AMBULATORY_CARE_PROVIDER_SITE_OTHER): Payer: PPO | Admitting: Family Medicine

## 2023-02-15 ENCOUNTER — Encounter: Payer: Self-pay | Admitting: Family Medicine

## 2023-02-15 VITALS — BP 122/80 | HR 68 | Temp 97.8°F | Resp 18 | Ht 68.0 in | Wt 203.7 lb

## 2023-02-15 DIAGNOSIS — K219 Gastro-esophageal reflux disease without esophagitis: Secondary | ICD-10-CM | POA: Diagnosis not present

## 2023-02-15 DIAGNOSIS — I7143 Infrarenal abdominal aortic aneurysm, without rupture: Secondary | ICD-10-CM | POA: Diagnosis not present

## 2023-02-15 DIAGNOSIS — E785 Hyperlipidemia, unspecified: Secondary | ICD-10-CM

## 2023-02-15 DIAGNOSIS — E559 Vitamin D deficiency, unspecified: Secondary | ICD-10-CM

## 2023-02-15 DIAGNOSIS — D352 Benign neoplasm of pituitary gland: Secondary | ICD-10-CM | POA: Diagnosis not present

## 2023-02-15 DIAGNOSIS — I10 Essential (primary) hypertension: Secondary | ICD-10-CM

## 2023-02-15 DIAGNOSIS — G4733 Obstructive sleep apnea (adult) (pediatric): Secondary | ICD-10-CM

## 2023-02-15 DIAGNOSIS — F5101 Primary insomnia: Secondary | ICD-10-CM | POA: Diagnosis not present

## 2023-02-15 DIAGNOSIS — E0590 Thyrotoxicosis, unspecified without thyrotoxic crisis or storm: Secondary | ICD-10-CM | POA: Diagnosis not present

## 2023-02-15 DIAGNOSIS — R739 Hyperglycemia, unspecified: Secondary | ICD-10-CM

## 2023-02-15 DIAGNOSIS — Z23 Encounter for immunization: Secondary | ICD-10-CM | POA: Diagnosis not present

## 2023-02-15 MED ORDER — LOSARTAN POTASSIUM 50 MG PO TABS
50.0000 mg | ORAL_TABLET | Freq: Every day | ORAL | 1 refills | Status: DC
  Filled 2023-02-15 – 2023-04-01 (×2): qty 90, 90d supply, fill #0
  Filled 2023-06-28: qty 90, 90d supply, fill #1

## 2023-02-15 MED ORDER — ROSUVASTATIN CALCIUM 20 MG PO TABS
20.0000 mg | ORAL_TABLET | Freq: Every day | ORAL | 1 refills | Status: DC
  Filled 2023-02-15: qty 90, fill #0
  Filled 2023-02-19 – 2023-04-01 (×3): qty 90, 90d supply, fill #0
  Filled 2023-06-28: qty 90, 90d supply, fill #1

## 2023-02-15 MED ORDER — TEMAZEPAM 15 MG PO CAPS
15.0000 mg | ORAL_CAPSULE | Freq: Every evening | ORAL | 0 refills | Status: DC | PRN
Start: 2023-02-15 — End: 2023-08-17
  Filled 2023-02-15 – 2023-05-15 (×2): qty 90, 90d supply, fill #0

## 2023-02-15 NOTE — Patient Instructions (Addendum)
Dr. Adriana Simas: [phone]

## 2023-02-16 ENCOUNTER — Ambulatory Visit: Payer: PPO | Admitting: Family Medicine

## 2023-02-16 LAB — TSH: TSH: 0.48 mIU/L (ref 0.40–4.50)

## 2023-02-16 LAB — COMPLETE METABOLIC PANEL WITH GFR
AG Ratio: 1.4 (calc) (ref 1.0–2.5)
ALT: 18 U/L (ref 9–46)
AST: 24 U/L (ref 10–35)
Albumin: 4.1 g/dL (ref 3.6–5.1)
Alkaline phosphatase (APISO): 81 U/L (ref 35–144)
BUN: 15 mg/dL (ref 7–25)
CO2: 27 mmol/L (ref 20–32)
Calcium: 9.9 mg/dL (ref 8.6–10.3)
Chloride: 105 mmol/L (ref 98–110)
Creat: 1.19 mg/dL (ref 0.70–1.28)
Globulin: 2.9 g/dL (calc) (ref 1.9–3.7)
Glucose, Bld: 94 mg/dL (ref 65–99)
Potassium: 4.6 mmol/L (ref 3.5–5.3)
Sodium: 137 mmol/L (ref 135–146)
Total Bilirubin: 0.6 mg/dL (ref 0.2–1.2)
Total Protein: 7 g/dL (ref 6.1–8.1)
eGFR: 63 mL/min/{1.73_m2} (ref >=60)

## 2023-02-16 LAB — PROLACTIN: Prolactin: 4.2 ng/mL (ref 2.0–18.0)

## 2023-02-16 LAB — CBC WITH DIFFERENTIAL/PLATELET
Absolute Monocytes: 611 cells/uL (ref 200–950)
Basophils Absolute: 39 cells/uL (ref 0–200)
Basophils Relative: 0.7 %
Eosinophils Absolute: 220 cells/uL (ref 15–500)
Eosinophils Relative: 4 %
HCT: 45.4 % (ref 38.5–50.0)
Hemoglobin: 14.8 g/dL (ref 13.2–17.1)
Lymphs Abs: 2008 cells/uL (ref 850–3900)
MCH: 28.5 pg (ref 27.0–33.0)
MCHC: 32.6 g/dL (ref 32.0–36.0)
MCV: 87.3 fL (ref 80.0–100.0)
MPV: 9.4 fL (ref 7.5–12.5)
Monocytes Relative: 11.1 %
Neutro Abs: 2624 cells/uL (ref 1500–7800)
Neutrophils Relative %: 47.7 %
Platelets: 202 10*3/uL (ref 140–400)
RBC: 5.2 10*6/uL (ref 4.20–5.80)
RDW: 14.3 % (ref 11.0–15.0)
Total Lymphocyte: 36.5 %
WBC: 5.5 10*3/uL (ref 3.8–10.8)

## 2023-02-16 LAB — HEMOGLOBIN A1C
Hgb A1c MFr Bld: 6.1 % of total Hgb — ABNORMAL HIGH (ref <5.7)
Mean Plasma Glucose: 128 mg/dL
eAG (mmol/L): 7.1 mmol/L

## 2023-02-16 LAB — VITAMIN D 25 HYDROXY (VIT D DEFICIENCY, FRACTURES): Vit D, 25-Hydroxy: 51 ng/mL (ref 30–100)

## 2023-02-16 LAB — LIPID PANEL
Cholesterol: 152 mg/dL (ref <200)
HDL: 68 mg/dL (ref >=40)
LDL Cholesterol (Calc): 71 mg/dL (calc)
Non-HDL Cholesterol (Calc): 84 mg/dL (calc) (ref <130)
Total CHOL/HDL Ratio: 2.2 (calc) (ref <5.0)
Triglycerides: 58 mg/dL (ref <150)

## 2023-02-19 ENCOUNTER — Other Ambulatory Visit: Payer: Self-pay

## 2023-02-19 DIAGNOSIS — L280 Lichen simplex chronicus: Secondary | ICD-10-CM | POA: Diagnosis not present

## 2023-02-19 MED ORDER — BETAMETHASONE DIPROPIONATE AUG 0.05 % EX CREA
1.0000 | TOPICAL_CREAM | Freq: Two times a day (BID) | CUTANEOUS | 1 refills | Status: AC
  Filled 2023-02-19: qty 50, 50d supply, fill #0
  Filled 2023-06-28: qty 50, 50d supply, fill #1

## 2023-02-20 ENCOUNTER — Other Ambulatory Visit: Payer: Self-pay

## 2023-02-21 ENCOUNTER — Other Ambulatory Visit: Payer: Self-pay

## 2023-03-08 ENCOUNTER — Ambulatory Visit (INDEPENDENT_AMBULATORY_CARE_PROVIDER_SITE_OTHER): Payer: PPO | Admitting: Nurse Practitioner

## 2023-03-08 ENCOUNTER — Encounter (INDEPENDENT_AMBULATORY_CARE_PROVIDER_SITE_OTHER): Payer: Self-pay | Admitting: Nurse Practitioner

## 2023-03-08 ENCOUNTER — Ambulatory Visit (INDEPENDENT_AMBULATORY_CARE_PROVIDER_SITE_OTHER): Payer: PPO

## 2023-03-08 VITALS — BP 109/73 | HR 60 | Resp 18 | Ht 68.0 in | Wt 203.8 lb

## 2023-03-08 DIAGNOSIS — I714 Abdominal aortic aneurysm, without rupture, unspecified: Secondary | ICD-10-CM

## 2023-03-08 DIAGNOSIS — I10 Essential (primary) hypertension: Secondary | ICD-10-CM

## 2023-03-08 DIAGNOSIS — E785 Hyperlipidemia, unspecified: Secondary | ICD-10-CM

## 2023-03-12 NOTE — Progress Notes (Signed)
Subjective:    Patient ID: Dustin Cobb, male    DOB: [birth year], 77 y.o.   MRN: [id number] Chief Complaint  Patient presents with   Follow-up    f/u in 1 year with AAA -    The patient returns to the office for surveillance of a known ectatic abdominal aortic aneurysm. Patient denies new onset of abdominal pain or unusual back pain, no other abdominal complaints. No changes suggesting embolic episodes.   There have been no significant interval changes in the patient's overall health care since his last visit.  Patient denies amaurosis fugax or TIA symptoms. There is no history of claudication or rest pain symptoms of the lower extremities. The patient denies angina or shortness of breath.   Duplex US of the aorta and iliac arteries shows an AAA measured 3.12 cm.  Bilateral iliac arteries measure approximately 1.5 cm.  Previous diameter measurement of the abdominal aortic aneurysm was 3.1 cm.    Review of Systems  Gastrointestinal:  Negative for abdominal pain.  All other systems reviewed and are negative.      Objective:   Physical Exam Vitals reviewed.  HENT:     Head: Normocephalic.  Cardiovascular:     Rate and Rhythm: Normal rate.  Pulmonary:     Effort: Pulmonary effort is normal.  Skin:    General: Skin is warm and dry.  Neurological:     Mental Status: He is alert and oriented to person, place, and time.  Psychiatric:        Mood and Affect: Mood normal.        Behavior: Behavior normal.        Thought Content: Thought content normal.        Judgment: Judgment normal.     BP 109/73 (BP Location: Left Arm)   Pulse 60   Resp 18   Ht 5\' 8"  (1.727 m)   Wt 203 lb 12.8 oz (92.4 kg)   BMI 30.99 kg/m   Past Medical History:  Diagnosis Date   Aching headache    Allergy    ED (erectile dysfunction)    GERD (gastroesophageal reflux disease)    Hyperlipidemia    Hypertension    Insomnia    Metabolic syndrome    Onychomycosis    OSA on CPAP     Postural dizziness    Rash    Sleep apnea    Wears partial dentures     Social History   Socioeconomic History   Marital status: Married    Spouse name: Mary   Number of children: 2   Years of education: Not on file   Highest education level: Associate degree: academic program  Occupational History   Occupation: retired  Tobacco Use   Smoking status: Former    Current packs/day: 0.00    Average packs/day: 1 pack/day for 20.0 years (20.0 ttl pk-yrs)    Types: Cigarettes    Start date: 01/07/1975    Quit date: 01/07/1995    Years since quitting: 28.1   Smokeless tobacco: Never   Tobacco comments:    smoking cessation materials not required  Vaping Use   Vaping status: Never Used  Substance and Sexual Activity   Alcohol use: No   Drug use: No   Sexual activity: Yes    Partners: Female    Birth control/protection: None  Other Topics Concern   Not on file  Social History Narrative   Not on file   Social Determinants of  Health   Financial Resource Strain: Low Risk  (12/28/2022)   Overall Financial Resource Strain (CARDIA)    Difficulty of Paying Living Expenses: Not hard at all  Food Insecurity: No Food Insecurity (12/28/2022)   Hunger Vital Sign    Worried About Running Out of Food in the Last Year: Never true    Ran Out of Food in the Last Year: Never true  Transportation Needs: No Transportation Needs (12/28/2022)   PRAPARE - Administrator, Civil Service (Medical): No    Lack of Transportation (Non-Medical): No  Physical Activity: Sufficiently Active (12/28/2022)   Exercise Vital Sign    Days of Exercise per Week: 2 days    Minutes of Exercise per Session: 100 min  Recent Concern: Physical Activity - Inactive (12/28/2022)   Exercise Vital Sign    Days of Exercise per Week: 0 days    Minutes of Exercise per Session: 0 min  Stress: No Stress Concern Present (12/28/2022)   [employer] of Occupational Health - Occupational Stress Questionnaire    Feeling of  Stress : Only a little  Social Connections: Moderately Integrated (12/28/2022)   Social Connection and Isolation Panel [NHANES]    Frequency of Communication with Friends and Family: Once a week    Frequency of Social Gatherings with Friends and Family: Once a week    Attends Religious Services: More than 4 times per year    Active Member of [employer] or Organizations: Yes    Attends Banker Meetings: More than 4 times per year    Marital Status: Married  Catering manager Violence: Not At Risk (12/28/2022)   Humiliation, Afraid, Rape, and Kick questionnaire    Fear of Current or Ex-Partner: No    Emotionally Abused: No    Physically Abused: No    Sexually Abused: No    Past Surgical History:  Procedure Laterality Date   CARDIAC CATHETERIZATION     COLONOSCOPY     COLONOSCOPY WITH PROPOFOL N/A 12/03/2020   Procedure: COLONOSCOPY WITH PROPOFOL;  Surgeon: Midge Minium, MD;  Location: [facility];  Service: Endoscopy;  Laterality: N/A;   ESOPHAGOGASTRODUODENOSCOPY (EGD) WITH PROPOFOL N/A 01/09/2022   Procedure: ESOPHAGOGASTRODUODENOSCOPY (EGD) WITH PROPOFOL;  Surgeon: Midge Minium, MD;  Location: [facility] END[facility];  Service: Endoscopy;  Laterality: N/A;   HEMORRHOID BANDING  04/01/2012   Dr. Birdie Sons    Family History  Problem Relation Age of Onset   Cancer Mother        Thyroid   Emphysema Father    COPD Father    Hypertension Sister    Hypertension Brother    Heart disease Brother    Healthy Daughter     No Known Allergies     Latest Ref Rng & Units 02/15/2023    9:01 AM 02/24/2022   10:21 AM 02/23/2021    9:09 AM  CBC  WBC 3.8 - 10.8 Thousand/uL 5.5  6.1  5.2   Hemoglobin 13.2 - 17.1 g/dL 01.0  27.2  53.6   Hematocrit 38.5 - 50.0 % 45.4  43.6  43.8   Platelets 140 - 400 Thousand/uL 202  194  197       CMP     Component Value Date/Time   NA 137 02/15/2023 0901   NA 142 01/11/2015 0905   NA 137 08/29/2012 0830   K 4.6 02/15/2023 0901   K 4.2 08/29/2012  0830   CL 105 02/15/2023 0901   CL 105 08/29/2012 0830  CO2 27 02/15/2023 0901   CO2 28 08/29/2012 0830   GLUCOSE 94 02/15/2023 0901   GLUCOSE 97 08/29/2012 0830   BUN 15 02/15/2023 0901   BUN 23 01/11/2015 0905   BUN 24 (H) 08/29/2012 0830   CREATININE 1.19 02/15/2023 0901   CALCIUM 9.9 02/15/2023 0901   CALCIUM 9.1 08/29/2012 0830   PROT 7.0 02/15/2023 0901   PROT 6.8 01/11/2015 0905   PROT 7.4 08/29/2012 0830   ALBUMIN 3.8 01/19/2016 1018   ALBUMIN 4.2 01/11/2015 0905   ALBUMIN 3.9 08/29/2012 0830   AST 24 02/15/2023 0901   AST 24 08/29/2012 0830   ALT 18 02/15/2023 0901   ALT 32 08/29/2012 0830   ALKPHOS 88 01/19/2016 1018   ALKPHOS 111 08/29/2012 0830   BILITOT 0.6 02/15/2023 0901   BILITOT 0.7 01/11/2015 0905   BILITOT 0.4 08/29/2012 0830   EGFR 63 02/15/2023 0901   GFRNONAA 77 02/18/2020 0846     No results found.     Assessment & Plan:   1. Abdominal aortic aneurysm (AAA) without rupture, unspecified part (HCC) Recommend: No surgery or intervention is indicated at this time.  The patient has an asymptomatic abdominal aortic aneurysm that is less than 4 cm in maximal diameter.    I have reviewed the natural history of abdominal aortic aneurysm and the small risk of rupture for aneurysm less than 5 cm in size.  However, as these small aneurysms tend to enlarge over time, continued surveillance with ultrasound or CT scan is mandatory.   I have also discussed optimizing medical management with hypertension and lipid control and the negative effect that any tobacco products have on aneurysmal disease.  The patient is also encouraged to exercise a minimum of 30 minutes 4 times a week.   Should the patient develop new onset abdominal or back pain or signs of peripheral embolization they are instructed to seek medical attention immediately and to alert the physician providing care that they have an aneurysm.   The patient voices their understanding.  The patient  will return in 12 months with an aortic duplex.  2. Hypertension, essential, benign Continue antihypertensive medications as already ordered, these medications have been reviewed and there are no changes at this time.  3. Dyslipidemia Continue statin as ordered and reviewed, no changes at this time   Current Outpatient Medications on File Prior to Visit  Medication Sig Dispense Refill   augmented betamethasone dipropionate (DIPROLENE-AF) 0.05 % cream 1(one) application(s) topical 2(two) times a day 50 g 1   augmented betamethasone dipropionate (DIPROLENE-AF) 0.05 % cream Apply 1 Application topically 2 (two) times daily. 50 g 1   azelastine (ASTELIN) 0.1 % nasal spray Place 1 spray into both nostrils 2 (two) times daily. Use in each nostril as directed 30 mL 12   Cholecalciferol (VITAMIN D3) 1000 UNITS CAPS Take 1 capsule by mouth daily.     ferrous sulfate 325 (65 FE) MG tablet Take 1 tablet by mouth daily.     fexofenadine (ALLEGRA) 180 MG tablet Take 180 mg by mouth daily.     fluticasone (FLONASE) 50 MCG/ACT nasal spray Place 2 sprays into both nostrils daily. 16 g 5   losartan (COZAAR) 50 MG tablet Take 1 tablet (50 mg total) by mouth daily. 90 tablet 1   pantoprazole (PROTONIX) 40 MG tablet Take 1 tablet (40 mg total) by mouth daily. 90 tablet 3   rosuvastatin (CRESTOR) 20 MG tablet Take 1 tablet (20 mg total) by mouth  daily. 90 tablet 1   tadalafil (CIALIS) 10 MG tablet Take 1 tablet (10 mg total) by mouth every other day as needed for erectile dysfunction. 10 tablet 1   temazepam (RESTORIL) 15 MG capsule Take 1 capsule (15 mg total) by mouth at bedtime as needed for sleep. 90 capsule 0   No current facility-administered medications on file prior to visit.    There are no Patient Instructions on file for this visit. No follow-ups on file.   Georgiana Spinner, NP

## 2023-03-22 DIAGNOSIS — H903 Sensorineural hearing loss, bilateral: Secondary | ICD-10-CM | POA: Diagnosis not present

## 2023-03-22 DIAGNOSIS — H90A22 Sensorineural hearing loss, unilateral, left ear, with restricted hearing on the contralateral side: Secondary | ICD-10-CM | POA: Diagnosis not present

## 2023-04-01 ENCOUNTER — Other Ambulatory Visit: Payer: Self-pay

## 2023-04-20 NOTE — Progress Notes (Unsigned)
[facility] [address] [place], [place] [address]  Pulmonary Sleep Medicine   Office Visit Note  Patient Name: Dustin Cobb DOB: Sep 02, 1945 MRN [id number]    Chief Complaint: Obstructive Sleep Apnea visit  Brief History:  Dustin Cobb is seen today for a follow up visit for BiPAP@ 17/12 cmH2O. The patient has a 8 year history of sleep apnea. Patient is using PAP nightly.  The patient feels rested after sleeping with PAP.  The patient reports benefiting from PAP use. Reported sleepiness is  improved and the Epworth Sleepiness Score is 8 out of 24. The patient will occasionally take naps. The patient complains of the following: pt is need of a replacement unit. The compliance download shows 71% compliance with an average use time of 5 hours 20 minutes. The AHI is 3.5.  The patient does complain of limb movements disrupting sleep. The patient continues to require PAP therapy in order to eliminate sleep apnea.   ROS  General: (-) fever, (-) chills, (-) night sweat Nose and Sinuses: (-) nasal stuffiness or itchiness, (-) postnasal drip, (-) nosebleeds, (-) sinus trouble. Mouth and Throat: (-) sore throat, (-) hoarseness. Neck: (-) swollen glands, (-) enlarged thyroid, (-) neck pain. Respiratory: - cough, - shortness of breath, - wheezing. Neurologic: - numbness, - tingling. Psychiatric: - anxiety, - depression   Current Medication: Outpatient Encounter Medications as of 04/23/2023  Medication Sig   augmented betamethasone dipropionate (DIPROLENE-AF) 0.05 % cream 1(one) application(s) topical 2(two) times a day   augmented betamethasone dipropionate (DIPROLENE-AF) 0.05 % cream Apply 1 Application topically 2 (two) times daily.   azelastine (ASTELIN) 0.1 % nasal spray Place 1 spray into both nostrils 2 (two) times daily. Use in each nostril as directed   Cholecalciferol (VITAMIN D3) 1000 UNITS CAPS Take 1 capsule by mouth daily.   ferrous sulfate 325 (65 FE) MG tablet  Take 1 tablet by mouth daily.   fexofenadine (ALLEGRA) 180 MG tablet Take 180 mg by mouth daily.   fluticasone (FLONASE) 50 MCG/ACT nasal spray Place 2 sprays into both nostrils daily.   losartan (COZAAR) 50 MG tablet Take 1 tablet (50 mg total) by mouth daily.   pantoprazole (PROTONIX) 40 MG tablet Take 1 tablet (40 mg total) by mouth daily.   rosuvastatin (CRESTOR) 20 MG tablet Take 1 tablet (20 mg total) by mouth daily.   tadalafil (CIALIS) 10 MG tablet Take 1 tablet (10 mg total) by mouth every other day as needed for erectile dysfunction.   temazepam (RESTORIL) 15 MG capsule Take 1 capsule (15 mg total) by mouth at bedtime as needed for sleep.   No facility-administered encounter medications on file as of 04/23/2023.    Surgical History: Past Surgical History:  Procedure Laterality Date   CARDIAC CATHETERIZATION     COLONOSCOPY     COLONOSCOPY WITH PROPOFOL N/A 12/03/2020   Procedure: COLONOSCOPY WITH PROPOFOL;  Surgeon: Midge Minium, MD;  Location: [facility];  Service: Endoscopy;  Laterality: N/A;   ESOPHAGOGASTRODUODENOSCOPY (EGD) WITH PROPOFOL N/A 01/09/2022   Procedure: ESOPHAGOGASTRODUODENOSCOPY (EGD) WITH PROPOFOL;  Surgeon: Midge Minium, MD;  Location: [facility];  Service: Endoscopy;  Laterality: N/A;   HEMORRHOID BANDING  04/01/2012   Dr. Birdie Sons    Medical History: Past Medical History:  Diagnosis Date   Aching headache    Allergy    ED (erectile dysfunction)    GERD (gastroesophageal reflux disease)    Hyperlipidemia    Hypertension    Insomnia    Metabolic  syndrome    Onychomycosis    OSA on CPAP    Postural dizziness    Rash    Sleep apnea    Wears partial dentures     Family History: Non contributory to the present illness  Social History: Social History   Socioeconomic History   Marital status: Married    Spouse name: Mary   Number of children: 2   Years of education: Not on file   Highest education level: Associate degree:  academic program  Occupational History   Occupation: retired  Tobacco Use   Smoking status: Former    Current packs/day: 0.00    Average packs/day: 1 pack/day for 20.0 years (20.0 ttl pk-yrs)    Types: Cigarettes    Start date: 01/07/1975    Quit date: 01/07/1995    Years since quitting: 28.3   Smokeless tobacco: Never   Tobacco comments:    smoking cessation materials not required  Vaping Use   Vaping status: Never Used  Substance and Sexual Activity   Alcohol use: No   Drug use: No   Sexual activity: Yes    Partners: Female    Birth control/protection: None  Other Topics Concern   Not on file  Social History Narrative   Not on file   Social Determinants of Health   Financial Resource Strain: Low Risk  (12/28/2022)   Overall Financial Resource Strain (CARDIA)    Difficulty of Paying Living Expenses: Not hard at all  Food Insecurity: No Food Insecurity (12/28/2022)   Hunger Vital Sign    Worried About Running Out of Food in the Last Year: Never true    Ran Out of Food in the Last Year: Never true  Transportation Needs: No Transportation Needs (12/28/2022)   PRAPARE - Administrator, Civil Service (Medical): No    Lack of Transportation (Non-Medical): No  Physical Activity: Sufficiently Active (12/28/2022)   Exercise Vital Sign    Days of Exercise per Week: 2 days    Minutes of Exercise per Session: 100 min  Recent Concern: Physical Activity - Inactive (12/28/2022)   Exercise Vital Sign    Days of Exercise per Week: 0 days    Minutes of Exercise per Session: 0 min  Stress: No Stress Concern Present (12/28/2022)   [employer] of Occupational Health - Occupational Stress Questionnaire    Feeling of Stress : Only a little  Social Connections: Moderately Integrated (12/28/2022)   Social Connection and Isolation Panel [NHANES]    Frequency of Communication with Friends and Family: Once a week    Frequency of Social Gatherings with Friends and Family: Once a week     Attends Religious Services: More than 4 times per year    Active Member of [employer] or Organizations: Yes    Attends Engineer, structural: More than 4 times per year    Marital Status: Married  Catering manager Violence: Not At Risk (12/28/2022)   Humiliation, Afraid, Rape, and Kick questionnaire    Fear of Current or Ex-Partner: No    Emotionally Abused: No    Physically Abused: No    Sexually Abused: No    Vital Signs: Blood pressure 129/87, pulse 78, resp. rate 16, height 5\' 8"  (1.727 m), weight 205 lb (93 kg), SpO2 96%. Body mass index is 31.17 kg/m.    Examination: General Appearance: The patient is well-developed, well-nourished, and in no distress. Neck Circumference: 43 cm Skin: Gross inspection of skin unremarkable. Head: normocephalic, no  gross deformities. Eyes: no gross deformities noted. ENT: ears appear grossly normal Neurologic: Alert and oriented. No involuntary movements.  STOP BANG RISK ASSESSMENT S (snore) Have you been told that you snore?     NO   T (tired) Are you often tired, fatigued, or sleepy during the day?   NO  O (obstruction) Do you stop breathing, choke, or gasp during sleep? NO   P (pressure) Do you have or are you being treated for high blood pressure? YES   B (BMI) Is your body index greater than 35 kg/m? NO   A (age) Are you 44 years old or older? YES   N (neck) Do you have a neck circumference greater than 16 inches?   YES   G (gender) Are you a male? YES   TOTAL STOP/BANG "YES" ANSWERS 4       A STOP-Bang score of 2 or less is considered low risk, and a score of 5 or more is high risk for having either moderate or severe OSA. For people who score 3 or 4, doctors may need to perform further assessment to determine how likely they are to have OSA.         EPWORTH SLEEPINESS SCALE:  Scale:  (0)= no chance of dozing; (1)= slight chance of dozing; (2)= moderate chance of dozing; (3)= high chance of dozing  Chance  Situtation     Sitting and reading: 3    Watching TV: 2    Sitting Inactive in public: 0    As a passenger in car: 0      Lying down to rest: 3    Sitting and talking: 0    Sitting quielty after lunch: 0    In a car, stopped in traffic: 0   TOTAL SCORE:   8 out of 24    SLEEP STUDIES:  PSG (06/2014) AHI 10/hr, REM AHI 42/hr, min SpO2 87%   CPAP COMPLIANCE DATA:  Date Range: 01/23/2023-04/22/2023  Average Daily Use: 5 hours 20 minutes  Median Use: 5 hours 38 minutes  Compliance for > 4 Hours: 71%  AHI: 3.5 respiratory events per hour  Days Used: 70/90 days  Mask Leak: 65.9  95th Percentile Pressure: 17/12         LABS: Recent Results (from the past 2160 hour(s))  Lipid panel     Status: None   Collection Time: 02/15/23  9:01 AM  Result Value Ref Range   Cholesterol 152 <200 mg/dL   HDL 68 > OR = 40 mg/dL   Triglycerides 58 <161 mg/dL   LDL Cholesterol (Calc) 71 mg/dL (calc)    Comment: Reference range: <100 . Desirable range <100 mg/dL for primary prevention;   <70 mg/dL for patients with CHD or diabetic patients  with > or = 2 CHD risk factors. Marland Kitchen LDL-C is now calculated using the Martin-Hopkins  calculation, which is a validated novel method providing  better accuracy than the Friedewald equation in the  estimation of LDL-C.  Horald Pollen et al. Lenox Ahr. 0960;454(09): 2061-2068  (http://education.[facility].com/faq/FAQ164)    Total CHOL/HDL Ratio 2.2 <5.0 (calc)   Non-HDL Cholesterol (Calc) 84 <811 mg/dL (calc)    Comment: For patients with diabetes plus 1 major ASCVD risk  factor, treating to a non-HDL-C goal of <100 mg/dL  (LDL-C of <91 mg/dL) is considered a therapeutic  option.   CBC with Differential/Platelet     Status: None   Collection Time: 02/15/23  9:01 AM  Result Value Ref Range  WBC 5.5 3.8 - 10.8 Thousand/uL   RBC 5.20 4.20 - 5.80 Million/uL   Hemoglobin 14.8 13.2 - 17.1 g/dL   HCT 40.3 47.4 - 25.9 %   MCV 87.3 80.0 - 100.0 fL    MCH 28.5 27.0 - 33.0 pg   MCHC 32.6 32.0 - 36.0 g/dL   RDW 56.3 87.5 - 64.3 %   Platelets 202 140 - 400 Thousand/uL   MPV 9.4 7.5 - 12.5 fL   Neutro Abs 2,624 1,500 - 7,800 cells/uL   Lymphs Abs 2,008 850 - 3,900 cells/uL   Absolute Monocytes 611 200 - 950 cells/uL   Eosinophils Absolute 220 15 - 500 cells/uL   Basophils Absolute 39 0 - 200 cells/uL   Neutrophils Relative % 47.7 %   Total Lymphocyte 36.5 %   Monocytes Relative 11.1 %   Eosinophils Relative 4.0 %   Basophils Relative 0.7 %  COMPLETE METABOLIC PANEL WITH GFR     Status: None   Collection Time: 02/15/23  9:01 AM  Result Value Ref Range   Glucose, Bld 94 65 - 99 mg/dL    Comment: .            Fasting reference interval .    BUN 15 7 - 25 mg/dL   Creat 3.29 5.18 - 8.41 mg/dL   eGFR 63 > OR = 60 YS/AYT/0.16W1   BUN/Creatinine Ratio SEE NOTE: 6 - 22 (calc)    Comment:    Not Reported: BUN and Creatinine are within    reference range. .    Sodium 137 135 - 146 mmol/L   Potassium 4.6 3.5 - 5.3 mmol/L   Chloride 105 98 - 110 mmol/L   CO2 27 20 - 32 mmol/L   Calcium 9.9 8.6 - 10.3 mg/dL   Total Protein 7.0 6.1 - 8.1 g/dL   Albumin 4.1 3.6 - 5.1 g/dL   Globulin 2.9 1.9 - 3.7 g/dL (calc)   AG Ratio 1.4 1.0 - 2.5 (calc)   Total Bilirubin 0.6 0.2 - 1.2 mg/dL   Alkaline phosphatase (APISO) 81 35 - 144 U/L   AST 24 10 - 35 U/L   ALT 18 9 - 46 U/L  TSH     Status: None   Collection Time: 02/15/23  9:01 AM  Result Value Ref Range   TSH 0.48 0.40 - 4.50 mIU/L  Hemoglobin A1c     Status: Abnormal   Collection Time: 02/15/23  9:01 AM  Result Value Ref Range   Hgb A1c MFr Bld 6.1 (H) <5.7 % of total Hgb    Comment: For someone without known diabetes, a hemoglobin  A1c value between 5.7% and 6.4% is consistent with prediabetes and should be confirmed with a  follow-up test. . For someone with known diabetes, a value <7% indicates that their diabetes is well controlled. A1c targets should be individualized based  on duration of diabetes, age, comorbid conditions, and other considerations. . This assay result is consistent with an increased risk of diabetes. . Currently, no consensus exists regarding use of hemoglobin A1c for diagnosis of diabetes for children. .    Mean Plasma Glucose 128 mg/dL   eAG (mmol/L) 7.1 mmol/L    Comment: . This test was performed on the Roche cobas c503 platform. Effective 03/06/22, a change in test platforms from the Abbott Architect to the Roche cobas c503 may have shifted HbA1c results compared to historical results. Based on laboratory validation testing conducted at [facility], the Roche platform relative to  the Abbott platform had an average increase in HbA1c value of < or = 0.3%. This difference is within accepted  variability established by the [facility]. Note that not all individuals will have had a shift in their results and direct comparisons between historical and current results for testing conducted on different platforms is not recommended.   Prolactin     Status: None   Collection Time: 02/15/23  9:01 AM  Result Value Ref Range   Prolactin 4.2 2.0 - 18.0 ng/mL  VITAMIN D 25 Hydroxy (Vit-D Deficiency, Fractures)     Status: None   Collection Time: 02/15/23  9:01 AM  Result Value Ref Range   Vit D, 25-Hydroxy 51 30 - 100 ng/mL    Comment: Vitamin D Status         25-OH Vitamin D: . Deficiency:                    <20 ng/mL Insufficiency:             20 - 29 ng/mL Optimal:                 > or = 30 ng/mL . For 25-OH Vitamin D testing on patients on  D2-supplementation and patients for whom quantitation  of D2 and D3 fractions is required, the [facility](TM) 25-OH VIT D, (D2,D3), LC/MS/MS is recommended: order  code 66440 (patients >61yrs). . See Note 1 . Note 1 . For additional information, please refer to  http://education.[facility].com/faq/FAQ199  (This link is being provided for  informational/ educational purposes only.)     Radiology: No results found.  No results found.  No results found.    Assessment and Plan: Patient Active Problem List   Diagnosis Date Noted   Gastritis without bleeding    Aortic aneurysm without rupture (HCC) 08/23/2021   Pituitary microadenoma (HCC) 08/23/2021   Abdominal aortic aneurysm (AAA) without rupture (HCC) 08/23/2021   OSA treated with BiPAP 07/11/2021   Encounter for BiPAP use counseling 07/11/2021   Encounter for screening colonoscopy    Lichen simplex chronicus 02/13/2018   Rash of hands 08/10/2016   Hypertension, essential, benign 01/07/2015   GERD (gastroesophageal reflux disease) 01/07/2015   OSA on CPAP 01/07/2015   Low TSH level 01/07/2015   Hyperglycemia 01/07/2015   Obesity (BMI 30.0-34.9) 01/07/2015   Dyslipidemia 01/07/2015   History of hemorrhoidectomy 01/07/2015   History of iron deficiency anemia 01/07/2015   Allergic rhinitis 01/07/2015   Metabolic syndrome 01/07/2015   1. OSA treated with BiPAP The patient does tolerate PAP and reports  benefit from PAP use. His machine is past end of life and must be replaced. He has high mask leak which disturbs his sleep, and we discussed that it would be a good idea to try a different mask when he gets setup with a new machine. The patient was reminded how to clean equipment and advised to replace supplies routinely. The patient was also counselled on weight loss. The compliance is fair. The AHI is 3.5.   OSA on cpap- controlled. Replace machine.  Continue with excellent compliance with pap. CPAP continues to be medically necessary to treat this patient's OSA. F/u after setup.   2. Encounter for BiPAP use counseling  Counseling: had a lengthy discussion with the patient regarding the importance of PAP therapy in management of the sleep apnea. Patient appears to understand the risk factor reduction and also understands the risks associated with untreated sleep  apnea.  Patient will try to make a good faith effort to remain compliant with therapy. Also instructed the patient on proper cleaning of the device including the water must be changed daily if possible and use of distilled water is preferred. Patient understands that the machine should be regularly cleaned with appropriate recommended cleaning solutions that do not damage the PAP machine for example given white vinegar and water rinses. Other methods such as ozone treatment may not be as good as these simple methods to achieve cleaning.   3. Hypertension, essential, benign Controlled with losartan. Continue.   4. Restless legs syndrome Mild and infrequent, generally relieved by stretching.     General Counseling: I have discussed the findings of the evaluation and examination with Dustin Cobb.  I have also discussed any further diagnostic evaluation thatmay be needed or ordered today. Linken verbalizes understanding of the findings of todays visit. We also reviewed his medications today and discussed drug interactions and side effects including but not limited excessive drowsiness and altered mental states. We also discussed that there is always a risk not just to him but also people around him. he has been encouraged to call the office with any questions or concerns that should arise related to todays visit.  No orders of the defined types were placed in this encounter.       I have personally obtained a history, examined the patient, evaluated laboratory and imaging results, formulated the assessment and plan and placed orders. This patient was seen today by Emmaline Kluver, PA-C in collaboration with Dr. Freda Munro.   Yevonne Pax, MD [facility] Diplomate ABMS Pulmonary Critical Care Medicine and Sleep Medicine

## 2023-04-23 ENCOUNTER — Ambulatory Visit (INDEPENDENT_AMBULATORY_CARE_PROVIDER_SITE_OTHER): Payer: PPO | Admitting: Internal Medicine

## 2023-04-23 VITALS — BP 129/87 | HR 78 | Resp 16 | Ht 68.0 in | Wt 205.0 lb

## 2023-04-23 DIAGNOSIS — G2581 Restless legs syndrome: Secondary | ICD-10-CM | POA: Diagnosis not present

## 2023-04-23 DIAGNOSIS — G4733 Obstructive sleep apnea (adult) (pediatric): Secondary | ICD-10-CM | POA: Diagnosis not present

## 2023-04-23 DIAGNOSIS — Z7189 Other specified counseling: Secondary | ICD-10-CM

## 2023-04-23 DIAGNOSIS — I10 Essential (primary) hypertension: Secondary | ICD-10-CM

## 2023-04-23 NOTE — Patient Instructions (Signed)

## 2023-04-30 ENCOUNTER — Telehealth: Payer: Self-pay | Admitting: Family Medicine

## 2023-04-30 NOTE — Telephone Encounter (Signed)
Karna Christmas, from [facility], has called in regards to this patient saw Dr Carlynn Purl on 07/06/2014 , before his sleep study, and the notes from that date of service that they have are not signed by Dr Carlynn Purl and per insurance requirements, they need these notes signed by provider and faxed back to [facility] Fax # [phone].  [facility] # [phone] EXT [phone] Karna Christmas)

## 2023-04-30 NOTE — Telephone Encounter (Signed)
Called Dustin Cobb back and advised if she could send over to review what needs to be signed by PCP, fax number given [phone]

## 2023-05-08 NOTE — Telephone Encounter (Signed)
General: Dustin Cobb called from [facility]. She stated that she is missing doctors signature on chart notes. She advised that she would like an attestation signed and faxed back over to [phone].

## 2023-05-08 NOTE — Telephone Encounter (Signed)
re-faxed

## 2023-05-16 ENCOUNTER — Other Ambulatory Visit: Payer: Self-pay

## 2023-06-06 DIAGNOSIS — H35373 Puckering of macula, bilateral: Secondary | ICD-10-CM | POA: Diagnosis not present

## 2023-06-06 DIAGNOSIS — H2513 Age-related nuclear cataract, bilateral: Secondary | ICD-10-CM | POA: Diagnosis not present

## 2023-06-07 DIAGNOSIS — H903 Sensorineural hearing loss, bilateral: Secondary | ICD-10-CM | POA: Diagnosis not present

## 2023-07-25 ENCOUNTER — Other Ambulatory Visit: Payer: Self-pay

## 2023-08-17 ENCOUNTER — Ambulatory Visit: Payer: PPO | Admitting: Family Medicine

## 2023-08-17 ENCOUNTER — Other Ambulatory Visit: Payer: Self-pay

## 2023-08-17 ENCOUNTER — Encounter: Payer: Self-pay | Admitting: Family Medicine

## 2023-08-17 VITALS — BP 112/74 | HR 74 | Resp 16 | Ht 68.0 in | Wt 202.9 lb

## 2023-08-17 DIAGNOSIS — I7143 Infrarenal abdominal aortic aneurysm, without rupture: Secondary | ICD-10-CM | POA: Diagnosis not present

## 2023-08-17 DIAGNOSIS — D352 Benign neoplasm of pituitary gland: Secondary | ICD-10-CM

## 2023-08-17 DIAGNOSIS — F5101 Primary insomnia: Secondary | ICD-10-CM

## 2023-08-17 DIAGNOSIS — J3089 Other allergic rhinitis: Secondary | ICD-10-CM | POA: Diagnosis not present

## 2023-08-17 DIAGNOSIS — I10 Essential (primary) hypertension: Secondary | ICD-10-CM

## 2023-08-17 DIAGNOSIS — J302 Other seasonal allergic rhinitis: Secondary | ICD-10-CM | POA: Diagnosis not present

## 2023-08-17 DIAGNOSIS — E559 Vitamin D deficiency, unspecified: Secondary | ICD-10-CM

## 2023-08-17 DIAGNOSIS — K219 Gastro-esophageal reflux disease without esophagitis: Secondary | ICD-10-CM | POA: Diagnosis not present

## 2023-08-17 DIAGNOSIS — G4733 Obstructive sleep apnea (adult) (pediatric): Secondary | ICD-10-CM

## 2023-08-17 DIAGNOSIS — N528 Other male erectile dysfunction: Secondary | ICD-10-CM | POA: Diagnosis not present

## 2023-08-17 DIAGNOSIS — E785 Hyperlipidemia, unspecified: Secondary | ICD-10-CM

## 2023-08-17 DIAGNOSIS — E038 Other specified hypothyroidism: Secondary | ICD-10-CM

## 2023-08-17 DIAGNOSIS — Z8719 Personal history of other diseases of the digestive system: Secondary | ICD-10-CM

## 2023-08-17 MED ORDER — LOSARTAN POTASSIUM 50 MG PO TABS
50.0000 mg | ORAL_TABLET | Freq: Every day | ORAL | 1 refills | Status: DC
  Filled 2023-08-17 – 2023-09-26 (×2): qty 90, 90d supply, fill #0
  Filled 2023-12-24: qty 90, 90d supply, fill #1

## 2023-08-17 MED ORDER — ROSUVASTATIN CALCIUM 40 MG PO TABS
40.0000 mg | ORAL_TABLET | Freq: Every day | ORAL | 1 refills | Status: DC
  Filled 2023-08-17 – 2023-09-26 (×2): qty 90, 90d supply, fill #0
  Filled 2024-01-03: qty 90, 90d supply, fill #1

## 2023-08-17 MED ORDER — TADALAFIL 10 MG PO TABS
10.0000 mg | ORAL_TABLET | ORAL | 1 refills | Status: DC | PRN
  Filled 2023-08-17: qty 10, 20d supply, fill #0

## 2023-08-17 MED ORDER — TEMAZEPAM 15 MG PO CAPS
15.0000 mg | ORAL_CAPSULE | Freq: Every evening | ORAL | 1 refills | Status: DC | PRN
  Filled 2023-08-17 – 2023-09-12 (×2): qty 90, 90d supply, fill #0
  Filled 2023-12-24: qty 90, 90d supply, fill #1

## 2023-08-17 MED ORDER — TADALAFIL 10 MG PO TABS: 10.0000 mg | ORAL_TABLET | ORAL | 1 refills | Status: AC | PRN

## 2023-08-17 MED ORDER — PANTOPRAZOLE SODIUM 40 MG PO TBEC
40.0000 mg | DELAYED_RELEASE_TABLET | Freq: Every day | ORAL | 1 refills | Status: DC
Start: 2023-08-17 — End: 2024-02-18
  Filled 2023-08-17: qty 180, 180d supply, fill #0
  Filled 2023-10-15: qty 90, 90d supply, fill #0
  Filled 2024-01-10: qty 90, 90d supply, fill #1

## 2023-08-17 MED ORDER — MONTELUKAST SODIUM 10 MG PO TABS
10.0000 mg | ORAL_TABLET | Freq: Every day | ORAL | 1 refills | Status: DC
Start: 2023-08-17 — End: 2024-02-18
  Filled 2023-08-17 – 2023-09-26 (×2): qty 90, 90d supply, fill #0
  Filled 2023-12-24: qty 90, 90d supply, fill #1

## 2023-08-17 NOTE — Progress Notes (Signed)
 Name: Dustin Cobb   MRN: [id number]    DOB: Sep 03, 1945   Date:08/17/2023       Progress Note  Subjective  Chief Complaint  Chief Complaint  Patient presents with   Medical Management of Chronic Issues   HPI   Hyperlipidemia: taking Rosuvastatin now, no side effects, no chest pain or myalgias, last LDL was stable at 71    HTN: he has been taking losartan 50 mg and tolerating it well,  BP today is towards low end of normal, past few visits within normal range. Denies orthostatic changes. He will monitor bp more often at home to make sure it is closer to 120-130 range . No headaches or chest pain    Abdominal aorta aneurysm: found on screen [place] in 2020 , size was 3.1 cm and repeat study done 02/2022 was stable size was 3.2 cm and he is due for follow up with Fall. Taking statins , cannot tolerate aspirin    Hyperglycemia/Pre diabetes :  hgbA1C is trending up from 5.9 % to 6.1 %  No polyphagia, polydipsia or polyuria He has been cutting down on sweets . Discussed cutting down on carbohydrates    Low TSH: seen by Dr. Tedd Sias in the past , TSH has been slightly suppressed but stable. Unchanged    GERD: he had a repeat Endoscopy 2023  and showed chronic gastritis with some active disease and he is now taking Pantoprazole and symptoms are still present , Dr. Servando Snare advised to take twice daily prn when his symptoms are worse and needs to take rollaids multiple times a day   OSA: he has been wearing BIpap most nights, missed appointment with Dr. Belia Heman but already re-scheduled it for Nov  He wakes up feeling rested most days. He states no leakage , pressure seems okay for him , he states sometimes has restless sleep, melatonin did not help and he stopped He wakes up feeling tired, we sent rx for Belsomra but not covered by insurance so we changed to Temazepam, he is sleeping better no longer waking up during the night   Obesity: discussed trying to go down to below 200 lbs. He is more aware of portion  sizes, he is eating more vegetables, weight today is 202 lbs   AR: on nasal steroid and claritin, still has intermittent symptoms. Worse during Fall and Spring Does not like azelastine , we will try montelukast at night    ED: he states he tried viagra years ago and it caused headache, he would like to try something else. He states does not have intercourse often , he takes Cialis prn and is stable    Sellar Mass: MRI ordered by ENT for evaluation of hearing loss, incidental finding of pituitary adenoma, seen by Dr. Adriana Simas and per patient he said to repeat in 2025 since stable findings.  He denies double vision, balance problems or weakness    Last  April 2023    IMPRESSION: 1. Equivocal 3 mm hypoenhancing focus only seen on coronal postcontrast images within the pituitary gland at midline, may represent a pituitary macro adenoma, Rathke's cleft cyst versus artifact. 2. Mild chronic microvascular ischemic changes of the white matter.  Patient Active Problem List   Diagnosis Date Noted   Restless legs syndrome 04/23/2023   Gastritis without bleeding    Aortic aneurysm without rupture (HCC) 08/23/2021   Pituitary microadenoma (HCC) 08/23/2021   Abdominal aortic aneurysm (AAA) without rupture (HCC) 08/23/2021   OSA treated with BiPAP  07/11/2021   Encounter for BiPAP use counseling 07/11/2021   Encounter for screening colonoscopy    Lichen simplex chronicus 02/13/2018   Rash of hands 08/10/2016   Hypertension, essential, benign 01/07/2015   GERD (gastroesophageal reflux disease) 01/07/2015   OSA on CPAP 01/07/2015   Low TSH level 01/07/2015   Hyperglycemia 01/07/2015   Obesity (BMI 30.0-34.9) 01/07/2015   Dyslipidemia 01/07/2015   History of hemorrhoidectomy 01/07/2015   History of iron deficiency anemia 01/07/2015   Allergic rhinitis 01/07/2015   Metabolic syndrome 01/07/2015    Past Surgical History:  Procedure Laterality Date   CARDIAC CATHETERIZATION     COLONOSCOPY      COLONOSCOPY WITH PROPOFOL N/A 12/03/2020   Procedure: COLONOSCOPY WITH PROPOFOL;  Surgeon: Midge Minium, MD;  Location[facility];  Service: Endoscopy;  Laterality: N/A;   ESOPHAGOGASTRODUODENOSCOPY (EGD) WITH PROPOFOL N/A 01/09/2022   Procedure: ESOPHAGOGASTRODUODENOSCOPY (EGD) WITH PROPOFOL;  Surgeon: Midge Minium, MD;  Location: [facility];  Service: Endoscopy;  Laterality: N/A;   HEMORRHOID BANDING  04/01/2012   Dr. Birdie Sons    Family History  Problem Relation Age of Onset   Cancer Mother        Thyroid   Emphysema Father    COPD Father    Hypertension Sister    Hypertension Brother    Heart disease Brother    Healthy Daughter     Social History   Tobacco Use   Smoking status: Former    Current packs/day: 0.00    Average packs/day: 1 pack/day for 20.0 years (20.0 ttl pk-yrs)    Types: Cigarettes    Start date: 01/07/1975    Quit date: 01/07/1995    Years since quitting: 28.6   Smokeless tobacco: Never   Tobacco comments:    smoking cessation materials not required  Substance Use Topics   Alcohol use: No     Current Outpatient Medications:    augmented betamethasone dipropionate (DIPROLENE-AF) 0.05 % cream, 1(one) application(s) topical 2(two) times a day, Disp: 50 g, Rfl: 1   augmented betamethasone dipropionate (DIPROLENE-AF) 0.05 % cream, Apply 1 Application topically 2 (two) times daily., Disp: 50 g, Rfl: 1   azelastine (ASTELIN) 0.1 % nasal spray, Place 1 spray into both nostrils 2 (two) times daily. Use in each nostril as directed, Disp: 30 mL, Rfl: 12   Cholecalciferol (VITAMIN D3) 1000 UNITS CAPS, Take 1 capsule by mouth daily., Disp: , Rfl:    ferrous sulfate 325 (65 FE) MG tablet, Take 1 tablet by mouth daily., Disp: , Rfl:    fexofenadine (ALLEGRA) 180 MG tablet, Take 180 mg by mouth daily., Disp: , Rfl:    fluticasone (FLONASE) 50 MCG/ACT nasal spray, Place 2 sprays into both nostrils daily., Disp: 16 g, Rfl: 5   losartan (COZAAR) 50 MG tablet,  Take 1 tablet (50 mg total) by mouth daily., Disp: 90 tablet, Rfl: 1   pantoprazole (PROTONIX) 40 MG tablet, Take 1 tablet (40 mg total) by mouth daily., Disp: 90 tablet, Rfl: 3   rosuvastatin (CRESTOR) 20 MG tablet, Take 1 tablet (20 mg total) by mouth daily., Disp: 90 tablet, Rfl: 1   tadalafil (CIALIS) 10 MG tablet, Take 1 tablet (10 mg total) by mouth every other day as needed for erectile dysfunction., Disp: 10 tablet, Rfl: 1   temazepam (RESTORIL) 15 MG capsule, Take 1 capsule (15 mg total) by mouth at bedtime as needed for sleep., Disp: 90 capsule, Rfl: 0  No Known Allergies  I personally reviewed active  problem list, medication list, allergies with the patient/caregiver today.   ROS  Ten systems reviewed and is negative except as mentioned in HPI    Objective  Vitals:   08/17/23 0821  BP: 112/74  Pulse: 74  Resp: 16  SpO2: 95%  Weight: 202 lb 14.4 oz (92 kg)  Height: 5\' 8"  (1.727 m)    Body mass index is 30.85 kg/m.  Physical Exam  Constitutional: Patient appears well-developed and well-nourished. Obese  No distress.  HEENT: head atraumatic, normocephalic, pupils equal and reactive to light, neck supple Cardiovascular: Normal rate, regular rhythm and normal heart sounds.  No murmur heard. No BLE edema. Pulmonary/Chest: Effort normal and breath sounds normal. No respiratory distress. Abdominal: Soft.  There is no tenderness. Psychiatric: Patient has a normal mood and affect. behavior is normal. Judgment and thought content normal.   Diabetic Foot Exam:     PHQ2/9:    08/17/2023    8:18 AM 12/28/2022    8:06 AM 08/24/2022    8:52 AM 02/24/2022    9:46 AM 08/23/2021    1:46 PM  Depression screen PHQ 2/9  Decreased Interest 0 0 0 0 0  Down, Depressed, Hopeless 0 0 1 0 0  PHQ - 2 Score 0 0 1 0 0  Altered sleeping 0 1 3 0 0  Tired, decreased energy 0 1 2 0 0  Change in appetite 0 0 0 0 0  Feeling bad or failure about yourself  0 0 0 0 0  Trouble concentrating 0  0 0 0 0  Moving slowly or fidgety/restless 0 0 0 0 0  Suicidal thoughts 0 0 0 0 0  PHQ-9 Score 0 2 6 0 0  Difficult doing work/chores Not difficult at all Not difficult at all Not difficult at all      phq 9 is negative  Fall Risk:    08/17/2023    8:18 AM 02/15/2023    8:03 AM 12/28/2022    8:06 AM 08/24/2022   11:47 AM 08/24/2022    8:52 AM  Fall Risk   Falls in the past year? 0 0 0 0 0  Number falls in past yr: 0  0    Injury with Fall? 0  0    Risk for fall due to : No Fall Risks No Fall Risks No Fall Risks  No Fall Risks  Follow up Falls prevention discussed;Education provided;Falls evaluation completed Falls prevention discussed Falls prevention discussed;Education provided;Falls evaluation completed  Falls prevention discussed     Assessment & Plan  1. OSA on CPAP (Primary)  He is trying to replaced his cpap but too costly   2. Infrarenal abdominal aortic aneurysm (AAA) without rupture (HCC)  We will adjust dose of crestor to 40 mg daily   3. Pituitary microadenoma (HCC)  No headache or double vistion   4. Hypertension, essential, benign  - losartan (COZAAR) 50 MG tablet; Take 1 tablet (50 mg total) by mouth daily.  Dispense: 90 tablet; Refill: 1  5. Dyslipidemia  - rosuvastatin (CRESTOR) 40 MG tablet; Take 1 tablet (40 mg total) by mouth daily.  Dispense: 90 tablet; Refill: 1  6. Primary insomnia  - temazepam (RESTORIL) 15 MG capsule; Take 1 capsule (15 mg total) by mouth at bedtime as needed for sleep.  Dispense: 90 capsule; Refill: 1  7. Perennial allergic rhinitis with seasonal variation  - montelukast (SINGULAIR) 10 MG tablet; Take 1 tablet (10 mg total) by mouth at bedtime.  Dispense:  90 tablet; Refill: 1  8. Other male erectile dysfunction  - tadalafil (CIALIS) 10 MG tablet; Take 1 tablet (10 mg total) by mouth every other day as needed for erectile dysfunction.  Dispense: 30 tablet; Refill: 1  9. Subclinical hypothyroidism  We will continue to  check TSH prn   10. Vitamin D deficiency  Continue supplementation   11. Gastroesophageal reflux disease without esophagitis  - pantoprazole (PROTONIX) 40 MG tablet; Take 1 tablet (40 mg total) by mouth daily.  Dispense: 180 tablet; Refill: 1  12. History of gastritis  - pantoprazole (PROTONIX) 40 MG tablet; Take 1 tablet (40 mg total) by mouth daily.  Dispense: 180 tablet; Refill: 1

## 2023-09-12 ENCOUNTER — Other Ambulatory Visit: Payer: Self-pay

## 2023-09-13 ENCOUNTER — Other Ambulatory Visit: Payer: Self-pay

## 2023-09-26 ENCOUNTER — Other Ambulatory Visit: Payer: Self-pay

## 2023-10-15 ENCOUNTER — Other Ambulatory Visit: Payer: Self-pay

## 2023-10-16 ENCOUNTER — Encounter (INDEPENDENT_AMBULATORY_CARE_PROVIDER_SITE_OTHER): Payer: Self-pay

## 2023-12-31 ENCOUNTER — Encounter: Payer: Self-pay | Admitting: Family Medicine

## 2024-01-07 ENCOUNTER — Encounter: Payer: Self-pay | Admitting: Family Medicine

## 2024-01-07 ENCOUNTER — Ambulatory Visit (INDEPENDENT_AMBULATORY_CARE_PROVIDER_SITE_OTHER): Admitting: Family Medicine

## 2024-01-07 VITALS — BP 114/74 | HR 62 | Resp 16 | Ht 66.0 in | Wt 203.9 lb

## 2024-01-07 DIAGNOSIS — Z0000 Encounter for general adult medical examination without abnormal findings: Secondary | ICD-10-CM

## 2024-01-07 NOTE — Progress Notes (Signed)
 Name: Dustin Cobb   MRN: [id number]    DOB: 05/17/1946   Date:01/07/2024       Progress Note  Subjective  Chief Complaint  Chief Complaint  Patient presents with   Annual Exam    HPI  Patient presents for annual CPE .   IPSS     Row Name 01/07/24 1008         International Prostate Symptom Score   How often have you had the sensation of not emptying your bladder? Not at All     How often have you had to urinate less than every two hours? Not at All     How often have you found you stopped and started again several times when you urinated? Not at All     How often have you found it difficult to postpone urination? Not at All     How often have you had a weak urinary stream? About half the time     How often have you had to strain to start urination? Not at All     How many times did you typically get up at night to urinate? None     Total IPSS Score 3       Quality of Life due to urinary symptoms   If you were to spend the rest of your life with your urinary condition just the way it is now how would you feel about that? Pleased        Diet: eats mostly at home, balanced diet  Exercise: working in his yard , he has a garden Last Dental Exam: due for an appointment  Last Eye Exam: up to date  Depression: phq 9 is negative    01/07/2024   10:08 AM 08/17/2023    8:18 AM 12/28/2022    8:06 AM 08/24/2022    8:52 AM 02/24/2022    9:46 AM  Depression screen PHQ 2/9  Decreased Interest 0 0 0 0 0  Down, Depressed, Hopeless 0 0 0 1 0  PHQ - 2 Score 0 0 0 1 0  Altered sleeping  0 1 3 0  Tired, decreased energy  0 1 2 0  Change in appetite  0 0 0 0  Feeling bad or failure about yourself   0 0 0 0  Trouble concentrating  0 0 0 0  Moving slowly or fidgety/restless  0 0 0 0  Suicidal thoughts  0 0 0 0  PHQ-9 Score  0 2 6 0  Difficult doing work/chores  Not difficult at all Not difficult at all Not difficult at all     Hypertension:  BP Readings from Last 3 Encounters:   01/07/24 114/74  08/17/23 112/74  04/23/23 129/87    Obesity: Wt Readings from Last 3 Encounters:  01/07/24 203 lb 14.4 oz (92.5 kg)  08/17/23 202 lb 14.4 oz (92 kg)  04/23/23 205 lb (93 kg)   BMI Readings from Last 3 Encounters:  01/07/24 32.91 kg/m  08/17/23 30.85 kg/m  04/23/23 31.17 kg/m     [employer] Visit from 01/07/2024 in [facility]  AUDIT-C Score 0    Married STD testing and prevention (HIV/chl/gon/syphilis):  not applicable Sexual history: no problems Hep C Screening: completed Skin cancer: Discussed monitoring for atypical lesions Colorectal cancer: N/A. Last one was done 2022 - aged out  Prostate cancer:  not applicable Lab Results  Component Value Date   PSA Normal 10/17/2013  Lung cancer:  Low Dose CT Chest recommended if Age 77-80 years, 30 pack-year currently smoking OR have quit w/in 15years. Patient  is not a candidate for screening   AAA: positive screen in 2020 showed 3.1 cm proximal aorta aneurysm - under the care of vascular surgeon ECG:  2020  Vaccines: reviewed with the patient.   Advanced Care Planning: A voluntary discussion about advance care planning including the explanation and discussion of advance directives.  Discussed health care proxy and Living will, and the patient was able to identify a health care proxy as wife .  Patient does not have a living will and power of attorney of health care   Patient Active Problem List   Diagnosis Date Noted   Restless legs syndrome 04/23/2023   Gastritis without bleeding    Aortic aneurysm without rupture (HCC) 08/23/2021   Pituitary microadenoma (HCC) 08/23/2021   Abdominal aortic aneurysm (AAA) without rupture (HCC) 08/23/2021   OSA treated with BiPAP 07/11/2021   Encounter for BiPAP use counseling 07/11/2021   Encounter for screening colonoscopy    Lichen simplex chronicus 02/13/2018   Rash of hands 08/10/2016   Hypertension, essential, benign  01/07/2015   GERD (gastroesophageal reflux disease) 01/07/2015   OSA on CPAP 01/07/2015   Low TSH level 01/07/2015   Hyperglycemia 01/07/2015   Obesity (BMI 30.0-34.9) 01/07/2015   Dyslipidemia 01/07/2015   History of hemorrhoidectomy 01/07/2015   History of iron deficiency anemia 01/07/2015   Allergic rhinitis 01/07/2015   Metabolic syndrome 01/07/2015    Past Surgical History:  Procedure Laterality Date   CARDIAC CATHETERIZATION     COLONOSCOPY     COLONOSCOPY WITH PROPOFOL  N/A 12/03/2020   Procedure: COLONOSCOPY WITH PROPOFOL ;  Surgeon: Jinny Carmine, MD;  Location: [facility];  Service: Endoscopy;  Laterality: N/A;   ESOPHAGOGASTRODUODENOSCOPY (EGD) WITH PROPOFOL  N/A 01/09/2022   Procedure: ESOPHAGOGASTRODUODENOSCOPY (EGD) WITH PROPOFOL ;  Surgeon: Jinny Carmine, MD;  Location: [facility] ENDOSCOPY;  Service: Endoscopy;  Laterality: N/A;   HEMORRHOID BANDING  04/01/2012   Dr. Lovenia    Family History  Problem Relation Age of Onset   Cancer Mother        Thyroid    Emphysema Father    COPD Father    Hypertension Sister    Hypertension Brother    Heart disease Brother    Healthy Daughter     Social History   Socioeconomic History   Marital status: Married    Spouse name: Mary   Number of children: 2   Years of education: Not on file   Highest education level: Associate degree: academic program  Occupational History   Occupation: retired  Tobacco Use   Smoking status: Former    Current packs/day: 0.00    Average packs/day: 1.3 packs/day for 30.0 years (40.0 ttl pk-yrs)    Types: Cigarettes    Start date: 01/07/1975    Quit date: 01/07/1995    Years since quitting: 29.0   Smokeless tobacco: Never   Tobacco comments:    smoking cessation materials not required  Vaping Use   Vaping status: Never Used  Substance and Sexual Activity   Alcohol use: No   Drug use: No   Sexual activity: Yes    Partners: Female    Birth control/protection: None  Other Topics  Concern   Not on file  Social History Narrative   Not on file   Social Drivers of Health   Financial Resource Strain: Low Risk  (01/03/2024)   Overall Financial  Resource Strain (CARDIA)    Difficulty of Paying Living Expenses: Not hard at all  Food Insecurity: No Food Insecurity (01/03/2024)   Hunger Vital Sign    Worried About Running Out of Food in the Last Year: Never true    Ran Out of Food in the Last Year: Never true  Transportation Needs: No Transportation Needs (01/03/2024)   PRAPARE - Administrator, Civil Service (Medical): No    Lack of Transportation (Non-Medical): No  Physical Activity: Sufficiently Active (01/03/2024)   Exercise Vital Sign    Days of Exercise per Week: 3 days    Minutes of Exercise per Session: 60 min  Stress: No Stress Concern Present (01/03/2024)   [employer] of Occupational Health - Occupational Stress Questionnaire    Feeling of Stress: Only a little  Social Connections: Socially Integrated (01/03/2024)   Social Connection and Isolation Panel    Frequency of Communication with Friends and Family: More than three times a week    Frequency of Social Gatherings with Friends and Family: Once a week    Attends Religious Services: More than 4 times per year    Active Member of [employer] or Organizations: Yes    Attends Engineer, structural: More than 4 times per year    Marital Status: Married  Catering manager Violence: Not At Risk (01/07/2024)   Humiliation, Afraid, Rape, and Kick questionnaire    Fear of Current or Ex-Partner: No    Emotionally Abused: No    Physically Abused: No    Sexually Abused: No     Current Outpatient Medications:    augmented betamethasone dipropionate (DIPROLENE-AF) 0.05 % cream, 1(one) application(s) topical 2(two) times a day, Disp: 50 g, Rfl: 1   augmented betamethasone dipropionate (DIPROLENE-AF) 0.05 % cream, Apply 1 Application topically 2 (two) times daily., Disp: 50 g, Rfl: 1   Cholecalciferol  (VITAMIN D3) 1000 UNITS CAPS, Take 1 capsule by mouth daily., Disp: , Rfl:    ferrous sulfate 325 (65 FE) MG tablet, Take 1 tablet by mouth daily., Disp: , Rfl:    fexofenadine (ALLEGRA) 180 MG tablet, Take 180 mg by mouth daily., Disp: , Rfl:    fluticasone (FLONASE) 50 MCG/ACT nasal spray, Place 2 sprays into both nostrils daily., Disp: 16 g, Rfl: 5   losartan (COZAAR) 50 MG tablet, Take 1 tablet (50 mg total) by mouth daily., Disp: 90 tablet, Rfl: 1   montelukast (SINGULAIR) 10 MG tablet, Take 1 tablet (10 mg total) by mouth at bedtime., Disp: 90 tablet, Rfl: 1   pantoprazole (PROTONIX) 40 MG tablet, Take 1 tablet (40 mg total) by mouth daily., Disp: 180 tablet, Rfl: 1   rosuvastatin (CRESTOR) 40 MG tablet, Take 1 tablet (40 mg total) by mouth daily., Disp: 90 tablet, Rfl: 1   tadalafil (CIALIS) 10 MG tablet, Take 1 tablet (10 mg total) by mouth every other day as needed for erectile dysfunction., Disp: 30 tablet, Rfl: 1   temazepam (RESTORIL) 15 MG capsule, Take 1 capsule (15 mg total) by mouth at bedtime as needed for sleep., Disp: 90 capsule, Rfl: 1  No Known Allergies   ROS  Constitutional: Negative for fever or weight change.  Respiratory: Negative for cough and shortness of breath.   Cardiovascular: Negative for chest pain or palpitations.  Gastrointestinal: Negative for abdominal pain, no bowel changes.  Musculoskeletal: Negative for gait problem or joint swelling.  Skin: Negative for rash.  Neurological: Negative for dizziness or headache.  No other  specific complaints in a complete review of systems (except as listed in HPI above).    Objective  Vitals:   01/07/24 1012  BP: 114/74  Pulse: 62  Resp: 16  SpO2: 93%  Weight: 203 lb 14.4 oz (92.5 kg)  Height: 5' 6 (1.676 m)    Body mass index is 32.91 kg/m.  Physical Exam  Constitutional: Patient appears well-developed and well-nourished. No distress.  HENT: Head: Normocephalic and atraumatic. Ears: B TMs ok, no  erythema or effusion; Nose: Nose normal. Mouth/Throat: Oropharynx is clear and moist. No oropharyngeal exudate.  Eyes: Conjunctivae and EOM are normal. Pupils are equal, round, and reactive to light. No scleral icterus.  Neck: Normal range of motion. Neck supple. No JVD present. No thyromegaly present.  Cardiovascular: Normal rate, regular rhythm and normal heart sounds.  No murmur heard. No BLE edema. Pulmonary/Chest: Effort normal and breath sounds normal. No respiratory distress. Abdominal: Soft. Bowel sounds are normal, no distension. There is no tenderness. no masses MALE GENITALIA: Normal descended testes bilaterally, no masses palpated, no hernias, no lesions, no discharge RECTAL:not done  Musculoskeletal: Normal range of motion, no joint effusions. No gross deformities Neurological: he is alert and oriented to person, place, and time. No cranial nerve deficit. Coordination, balance, strength, speech and gait are normal.  Skin: Skin is warm and dry. No rash noted. No erythema.  Psychiatric: Patient has a normal mood and affect. behavior is normal. Judgment and thought content normal.     Assessment & Plan  1. Well adult exam (Primary)    -Prostate cancer screening and PSA options (with potential risks and benefits of testing vs not testing) were discussed along with recent recs/guidelines. -USPSTF grade A and B recommendations reviewed with patient; age-appropriate recommendations, preventive care, screening tests, etc discussed and encouraged; healthy living encouraged; see AVS for patient education given to patient -Discussed importance of 150 minutes of physical activity weekly, eat two servings of fish weekly, eat one serving of tree nuts ( cashews, pistachios, pecans, almonds.SABRA) every other day, eat 6 servings of fruit/vegetables daily and drink plenty of water  and avoid sweet beverages.  -Reviewed Health Maintenance: yes

## 2024-01-07 NOTE — Patient Instructions (Signed)
 Preventive Care 73 Years and Older, Male Preventive care refers to lifestyle choices and visits with your health care provider that can promote health and wellness. Preventive care visits are also called wellness exams. What can I expect for my preventive care visit? Counseling During your preventive care visit, your health care provider may ask about your: Medical history, including: Past medical problems. Family medical history. History of falls. Current health, including: Emotional well-being. Home life and relationship well-being. Sexual activity. Memory and ability to understand (cognition). Lifestyle, including: Alcohol, nicotine or tobacco, and drug use. Access to firearms. Diet, exercise, and sleep habits. Work and work Astronomer. Sunscreen use. Safety issues such as seatbelt and bike helmet use. Physical exam Your health care provider will check your: Height and weight. These may be used to calculate your BMI (body mass index). BMI is a measurement that tells if you are at a healthy weight. Waist circumference. This measures the distance around your waistline. This measurement also tells if you are at a healthy weight and may help predict your risk of certain diseases, such as type 2 diabetes and high blood pressure. Heart rate and blood pressure. Body temperature. Skin for abnormal spots. What immunizations do I need?  Vaccines are usually given at various ages, according to a schedule. Your health care provider will recommend vaccines for you based on your age, medical history, and lifestyle or other factors, such as travel or where you work. What tests do I need? Screening Your health care provider may recommend screening tests for certain conditions. This may include: Lipid and cholesterol levels. Diabetes screening. This is done by checking your blood sugar (glucose) after you have not eaten for a while (fasting). Hepatitis C test. Hepatitis B test. HIV (human  immunodeficiency virus) test. STI (sexually transmitted infection) testing, if you are at risk. Lung cancer screening. Colorectal cancer screening. Prostate cancer screening. Abdominal aortic aneurysm (AAA) screening. You may need this if you are a current or former smoker. Talk with your health care provider about your test results, treatment options, and if necessary, the need for more tests. Follow these instructions at home: Eating and drinking  Eat a diet that includes fresh fruits and vegetables, whole grains, lean protein, and low-fat dairy products. Limit your intake of foods with high amounts of sugar, saturated fats, and salt. Take vitamin and mineral supplements as recommended by your health care provider. Do not drink alcohol if your health care provider tells you not to drink. If you drink alcohol: Limit how much you have to 0-2 drinks a day. Know how much alcohol is in your drink. In the [place] one drink equals one 12 oz bottle of beer (355 mL), one 5 oz glass of wine (148 mL), or one 1 oz glass of hard liquor (44 mL). Lifestyle Brush your teeth every morning and night with fluoride toothpaste. Floss one time each day. Exercise for at least 30 minutes 5 or more days each week. Do not use any products that contain nicotine or tobacco. These products include cigarettes, chewing tobacco, and vaping devices, such as e-cigarettes. If you need help quitting, ask your health care provider. Do not use drugs. If you are sexually active, practice safe sex. Use a condom or other form of protection to prevent STIs. Take aspirin only as told by your health care provider. Make sure that you understand how much to take and what form to take. Work with your health care provider to find out whether it is safe  and beneficial for you to take aspirin daily. Ask your health care provider if you need to take a cholesterol-lowering medicine (statin). Find healthy ways to manage stress, such  as: Meditation, yoga, or listening to music. Journaling. Talking to a trusted person. Spending time with friends and family. Safety Always wear your seat belt while driving or riding in a vehicle. Do not drive: If you have been drinking alcohol. Do not ride with someone who has been drinking. When you are tired or distracted. While texting. If you have been using any mind-altering substances or drugs. Wear a helmet and other protective equipment during sports activities. If you have firearms in your house, make sure you follow all gun safety procedures. Minimize exposure to UV radiation to reduce your risk of skin cancer. What's next? Visit your health care provider once a year for an annual wellness visit. Ask your health care provider how often you should have your eyes and teeth checked. Stay up to date on all vaccines. This information is not intended to replace advice given to you by your health care provider. Make sure you discuss any questions you have with your health care provider. Document Revised: 11/10/2020 Document Reviewed: 11/10/2020 Elsevier Patient Education  2024 [employer].

## 2024-01-10 ENCOUNTER — Other Ambulatory Visit: Payer: Self-pay

## 2024-01-25 DIAGNOSIS — G4733 Obstructive sleep apnea (adult) (pediatric): Secondary | ICD-10-CM | POA: Diagnosis not present

## 2024-02-07 ENCOUNTER — Ambulatory Visit: Payer: PPO

## 2024-02-07 DIAGNOSIS — Z0000 Encounter for general adult medical examination without abnormal findings: Secondary | ICD-10-CM | POA: Diagnosis not present

## 2024-02-07 NOTE — Progress Notes (Signed)
 Subjective:   Dustin Cobb is a 78 y.o. who presents for a Medicare Wellness preventive visit.  As a reminder, Annual Wellness Visits don't include a physical exam, and some assessments may be limited, especially if this visit is performed virtually. We may recommend an in-person follow-up visit with your provider if needed.  Visit Complete: Virtual I connected with  Dustin Cobb on 02/07/24 by a audio enabled telemedicine application and verified that I am speaking with the correct person using two identifiers.  Patient Location: Home  Provider Location: Home Office  I discussed the limitations of evaluation and management by telemedicine. The patient expressed understanding and agreed to proceed.  Vital Signs: Because this visit was a virtual/telehealth visit, some criteria may be missing or patient reported. Any vitals not documented were not able to be obtained and vitals that have been documented are patient reported.  VideoDeclined- This patient declined Librarian, academic. Therefore the visit was completed with audio only.  Persons Participating in Visit: Patient.  AWV Questionnaire: No: Patient Medicare AWV questionnaire was not completed prior to this visit.  Cardiac Risk Factors include: advanced age (>49men, >71 women);dyslipidemia;hypertension;male gender;obesity (BMI >30kg/m2)     Objective:    There were no vitals filed for this visit. There is no height or weight on file to calculate BMI.     02/07/2024    2:04 PM 08/25/2022   10:34 AM 01/09/2022    8:08 AM 08/23/2021    1:47 PM 08/17/2020    1:37 PM 08/14/2019    1:50 PM 08/13/2018    1:41 PM  Advanced Directives  Does Patient Have a Medical Advance Directive? No No No No No No No   Would patient like information on creating a medical advance directive? No - Patient declined  No - Patient declined No - Patient declined Yes (MAU/Ambulatory/Procedural Areas - Information given)  No - Patient declined Yes (MAU/Ambulatory/Procedural Areas - Information given)      Data saved with a previous flowsheet row definition    Current Medications (verified) Outpatient Encounter Medications as of 02/07/2024  Medication Sig   augmented betamethasone  dipropionate (DIPROLENE -AF) 0.05 % cream 1(one) application(s) topical 2(two) times a day   Cholecalciferol (VITAMIN D3) 1000 UNITS CAPS Take 1 capsule by mouth daily.   ferrous sulfate 325 (65 FE) MG tablet Take 1 tablet by mouth daily.   fexofenadine (ALLEGRA) 180 MG tablet Take 180 mg by mouth daily.   fluticasone  (FLONASE ) 50 MCG/ACT nasal spray Place 2 sprays into both nostrils daily.   losartan  (COZAAR ) 50 MG tablet Take 1 tablet (50 mg total) by mouth daily.   montelukast  (SINGULAIR ) 10 MG tablet Take 1 tablet (10 mg total) by mouth at bedtime.   pantoprazole  (PROTONIX ) 40 MG tablet Take 1 tablet (40 mg total) by mouth daily.   rosuvastatin  (CRESTOR ) 40 MG tablet Take 1 tablet (40 mg total) by mouth daily.   tadalafil  (CIALIS ) 10 MG tablet Take 1 tablet (10 mg total) by mouth every other day as needed for erectile dysfunction.   temazepam  (RESTORIL ) 15 MG capsule Take 1 capsule (15 mg total) by mouth at bedtime as needed for sleep.   augmented betamethasone  dipropionate (DIPROLENE -AF) 0.05 % cream Apply 1 Application topically 2 (two) times daily.   No facility-administered encounter medications on file as of 02/07/2024.    Allergies (verified) Patient has no known allergies.   History: Past Medical History:  Diagnosis Date   Aching headache  Allergy    ED (erectile dysfunction)    GERD (gastroesophageal reflux disease)    Hyperlipidemia    Hypertension    Insomnia    Metabolic syndrome    Onychomycosis    OSA on CPAP    Postural dizziness    Rash    Sleep apnea    Wears partial dentures    Past Surgical History:  Procedure Laterality Date   CARDIAC CATHETERIZATION     COLONOSCOPY     COLONOSCOPY WITH  PROPOFOL  N/A 12/03/2020   Procedure: COLONOSCOPY WITH PROPOFOL ;  Surgeon: Jinny Carmine, MD;  Location: [facility];  Service: Endoscopy;  Laterality: N/A;   ESOPHAGOGASTRODUODENOSCOPY (EGD) WITH PROPOFOL  N/A 01/09/2022   Procedure: ESOPHAGOGASTRODUODENOSCOPY (EGD) WITH PROPOFOL ;  Surgeon: Jinny Carmine, MD;  Location: [facility];  Service: Endoscopy;  Laterality: N/A;   HEMORRHOID BANDING  04/01/2012   Dr. Lovenia   Family History  Problem Relation Age of Onset   Cancer Mother        Thyroid    Emphysema Father    COPD Father    Hypertension Sister    Hypertension Brother    Heart disease Brother    Healthy Daughter    Social History   Socioeconomic History   Marital status: Married    Spouse name: Mary   Number of children: 2   Years of education: Not on file   Highest education level: Associate degree: academic program  Occupational History   Occupation: retired  Tobacco Use   Smoking status: Former    Current packs/day: 0.00    Average packs/day: 1.3 packs/day for 30.0 years (40.0 ttl pk-yrs)    Types: Cigarettes    Start date: 01/07/1975    Quit date: 01/07/1995    Years since quitting: 29.1   Smokeless tobacco: Never   Tobacco comments:    smoking cessation materials not required  Vaping Use   Vaping status: Never Used  Substance and Sexual Activity   Alcohol use: No   Drug use: No   Sexual activity: Yes    Partners: Female    Birth control/protection: None  Other Topics Concern   Not on file  Social History Narrative   Not on file   Social Drivers of Health   Financial Resource Strain: Low Risk  (02/07/2024)   Overall Financial Resource Strain (CARDIA)    Difficulty of Paying Living Expenses: Not hard at all  Food Insecurity: No Food Insecurity (02/07/2024)   Hunger Vital Sign    Worried About Running Out of Food in the Last Year: Never true    Ran Out of Food in the Last Year: Never true  Transportation Needs: No Transportation Needs (02/07/2024)    PRAPARE - Administrator, Civil Service (Medical): No    Lack of Transportation (Non-Medical): No  Physical Activity: Sufficiently Active (02/07/2024)   Exercise Vital Sign    Days of Exercise per Week: 3 days    Minutes of Exercise per Session: 60 min  Stress: No Stress Concern Present (02/07/2024)   [employer] of Occupational Health - Occupational Stress Questionnaire    Feeling of Stress: Not at all  Social Connections: Moderately Integrated (02/07/2024)   Social Connection and Isolation Panel    Frequency of Communication with Friends and Family: More than three times a week    Frequency of Social Gatherings with Friends and Family: Twice a week    Attends Religious Services: More than 4 times per year    Active  Member of Clubs or Organizations: No    Attends Engineer, structural: Never    Marital Status: Married    Tobacco Counseling Counseling given: Not Answered Tobacco comments: smoking cessation materials not required    Clinical Intake:  Pre-visit preparation completed: Yes  Pain : No/denies pain     BMI - recorded: 32.8 Nutritional Status: BMI > 30  Obese Nutritional Risks: None Diabetes: No  Lab Results  Component Value Date   HGBA1C 6.1 (H) 02/15/2023   HGBA1C 5.9 (H) 02/24/2022   HGBA1C 5.7 (H) 02/23/2021     How often do you need to have someone help you when you read instructions, pamphlets, or other written materials from your doctor or pharmacy?: 1 - Never  Interpreter Needed?: No  Information entered by :: Dustin DAS, LPN   Activities of Daily Living    02/07/2024    2:06 PM 02/04/2024    9:19 PM  In your present state of health, do you have any difficulty performing the following activities:  Hearing? 1 0  Vision? 0 0  Difficulty concentrating or making decisions? 0 0  Walking or climbing stairs? 0 0  Dressing or bathing? 0 0  Doing errands, shopping? 0 0  Preparing Food and eating ? N N  Using the  Toilet? N N  In the past six months, have you accidently leaked urine? N N  Do you have problems with loss of bowel control? N N  Managing your Medications? N N  Managing your Finances? N N  Housekeeping or managing your Housekeeping? N N    Patient Care Team: Sowles, Krichna, MD as PCP - General (Family Medicine) Bluford Standing, MD as Consulting Physician (Neurosurgery) Jinny Carmine, MD as Consulting Physician (Gastroenterology) Cathlyn Seal, MD (Dermatology) Fernand Elfreda LABOR, MD as Consulting Physician (Internal Medicine) Herminio Miu, MD (Otolaryngology) [facility], [facility]  I have updated your Care Teams any recent Medical Services you may have received from other providers in the past year.     Assessment:   This is a routine wellness examination for Dustin Cobb.  Hearing/Vision screen Hearing Screening - Comments:: WEARS AIDS, BOTH EARS Vision Screening - Comments:: READERS, DRIVING- [place]- SEEN EVERY 6 MONTHS   Goals Addressed             This Visit's Progress    DIET - EAT MORE FRUITS AND VEGETABLES         Depression Screen     02/07/2024    2:03 PM 01/07/2024   10:08 AM 08/17/2023    8:18 AM 12/28/2022    8:06 AM 08/24/2022    8:52 AM 02/24/2022    9:46 AM 08/23/2021    1:46 PM  PHQ 2/9 Scores  PHQ - 2 Score 0 0 0 0 1 0 0  PHQ- 9 Score 0  0 2 6 0 0    Fall Risk     02/07/2024    2:05 PM 02/04/2024    9:19 PM 01/07/2024   10:08 AM 08/17/2023    8:18 AM 02/15/2023    8:03 AM  Fall Risk   Falls in the past year? 0 0 0 0 0  Number falls in past yr: 0 0 0 0   Injury with Fall? 0 0 0 0   Risk for fall due to : No Fall Risks  No Fall Risks No Fall Risks No Fall Risks  Follow up Falls evaluation completed;Falls prevention discussed  Falls evaluation completed Falls prevention  discussed;Education provided;Falls evaluation completed Falls prevention discussed    MEDICARE RISK AT HOME:  Medicare Risk at Home Any stairs in or around the home?: Yes If  so, are there any without handrails?: No Home free of loose throw rugs in walkways, pet beds, electrical cords, etc?: No Adequate lighting in your home to reduce risk of falls?: Yes Life alert?: No Use of a cane, walker or w/c?: No Grab bars in the bathroom?: No Shower chair or bench in shower?: No Elevated toilet seat or a handicapped toilet?: No  TIMED UP AND GO:  Was the test performed?  No  Cognitive Function: 6CIT completed        02/07/2024    2:07 PM 08/25/2022   10:34 AM 08/13/2018    1:46 PM 08/07/2017    2:02 PM  6CIT Screen  What Year? 0 points 0 points 0 points 0 points  What month? 0 points 0 points 0 points 0 points  What time? 0 points 0 points 0 points 0 points  Count back from 20 0 points 0 points 0 points 0 points  Months in reverse 0 points 0 points 0 points 0 points  Repeat phrase 0 points 0 points 2 points 4 points  Total Score 0 points 0 points 2 points 4 points    Immunizations Immunization History  Administered Date(s) Administered   Fluad Quad(high Dose 65+) 02/12/2019, 02/23/2021, 02/24/2022   INFLUENZA, HIGH DOSE SEASONAL PF 04/11/2016, 03/05/2017, 02/13/2018   Influenza, Seasonal, Injecte, Preservative Fre 02/15/2023   Influenza,inj,Quad PF,6+ Mos 01/07/2015   Influenza-Unspecified 03/02/2014, 02/18/2020   Moderna SARS-COV2 Booster Vaccination 05/04/2020   Moderna Sars-Covid-2 Vaccination 07/21/2019, 08/19/2019   Pneumococcal Conjugate-13 01/07/2015   Pneumococcal Polysaccharide-23 12/18/2013   Respiratory Syncytial Virus Vaccine ,Recomb Aduvanted(Arexvy ) 12/28/2022   Tdap 01/29/2009, 08/23/2020   Zoster Recombinant(Shingrix ) 12/28/2022, 08/17/2023   Zoster, Live 07/22/2015    Screening Tests Health Maintenance  Topic Date Due   COVID-19 Vaccine (3 - Moderna risk series) 06/01/2020   Influenza Vaccine  12/28/2023   Medicare Annual Wellness (AWV)  02/06/2025   DTaP/Tdap/Td (3 - Td or Tdap) 08/24/2030   Pneumococcal Vaccine: 50+ Years   Completed   Hepatitis C Screening  Completed   Zoster Vaccines- Shingrix   Completed   HPV VACCINES  Aged Out   Meningococcal B Vaccine  Aged Out   Colonoscopy  Discontinued    Health Maintenance Items Addressed: NEEDS COVID & PNA SHOTS; AGED OUT OF COLONOSCOPY  Additional Screening:  Vision Screening: Recommended annual ophthalmology exams for early detection of glaucoma and other disorders of the eye. Is the patient up to date with their annual eye exam?  Yes  Who is the provider or what is the name of the office in which the patient attends annual eye exams? [facility]  Dental Screening: Recommended annual dental exams for proper oral hygiene  Community Resource Referral / Chronic Care Management: CRR required this visit?  No   CCM required this visit?  No   Plan:    I have personally reviewed and noted the following in the patient's chart:   Medical and social history Use of alcohol, tobacco or illicit drugs  Current medications and supplements including opioid prescriptions. Patient is not currently taking opioid prescriptions. Functional ability and status Nutritional status Physical activity Advanced directives List of other physicians Hospitalizations, surgeries, and ER visits in previous 12 months Vitals Screenings to include cognitive, depression, and falls Referrals and appointments  In addition, I have reviewed and discussed with  patient certain preventive protocols, quality metrics, and best practice recommendations. A written personalized care plan for preventive services as well as general preventive health recommendations were provided to patient.   Dustin GORMAN Das, LPN   [id number]   After Visit Summary: (MyChart) Due to this being a telephonic visit, the after visit summary with patients personalized plan was offered to patient via MyChart   Notes: Nothing significant to report at this time.

## 2024-02-07 NOTE — Patient Instructions (Addendum)
 [name],  Thank you for taking the time for your Medicare Wellness Visit. I appreciate your continued commitment to your health goals. Please review the care plan we discussed, and feel free to reach out if I can assist you further.  Medicare recommends these wellness visits once per year to help you and your care team stay ahead of potential health issues. These visits are designed to focus on prevention, allowing your provider to concentrate on managing your acute and chronic conditions during your regular appointments.  Please note that Annual Wellness Visits do not include a physical exam. Some assessments may be limited, especially if the visit was conducted virtually. If needed, we may recommend a separate in-person follow-up with your provider.  Ongoing Care Seeing your primary care provider every 3 to 6 months helps us  monitor your health and provide consistent, personalized care.   Referrals If a referral was made during today's visit and you haven't received any updates within two weeks, please contact the referred provider directly to check on the status.  Recommended Screenings:  Health Maintenance  Topic Date Due   COVID-19 Vaccine (3 - Moderna risk series) 06/01/2020   Flu Shot  12/28/2023   Medicare Annual Wellness Visit  02/06/2025   DTaP/Tdap/Td vaccine (3 - Td or Tdap) 08/24/2030   Pneumococcal Vaccine for age over 84  Completed   Hepatitis C Screening  Completed   Zoster (Shingles) Vaccine  Completed   HPV Vaccine  Aged Out   Meningitis B Vaccine  Aged Out   Colon Cancer Screening  Discontinued     Advance Care Planning is important because it: Ensures you receive medical care that aligns with your values, goals, and preferences. Provides guidance to your family and loved ones, reducing the emotional burden of decision-making during critical moments.  Vision: Annual vision screenings are recommended for early detection of glaucoma, cataracts, and diabetic  retinopathy. These exams can also reveal signs of chronic conditions such as diabetes and high blood pressure.  Dental: Annual dental screenings help detect early signs of oral cancer, gum disease, and other conditions linked to overall health, including heart disease and diabetes.  Please see the attached documents for additional preventive care recommendations.    NEXT AWV 02/12/25 @ 3:20 PM BY PHONE

## 2024-02-11 ENCOUNTER — Other Ambulatory Visit (HOSPITAL_COMMUNITY): Payer: Self-pay

## 2024-02-18 ENCOUNTER — Other Ambulatory Visit: Payer: Self-pay

## 2024-02-18 ENCOUNTER — Ambulatory Visit: Admitting: Family Medicine

## 2024-02-18 ENCOUNTER — Encounter: Payer: Self-pay | Admitting: Family Medicine

## 2024-02-18 VITALS — BP 114/74 | HR 76 | Resp 16 | Ht 66.0 in | Wt 208.4 lb

## 2024-02-18 DIAGNOSIS — I10 Essential (primary) hypertension: Secondary | ICD-10-CM

## 2024-02-18 DIAGNOSIS — D352 Benign neoplasm of pituitary gland: Secondary | ICD-10-CM

## 2024-02-18 DIAGNOSIS — I719 Aortic aneurysm of unspecified site, without rupture: Secondary | ICD-10-CM | POA: Diagnosis not present

## 2024-02-18 DIAGNOSIS — E559 Vitamin D deficiency, unspecified: Secondary | ICD-10-CM

## 2024-02-18 DIAGNOSIS — E0590 Thyrotoxicosis, unspecified without thyrotoxic crisis or storm: Secondary | ICD-10-CM | POA: Diagnosis not present

## 2024-02-18 DIAGNOSIS — K219 Gastro-esophageal reflux disease without esophagitis: Secondary | ICD-10-CM | POA: Diagnosis not present

## 2024-02-18 DIAGNOSIS — R739 Hyperglycemia, unspecified: Secondary | ICD-10-CM

## 2024-02-18 DIAGNOSIS — E785 Hyperlipidemia, unspecified: Secondary | ICD-10-CM | POA: Diagnosis not present

## 2024-02-18 DIAGNOSIS — G2581 Restless legs syndrome: Secondary | ICD-10-CM

## 2024-02-18 DIAGNOSIS — J302 Other seasonal allergic rhinitis: Secondary | ICD-10-CM | POA: Diagnosis not present

## 2024-02-18 DIAGNOSIS — F5101 Primary insomnia: Secondary | ICD-10-CM

## 2024-02-18 DIAGNOSIS — J3089 Other allergic rhinitis: Secondary | ICD-10-CM | POA: Diagnosis not present

## 2024-02-18 DIAGNOSIS — Z23 Encounter for immunization: Secondary | ICD-10-CM

## 2024-02-18 MED ORDER — MONTELUKAST SODIUM 10 MG PO TABS
10.0000 mg | ORAL_TABLET | Freq: Every day | ORAL | 1 refills | Status: AC
  Filled 2024-02-18 – 2024-03-25 (×2): qty 90, 90d supply, fill #0
  Filled 2024-06-27: qty 90, 90d supply, fill #1

## 2024-02-18 MED ORDER — LOSARTAN POTASSIUM 50 MG PO TABS
50.0000 mg | ORAL_TABLET | Freq: Every day | ORAL | 1 refills | Status: AC
  Filled 2024-02-18 – 2024-03-25 (×2): qty 90, 90d supply, fill #0
  Filled 2024-06-27: qty 90, 90d supply, fill #1

## 2024-02-18 MED ORDER — ROSUVASTATIN CALCIUM 40 MG PO TABS
40.0000 mg | ORAL_TABLET | Freq: Every day | ORAL | 1 refills | Status: AC
  Filled 2024-02-18 – 2024-04-06 (×2): qty 90, 90d supply, fill #0

## 2024-02-18 MED ORDER — TEMAZEPAM 15 MG PO CAPS
15.0000 mg | ORAL_CAPSULE | Freq: Every evening | ORAL | 1 refills | Status: AC | PRN
  Filled 2024-02-18 – 2024-03-31 (×2): qty 90, 90d supply, fill #0
  Filled 2024-06-27: qty 90, 90d supply, fill #1

## 2024-02-18 MED ORDER — FAMOTIDINE 40 MG PO TABS
40.0000 mg | ORAL_TABLET | Freq: Every day | ORAL | 1 refills | Status: AC
  Filled 2024-02-18: qty 90, 90d supply, fill #0
  Filled 2024-05-12: qty 90, 90d supply, fill #1

## 2024-02-18 MED ORDER — AZELASTINE HCL 0.1 % NA SOLN
2.0000 | Freq: Two times a day (BID) | NASAL | 1 refills | Status: AC
  Filled 2024-02-18: qty 90, 90d supply, fill #0

## 2024-02-18 MED ORDER — PANTOPRAZOLE SODIUM 40 MG PO TBEC
40.0000 mg | DELAYED_RELEASE_TABLET | Freq: Every day | ORAL | 1 refills | Status: AC
  Filled 2024-02-18 – 2024-04-06 (×2): qty 90, 90d supply, fill #0

## 2024-02-18 NOTE — Progress Notes (Signed)
 Name: Dustin Cobb   MRN: [id number]    DOB: 12-18-1945   Date:02/18/2024       Progress Note  Subjective  Chief Complaint  Chief Complaint  Patient presents with   Medical Management of Chronic Issues   Discussed the use of AI scribe software for clinical note transcription with the patient, who gave verbal consent to proceed.  History of Present Illness Dustin Cobb is a 78 year old male who presents for a routine follow-up.  He has hypertension and takes losartan  50 mg daily. No chest pain or palpitations are present, and he has no issues with his current medication regimen.  For hyperlipidemia, he is taking rosuvastatin  and is due for a refill. He denies experiencing muscle aches associated with the medication.  Regarding GERD, he takes pantoprazole  once daily and supplements with over-the-counter Pepcid  in the evening due to prescription limitations. He experiences periods of good control and bad control of symptoms.  He uses a BiPAP machine for sleep apnea, wearing it for at least 5-6 hours nightly. He takes temazepam  to help fall asleep and generally sleeps through the night.  He has a history of elevated A1c, with a previous value of 6.1. No symptoms of diabetes such as increased hunger, thirst, or urination are present. He has reduced sugary beverage intake but admits to occasional consumption.  He has a history of subclinical hyperthyroidism and was released from endocrinology care. He does not wish to continue regular thyroid  monitoring.  He has a pituitary microadenoma, last monitored by MRI in 2023, and reports no headaches or vision changes. He is under the care of a neurosurgeon.  He has a history of aortic dilation, last measured at 3.12 cm, and is under the care of a vascular doctor. He takes statins and blood pressure medication as part of his management plan.  He experiences restless leg symptoms, particularly at night, and has a history of iron deficiency  anemia. He denies current hemorrhoidal bleeding.  He takes Singulair , Allegra, and Flonase  for allergies, experiencing runny nose and congestion without fever or chills.  He reports a slight weight gain from 203 to 208 pounds over the past year and continues to take over-the-counter vitamin D .    Patient Active Problem List   Diagnosis Date Noted   Restless legs syndrome 04/23/2023   Gastritis without bleeding    Aortic aneurysm without rupture (HCC) 08/23/2021   Pituitary microadenoma (HCC) 08/23/2021   Abdominal aortic aneurysm (AAA) without rupture (HCC) 08/23/2021   OSA treated with BiPAP 07/11/2021   Encounter for BiPAP use counseling 07/11/2021   Encounter for screening colonoscopy    Lichen simplex chronicus 02/13/2018   Rash of hands 08/10/2016   Hypertension, essential, benign 01/07/2015   GERD (gastroesophageal reflux disease) 01/07/2015   OSA on CPAP 01/07/2015   Low TSH level 01/07/2015   Hyperglycemia 01/07/2015   Obesity (BMI 30.0-34.9) 01/07/2015   Dyslipidemia 01/07/2015   History of hemorrhoidectomy 01/07/2015   History of iron deficiency anemia 01/07/2015   Allergic rhinitis 01/07/2015   Metabolic syndrome 01/07/2015    Past Surgical History:  Procedure Laterality Date   CARDIAC CATHETERIZATION     COLONOSCOPY     COLONOSCOPY WITH PROPOFOL  N/A 12/03/2020   Procedure: COLONOSCOPY WITH PROPOFOL ;  Surgeon: Jinny Carmine, MD;  Location: [facility];  Service: Endoscopy;  Laterality: N/A;   ESOPHAGOGASTRODUODENOSCOPY (EGD) WITH PROPOFOL  N/A 01/09/2022   Procedure: ESOPHAGOGASTRODUODENOSCOPY (EGD) WITH PROPOFOL ;  Surgeon: Jinny Carmine, MD;  Location: [facility]  ENDOSCOPY;  Service: Endoscopy;  Laterality: N/A;   HEMORRHOID BANDING  04/01/2012   Dr. Lovenia    Family History  Problem Relation Age of Onset   Cancer Mother        Thyroid    Emphysema Father    COPD Father    Hypertension Sister    Hypertension Brother    Heart disease Brother    Healthy  Daughter     Social History   Tobacco Use   Smoking status: Former    Current packs/day: 0.00    Average packs/day: 1.3 packs/day for 30.0 years (40.0 ttl pk-yrs)    Types: Cigarettes    Start date: 01/07/1975    Quit date: 01/07/1995    Years since quitting: 29.1   Smokeless tobacco: Never   Tobacco comments:    smoking cessation materials not required  Substance Use Topics   Alcohol use: No     Current Outpatient Medications:    augmented betamethasone  dipropionate (DIPROLENE -AF) 0.05 % cream, 1(one) application(s) topical 2(two) times a day, Disp: 50 g, Rfl: 1   augmented betamethasone  dipropionate (DIPROLENE -AF) 0.05 % cream, Apply 1 Application topically 2 (two) times daily., Disp: 50 g, Rfl: 1   Cholecalciferol (VITAMIN D3) 1000 UNITS CAPS, Take 1 capsule by mouth daily., Disp: , Rfl:    ferrous sulfate 325 (65 FE) MG tablet, Take 1 tablet by mouth daily., Disp: , Rfl:    fexofenadine (ALLEGRA) 180 MG tablet, Take 180 mg by mouth daily., Disp: , Rfl:    fluticasone  (FLONASE ) 50 MCG/ACT nasal spray, Place 2 sprays into both nostrils daily., Disp: 16 g, Rfl: 5   losartan  (COZAAR ) 50 MG tablet, Take 1 tablet (50 mg total) by mouth daily., Disp: 90 tablet, Rfl: 1   montelukast  (SINGULAIR ) 10 MG tablet, Take 1 tablet (10 mg total) by mouth at bedtime., Disp: 90 tablet, Rfl: 1   pantoprazole  (PROTONIX ) 40 MG tablet, Take 1 tablet (40 mg total) by mouth daily., Disp: 180 tablet, Rfl: 1   rosuvastatin  (CRESTOR ) 40 MG tablet, Take 1 tablet (40 mg total) by mouth daily., Disp: 90 tablet, Rfl: 1   tadalafil  (CIALIS ) 10 MG tablet, Take 1 tablet (10 mg total) by mouth every other day as needed for erectile dysfunction., Disp: 30 tablet, Rfl: 1   temazepam  (RESTORIL ) 15 MG capsule, Take 1 capsule (15 mg total) by mouth at bedtime as needed for sleep., Disp: 90 capsule, Rfl: 1  No Known Allergies  I personally reviewed active problem list, medication list, allergies, family history with the  patient/caregiver today.   ROS  Ten systems reviewed and is negative except as mentioned in HPI    Objective Physical Exam CONSTITUTIONAL: Patient appears well-developed and well-nourished. No distress. HEENT: Head atraumatic, normocephalic, neck supple. CARDIOVASCULAR: Normal rate, regular rhythm and normal heart sounds. No murmur heard. No edema in extremities. PULMONARY: Effort normal and breath sounds normal. Lungs clear to auscultation bilaterally. No respiratory distress. ABDOMINAL: There is no tenderness or distention. MUSCULOSKELETAL: Normal gait. Without gross motor or sensory deficit. PSYCHIATRIC: Patient has a normal mood and affect. Behavior is normal. Judgment and thought content normal.  Vitals:   02/18/24 0829  BP: 114/74  Pulse: 76  Resp: 16  SpO2: 99%  Weight: 208 lb 6.4 oz (94.5 kg)  Height: 5' 6 (1.676 m)    Body mass index is 33.64 kg/m.    PHQ2/9:    02/18/2024    8:25 AM 02/07/2024    2:03 PM 01/07/2024  10:08 AM 08/17/2023    8:18 AM 12/28/2022    8:06 AM  Depression screen PHQ 2/9  Decreased Interest 0 0 0 0 0  Down, Depressed, Hopeless 0 0 0 0 0  PHQ - 2 Score 0 0 0 0 0  Altered sleeping 0 0  0 1  Tired, decreased energy 0 0  0 1  Change in appetite 0 0  0 0  Feeling bad or failure about yourself  0 0  0 0  Trouble concentrating 0 0  0 0  Moving slowly or fidgety/restless 0 0  0 0  Suicidal thoughts 0 0  0 0  PHQ-9 Score 0 0  0 2  Difficult doing work/chores Not difficult at all Not difficult at all  Not difficult at all Not difficult at all    phq 9 is negative  Fall Risk:    02/18/2024    8:25 AM 02/07/2024    2:05 PM 02/04/2024    9:19 PM 01/07/2024   10:08 AM 08/17/2023    8:18 AM  Fall Risk   Falls in the past year? 0 0 0 0 0  Number falls in past yr: 0 0 0 0 0  Injury with Fall? 0 0 0 0 0  Risk for fall due to : No Fall Risks No Fall Risks  No Fall Risks No Fall Risks  Follow up Falls evaluation completed Falls evaluation  completed;Falls prevention discussed  Falls evaluation completed Falls prevention discussed;Education provided;Falls evaluation completed     Assessment & Plan Gastroesophageal reflux disease Symptoms not fully controlled with current regimen. - Send prescription for pantoprazole  180 tablets. - Prescribe famotidine  for bedtime use. - Consider ranitidine  if insurance covers.  Obstructive sleep apnea Good compliance with BiPAP and temazepam  aids sleep. - Continue BiPAP use. - Continue temazepam  as prescribed.  Insomnia Temazepam  effective for sleep. - Continue temazepam  as prescribed.  Restless legs syndrome Symptoms possibly related to iron deficiency. - Check iron levels. - Consider prescribing Requip if iron levels are normal and symptoms persist.  Allergic rhinitis Experiencing runny nose and congestion. - Prescribe azelastine  nasal spray for flare-ups.  Essential hypertension Blood pressure well-controlled with losartan . - Continue losartan  50 mg daily.  Hyperlipidemia Due for lipid panel check. - Order lipid panel. - Refill rosuvastatin  prescription.  Abdominal aortic aneurysm Aneurysm size stable at 3.12 cm. - Continue surveillance with vascular specialist.  Pituitary microadenoma MRI showed 3 mm hypo enhancing focus, asymptomatic. - Continue surveillance with neurosurgeon.  Subclinical hyperthyroidism TSH previously low but normal, no symptoms. - Check TSH levels.  Prediabetes A1c trending up to 6.1, no symptoms. - Encourage reduction of sugary beverages.  Obesity BMI below 35, weight increased to 208 lbs. - Encourage weight loss to below 200 lbs.

## 2024-02-20 ENCOUNTER — Other Ambulatory Visit: Payer: Self-pay

## 2024-02-20 DIAGNOSIS — G2581 Restless legs syndrome: Secondary | ICD-10-CM | POA: Diagnosis not present

## 2024-02-20 DIAGNOSIS — E559 Vitamin D deficiency, unspecified: Secondary | ICD-10-CM | POA: Diagnosis not present

## 2024-02-20 DIAGNOSIS — R739 Hyperglycemia, unspecified: Secondary | ICD-10-CM | POA: Diagnosis not present

## 2024-02-20 DIAGNOSIS — E785 Hyperlipidemia, unspecified: Secondary | ICD-10-CM | POA: Diagnosis not present

## 2024-02-20 DIAGNOSIS — E0590 Thyrotoxicosis, unspecified without thyrotoxic crisis or storm: Secondary | ICD-10-CM | POA: Diagnosis not present

## 2024-02-20 DIAGNOSIS — L280 Lichen simplex chronicus: Secondary | ICD-10-CM | POA: Diagnosis not present

## 2024-02-20 DIAGNOSIS — I10 Essential (primary) hypertension: Secondary | ICD-10-CM | POA: Diagnosis not present

## 2024-02-20 DIAGNOSIS — L2089 Other atopic dermatitis: Secondary | ICD-10-CM | POA: Diagnosis not present

## 2024-02-20 MED ORDER — BETAMETHASONE DIPROPIONATE AUG 0.05 % EX CREA
TOPICAL_CREAM | CUTANEOUS | 1 refills | Status: AC
  Filled 2024-02-20: qty 50, 30d supply, fill #0

## 2024-02-21 ENCOUNTER — Ambulatory Visit: Payer: Self-pay | Admitting: Family Medicine

## 2024-02-21 LAB — VITAMIN D 25 HYDROXY (VIT D DEFICIENCY, FRACTURES): Vit D, 25-Hydroxy: 38 ng/mL (ref 30–100)

## 2024-02-21 LAB — TSH+FREE T4: TSH W/REFLEX TO FT4: 0.37 m[IU]/L — ABNORMAL LOW (ref 0.40–4.50)

## 2024-02-21 LAB — CBC WITH DIFFERENTIAL/PLATELET
Absolute Lymphocytes: 1839 {cells}/uL (ref 850–3900)
Absolute Monocytes: 567 {cells}/uL (ref 200–950)
Basophils Absolute: 32 {cells}/uL (ref 0–200)
Basophils Relative: 0.6 %
Eosinophils Absolute: 122 {cells}/uL (ref 15–500)
Eosinophils Relative: 2.3 %
HCT: 45.8 % (ref 38.5–50.0)
Hemoglobin: 14.6 g/dL (ref 13.2–17.1)
MCH: 27.6 pg (ref 27.0–33.0)
MCHC: 31.9 g/dL — ABNORMAL LOW (ref 32.0–36.0)
MCV: 86.6 fL (ref 80.0–100.0)
MPV: 10 fL (ref 7.5–12.5)
Monocytes Relative: 10.7 %
Neutro Abs: 2740 {cells}/uL (ref 1500–7800)
Neutrophils Relative %: 51.7 %
Platelets: 196 Thousand/uL (ref 140–400)
RBC: 5.29 Million/uL (ref 4.20–5.80)
RDW: 14.1 % (ref 11.0–15.0)
Total Lymphocyte: 34.7 %
WBC: 5.3 Thousand/uL (ref 3.8–10.8)

## 2024-02-21 LAB — COMPREHENSIVE METABOLIC PANEL WITH GFR
AG Ratio: 1.4 (calc) (ref 1.0–2.5)
ALT: 20 U/L (ref 9–46)
AST: 26 U/L (ref 10–35)
Albumin: 4.1 g/dL (ref 3.6–5.1)
Alkaline phosphatase (APISO): 90 U/L (ref 35–144)
BUN: 20 mg/dL (ref 7–25)
CO2: 26 mmol/L (ref 20–32)
Calcium: 9.7 mg/dL (ref 8.6–10.3)
Chloride: 105 mmol/L (ref 98–110)
Creat: 1.2 mg/dL (ref 0.70–1.28)
Globulin: 3 g/dL (ref 1.9–3.7)
Glucose, Bld: 93 mg/dL (ref 65–99)
Potassium: 4.6 mmol/L (ref 3.5–5.3)
Sodium: 137 mmol/L (ref 135–146)
Total Bilirubin: 0.5 mg/dL (ref 0.2–1.2)
Total Protein: 7.1 g/dL (ref 6.1–8.1)
eGFR: 62 mL/min/1.73m2 (ref >=60)

## 2024-02-21 LAB — LIPID PANEL
Cholesterol: 135 mg/dL (ref <200)
HDL: 63 mg/dL (ref >=40)
LDL Cholesterol (Calc): 58 mg/dL
Non-HDL Cholesterol (Calc): 72 mg/dL (ref <130)
Total CHOL/HDL Ratio: 2.1 (calc) (ref <5.0)
Triglycerides: 55 mg/dL (ref <150)

## 2024-02-21 LAB — HEMOGLOBIN A1C
Hgb A1c MFr Bld: 5.9 % — ABNORMAL HIGH (ref <5.7)
Mean Plasma Glucose: 123 mg/dL
eAG (mmol/L): 6.8 mmol/L

## 2024-02-21 LAB — IRON,TIBC AND FERRITIN PANEL
%SAT: 43 % (ref 20–48)
Ferritin: 97 ng/mL (ref 24–380)
Iron: 142 ug/dL (ref 50–180)
TIBC: 328 ug/dL (ref 250–425)

## 2024-02-21 LAB — T4, FREE: Free T4: 1.2 ng/dL (ref 0.8–1.8)

## 2024-02-28 DIAGNOSIS — H35373 Puckering of macula, bilateral: Secondary | ICD-10-CM | POA: Diagnosis not present

## 2024-02-28 DIAGNOSIS — H5203 Hypermetropia, bilateral: Secondary | ICD-10-CM | POA: Diagnosis not present

## 2024-02-28 DIAGNOSIS — H2513 Age-related nuclear cataract, bilateral: Secondary | ICD-10-CM | POA: Diagnosis not present

## 2024-03-06 ENCOUNTER — Other Ambulatory Visit (INDEPENDENT_AMBULATORY_CARE_PROVIDER_SITE_OTHER): Payer: Self-pay | Admitting: Nurse Practitioner

## 2024-03-06 DIAGNOSIS — I714 Abdominal aortic aneurysm, without rupture, unspecified: Secondary | ICD-10-CM

## 2024-03-07 ENCOUNTER — Other Ambulatory Visit (INDEPENDENT_AMBULATORY_CARE_PROVIDER_SITE_OTHER): Payer: PPO

## 2024-03-07 ENCOUNTER — Ambulatory Visit (INDEPENDENT_AMBULATORY_CARE_PROVIDER_SITE_OTHER): Payer: PPO | Admitting: Vascular Surgery

## 2024-03-07 ENCOUNTER — Encounter (INDEPENDENT_AMBULATORY_CARE_PROVIDER_SITE_OTHER): Payer: Self-pay | Admitting: Vascular Surgery

## 2024-03-07 VITALS — BP 122/69 | HR 68 | Resp 18 | Ht 66.0 in | Wt 208.2 lb

## 2024-03-07 DIAGNOSIS — I714 Abdominal aortic aneurysm, without rupture, unspecified: Secondary | ICD-10-CM

## 2024-03-07 DIAGNOSIS — E785 Hyperlipidemia, unspecified: Secondary | ICD-10-CM | POA: Diagnosis not present

## 2024-03-07 DIAGNOSIS — I719 Aortic aneurysm of unspecified site, without rupture: Secondary | ICD-10-CM | POA: Diagnosis not present

## 2024-03-07 DIAGNOSIS — I10 Essential (primary) hypertension: Secondary | ICD-10-CM | POA: Diagnosis not present

## 2024-03-07 NOTE — Assessment & Plan Note (Signed)
 Duplex today shows his abdominal aortic aneurysm sac to have a maximal diameter of 3.13 cm which is unchanged from his previous study 1 year ago.  No role for intervention at this size.  Blood pressure control and remaining tobacco free are important for avoiding aneurysmal growth.  Follow-up in 1 year with duplex.

## 2024-03-07 NOTE — Progress Notes (Signed)
 MRN : [id number]  Dustin Cobb is a 78 y.o. (03/28/1946) male who presents with chief complaint of  Chief Complaint  Patient presents with   Follow-up    1 year follow up AAA  .  History of Present Illness: Patient returns today in follow up of his abdominal aortic aneurysm.  He is doing well today and does not have any aneurysm related symptoms. Specifically, the patient denies new back or abdominal pain, or signs of peripheral embolization.  Duplex today shows his abdominal aortic aneurysm sac to have a maximal diameter of 3.13 cm which is unchanged from his previous study 1 year ago.  Current Outpatient Medications  Medication Sig Dispense Refill   augmented betamethasone  dipropionate (DIPROLENE -AF) 0.05 % cream 1(one) application(s) topical 2(two) times a day 50 g 1   augmented betamethasone  dipropionate (DIPROLENE -AF) 0.05 % cream Apply 1 Application topically 2 (two) times daily. 50 g 1   augmented betamethasone  dipropionate (DIPROLENE -AF) 0.05 % cream Apply topically 2(two) times a day 50 g 1   azelastine  (ASTELIN ) 0.1 % nasal spray Place 2 sprays into both nostrils 2 (two) times daily. Use in each nostril as directed 90 mL 1   Cholecalciferol (VITAMIN D3) 1000 UNITS CAPS Take 1 capsule by mouth daily.     famotidine  (PEPCID ) 40 MG tablet Take 1 tablet (40 mg total) by mouth at bedtime. 90 tablet 1   ferrous sulfate 325 (65 FE) MG tablet Take 1 tablet by mouth daily.     fexofenadine (ALLEGRA) 180 MG tablet Take 180 mg by mouth daily.     fluticasone  (FLONASE ) 50 MCG/ACT nasal spray Place 2 sprays into both nostrils daily. 16 g 5   losartan  (COZAAR ) 50 MG tablet Take 1 tablet (50 mg total) by mouth daily. 90 tablet 1   montelukast  (SINGULAIR ) 10 MG tablet Take 1 tablet (10 mg total) by mouth at bedtime. 90 tablet 1   pantoprazole  (PROTONIX ) 40 MG tablet Take 1 tablet (40 mg total) by mouth daily. 90 tablet 1   rosuvastatin  (CRESTOR ) 40 MG tablet Take 1 tablet (40 mg total) by  mouth daily. 90 tablet 1   tadalafil  (CIALIS ) 10 MG tablet Take 1 tablet (10 mg total) by mouth every other day as needed for erectile dysfunction. 30 tablet 1   temazepam  (RESTORIL ) 15 MG capsule Take 1 capsule (15 mg total) by mouth at bedtime as needed for sleep. 90 capsule 1   No current facility-administered medications for this visit.    Past Medical History:  Diagnosis Date   Aching headache    Allergy    ED (erectile dysfunction)    GERD (gastroesophageal reflux disease)    Hyperlipidemia    Hypertension    Insomnia    Metabolic syndrome    Onychomycosis    OSA on CPAP    Postural dizziness    Rash    Sleep apnea    Wears partial dentures     Past Surgical History:  Procedure Laterality Date   CARDIAC CATHETERIZATION     COLONOSCOPY     COLONOSCOPY WITH PROPOFOL  N/A 12/03/2020   Procedure: COLONOSCOPY WITH PROPOFOL ;  Surgeon: Jinny Carmine, MD;  Location: [facility];  Service: Endoscopy;  Laterality: N/A;   ESOPHAGOGASTRODUODENOSCOPY (EGD) WITH PROPOFOL  N/A 01/09/2022   Procedure: ESOPHAGOGASTRODUODENOSCOPY (EGD) WITH PROPOFOL ;  Surgeon: Jinny Carmine, MD;  Location: [facility];  Service: Endoscopy;  Laterality: N/A;   HEMORRHOID BANDING  04/01/2012   Dr. Lovenia  Social History   Tobacco Use   Smoking status: Former    Current packs/day: 0.00    Average packs/day: 1.3 packs/day for 30.0 years (40.0 ttl pk-yrs)    Types: Cigarettes    Start date: 01/07/1975    Quit date: 01/07/1995    Years since quitting: 29.1   Smokeless tobacco: Never   Tobacco comments:    smoking cessation materials not required  Vaping Use   Vaping status: Never Used  Substance Use Topics   Alcohol use: No   Drug use: No      Family History  Problem Relation Age of Onset   Cancer Mother        Thyroid    Emphysema Father    COPD Father    Hypertension Sister    Hypertension Brother    Heart disease Brother    Healthy Daughter      No Known  Allergies   REVIEW OF SYSTEMS (Negative unless checked)  Constitutional: [] Weight loss  [] Fever  [] Chills Cardiac: [] Chest pain   [] Chest pressure   [] Palpitations   [] Shortness of breath when laying flat   [] Shortness of breath at rest   [] Shortness of breath with exertion. Vascular:  [] Pain in legs with walking   [] Pain in legs at rest   [] Pain in legs when laying flat   [] Claudication   [] Pain in feet when walking  [] Pain in feet at rest  [] Pain in feet when laying flat   [] History of DVT   [] Phlebitis   [] Swelling in legs   [] Varicose veins   [] Non-healing ulcers Pulmonary:   [] Uses home oxygen   [] Productive cough   [] Hemoptysis   [] Wheeze  [] COPD   [] Asthma Neurologic:  [] Dizziness  [] Blackouts   [] Seizures   [] History of stroke   [] History of TIA  [] Aphasia   [] Temporary blindness   [] Dysphagia   [] Weakness or numbness in arms   [] Weakness or numbness in legs Musculoskeletal:  [x] Arthritis   [] Joint swelling   [] Joint pain   [] Low back pain Hematologic:  [] Easy bruising  [] Easy bleeding   [] Hypercoagulable state   [] Anemic   Gastrointestinal:  [] Blood in stool   [] Vomiting blood  [x] Gastroesophageal reflux/heartburn   [] Abdominal pain Genitourinary:  [] Chronic kidney disease   [] Difficult urination  [] Frequent urination  [] Burning with urination   [] Hematuria Skin:  [] Rashes   [] Ulcers   [] Wounds Psychological:  [] History of anxiety   []  History of major depression.  Physical Examination  BP 122/69 (BP Location: Right Arm, Patient Position: Sitting, Cuff Size: Normal)   Pulse 68   Resp 18   Ht 5' 6 (1.676 m)   Wt 208 lb 3.2 oz (94.4 kg)   BMI 33.60 kg/m  Gen:  WD/WN, NAD Head: [place]/AT, No temporalis wasting. Ear/Nose/Throat: Hearing grossly intact, nares w/o erythema or drainage Eyes: Conjunctiva clear. Sclera non-icteric Neck: Supple.  Trachea midline Pulmonary:  Good air movement, no use of accessory muscles.  Cardiac: RRR, no JVD Vascular:  Vessel Right Left  Radial  Palpable Palpable                          PT Palpable Palpable  DP Palpable Palpable   Gastrointestinal: soft, non-tender/non-distended. No guarding/reflex.  Musculoskeletal: M/S 5/5 throughout.  No deformity or atrophy. No edema. Neurologic: Sensation grossly intact in extremities.  Symmetrical.  Speech is fluent.  Psychiatric: Judgment intact, Mood & affect appropriate for pt's clinical situation. Dermatologic: No rashes or  ulcers noted.  No cellulitis or open wounds.      Labs Recent Results (from the past 2160 hours)  VITAMIN D  25 Hydroxy (Vit-D Deficiency, Fractures)     Status: None   Collection Time: 02/20/24  8:42 AM  Result Value Ref Range   Vit D, 25-Hydroxy 38 30 - 100 ng/mL    Comment: Vitamin D  Status         25-OH Vitamin D : . Deficiency:                    <20 ng/mL Insufficiency:             20 - 29 ng/mL Optimal:                 > or = 30 ng/mL . For 25-OH Vitamin D  testing on patients on  D2-supplementation and patients for whom quantitation  of D2 and D3 fractions is required, the [facility]ureD(TM) 25-OH VIT D, (D2,D3), LC/MS/MS is recommended: order  code 07111 (patients >30yrs). . See Note 1 . Note 1 . For additional information, please refer to  http://education.[facility].com/faq/FAQ199  (This link is being provided for informational/ educational purposes only.)   Comprehensive metabolic panel with GFR     Status: None   Collection Time: 02/20/24  8:42 AM  Result Value Ref Range   Glucose, Bld 93 65 - 99 mg/dL    Comment: .            Fasting reference interval .    BUN 20 7 - 25 mg/dL   Creat 8.79 9.29 - 8.71 mg/dL   eGFR 62 > OR = 60 fO/fpw/8.26f7   BUN/Creatinine Ratio SEE NOTE: 6 - 22 (calc)    Comment:    Not Reported: BUN and Creatinine are within    reference range. .    Sodium 137 135 - 146 mmol/L   Potassium 4.6 3.5 - 5.3 mmol/L   Chloride 105 98 - 110 mmol/L   CO2 26 20 - 32 mmol/L   Calcium  9.7 8.6 - 10.3 mg/dL    Total Protein 7.1 6.1 - 8.1 g/dL   Albumin 4.1 3.6 - 5.1 g/dL   Globulin 3.0 1.9 - 3.7 g/dL (calc)   AG Ratio 1.4 1.0 - 2.5 (calc)   Total Bilirubin 0.5 0.2 - 1.2 mg/dL   Alkaline phosphatase (APISO) 90 35 - 144 U/L   AST 26 10 - 35 U/L   ALT 20 9 - 46 U/L  CBC with Differential/Platelet     Status: Abnormal   Collection Time: 02/20/24  8:42 AM  Result Value Ref Range   WBC 5.3 3.8 - 10.8 Thousand/uL   RBC 5.29 4.20 - 5.80 Million/uL   Hemoglobin 14.6 13.2 - 17.1 g/dL   HCT 54.1 61.4 - 49.9 %   MCV 86.6 80.0 - 100.0 fL   MCH 27.6 27.0 - 33.0 pg   MCHC 31.9 (L) 32.0 - 36.0 g/dL    Comment: For adults, a slight decrease in the calculated MCHC value (in the range of 30 to 32 g/dL) is most likely not clinically significant; however, it should be interpreted with caution in correlation with other red cell parameters and the patient's clinical condition.    RDW 14.1 11.0 - 15.0 %   Platelets 196 140 - 400 Thousand/uL   MPV 10.0 7.5 - 12.5 fL   Neutro Abs 2,740 1,500 - 7,800 cells/uL   Absolute Lymphocytes 1,839 850 - 3,900 cells/uL   Absolute  Monocytes 567 200 - 950 cells/uL   Eosinophils Absolute 122 15 - 500 cells/uL   Basophils Absolute 32 0 - 200 cells/uL   Neutrophils Relative % 51.7 %   Total Lymphocyte 34.7 %   Monocytes Relative 10.7 %   Eosinophils Relative 2.3 %   Basophils Relative 0.6 %  Lipid panel     Status: None   Collection Time: 02/20/24  8:42 AM  Result Value Ref Range   Cholesterol 135 <200 mg/dL   HDL 63 > OR = 40 mg/dL   Triglycerides 55 <849 mg/dL   LDL Cholesterol (Calc) 58 mg/dL (calc)    Comment: Reference range: <100 . Desirable range <100 mg/dL for primary prevention;   <70 mg/dL for patients with CHD or diabetic patients  with > or = 2 CHD risk factors. SABRA LDL-C is now calculated using the Martin-Hopkins  calculation, which is a validated novel method providing  better accuracy than the Friedewald equation in the  estimation of LDL-C.   Gladis APPLETHWAITE et al. SANDREA. 7986;689(80): 2061-2068  (http://education.[facility].com/faq/FAQ164)    Total CHOL/HDL Ratio 2.1 <5.0 (calc)   Non-HDL Cholesterol (Calc) 72 <869 mg/dL (calc)    Comment: For patients with diabetes plus 1 major ASCVD risk  factor, treating to a non-HDL-C goal of <100 mg/dL  (LDL-C of <29 mg/dL) is considered a therapeutic  option.   Hemoglobin A1c     Status: Abnormal   Collection Time: 02/20/24  8:42 AM  Result Value Ref Range   Hgb A1c MFr Bld 5.9 (H) <5.7 %    Comment: For someone without known diabetes, a hemoglobin  A1c value between 5.7% and 6.4% is consistent with prediabetes and should be confirmed with a  follow-up test. . For someone with known diabetes, a value <7% indicates that their diabetes is well controlled. A1c targets should be individualized based on duration of diabetes, age, comorbid conditions, and other considerations. . This assay result is consistent with an increased risk of diabetes. . Currently, no consensus exists regarding use of hemoglobin A1c for diagnosis of diabetes for children. .    Mean Plasma Glucose 123 mg/dL   eAG (mmol/L) 6.8 mmol/L  TSH + free T4     Status: Abnormal   Collection Time: 02/20/24  8:42 AM  Result Value Ref Range   TSH W/REFLEX TO FT4 0.37 (L) 0.40 - 4.50 mIU/L  Iron, TIBC and Ferritin Panel     Status: None   Collection Time: 02/20/24  8:42 AM  Result Value Ref Range   Iron 142 50 - 180 mcg/dL   TIBC 671 749 - 574 mcg/dL (calc)   %SAT 43 20 - 48 % (calc)   Ferritin 97 24 - 380 ng/mL  T4, free     Status: None   Collection Time: 02/20/24  8:42 AM  Result Value Ref Range   Free T4 1.2 0.8 - 1.8 ng/dL    Radiology No results found.  Assessment/Plan  Abdominal aortic aneurysm (AAA) without rupture Duplex today shows his abdominal aortic aneurysm sac to have a maximal diameter of 3.13 cm which is unchanged from his previous study 1 year ago.  No role for intervention at this  size.  Blood pressure control and remaining tobacco free are important for avoiding aneurysmal growth.  Follow-up in 1 year with duplex.  Hypertension, essential, benign blood pressure control important in reducing the progression of atherosclerotic disease and aneurysmal growth. On appropriate oral medications.     Dyslipidemia lipid control  important in reducing the progression of atherosclerotic disease. Continue statin therapy  Selinda Gu, MD  03/07/2024 10:05 AM    This note was created with Dragon medical transcription system.  Any errors from dictation are purely unintentional

## 2024-03-24 DIAGNOSIS — H903 Sensorineural hearing loss, bilateral: Secondary | ICD-10-CM | POA: Diagnosis not present

## 2024-03-25 ENCOUNTER — Other Ambulatory Visit: Payer: Self-pay

## 2024-03-31 ENCOUNTER — Other Ambulatory Visit: Payer: Self-pay

## 2024-04-01 ENCOUNTER — Other Ambulatory Visit: Payer: Self-pay

## 2024-04-07 ENCOUNTER — Other Ambulatory Visit: Payer: Self-pay

## 2024-08-18 ENCOUNTER — Ambulatory Visit: Admitting: Family Medicine

## 2025-01-09 ENCOUNTER — Encounter: Admitting: Family Medicine

## 2025-02-12 ENCOUNTER — Ambulatory Visit

## 2025-03-06 ENCOUNTER — Ambulatory Visit (INDEPENDENT_AMBULATORY_CARE_PROVIDER_SITE_OTHER): Admitting: Vascular Surgery

## 2025-03-06 ENCOUNTER — Other Ambulatory Visit (INDEPENDENT_AMBULATORY_CARE_PROVIDER_SITE_OTHER)
